# Patient Record
Sex: Male | Born: 1950 | Race: White | Hispanic: No | Marital: Married | Smoking: Never smoker
Health system: Western US, Academic
[De-identification: ages and names within clinical notes are randomized; demographics above are authoritative.]

## PROBLEM LIST (undated history)

## (undated) DIAGNOSIS — C61 Malignant neoplasm of prostate: Secondary | ICD-10-CM

## (undated) HISTORY — PX: NASAL SEPTOPLASTY: SHX2070B

## (undated) HISTORY — DX: Malignant neoplasm of prostate (CMS-HCC): C61

## (undated) HISTORY — PX: SUPRAPUBIC TUBE PLACEMENT: SHX2473

## (undated) HISTORY — PX: OTHER PROCEDURE: U1053

## (undated) SURGERY — CYSTOSCOPY

## (undated) MED ORDER — LIDOCAINE HCL 2% EX GEL (UROJET)
10.00 mL | Freq: Once | Status: AC
Start: 2017-09-24 — End: 2017-09-24

## (undated) MED ORDER — HYDROMORPHONE HCL 1 MG/ML IJ SOLN
.50 mg | INTRAMUSCULAR | Status: AC | PRN
Start: 2017-04-16 — End: 2017-04-17

## (undated) MED ORDER — NALOXONE HCL 0.4 MG/ML IJ SOLN
.10 mg | INTRAMUSCULAR | Status: AC | PRN
Start: 2017-04-16 — End: ?

## (undated) MED ORDER — LIDOCAINE HCL 2% EX GEL (UROJET)
10.00 mL | Freq: Once | Status: AC
Start: 2015-09-16 — End: 2015-09-16

## (undated) MED ORDER — LIDOCAINE, BUFFERED 1% IJ SOLN (COMPOUNDED)
1.00 mL | Freq: Once | INTRADERMAL | Status: AC
Start: 2015-09-16 — End: 2015-09-16

## (undated) MED ORDER — SODIUM CHLORIDE 0.9% TKO INFUSION
INTRAVENOUS | Status: AC | PRN
Start: 2017-04-16 — End: ?

## (undated) MED ORDER — SODIUM CHLORIDE 0.9 % IJ SOLN
3.00 mL | INTRAMUSCULAR | Status: AC | PRN
Start: 2017-04-16 — End: ?

## (undated) MED ORDER — HYDROCODONE-ACETAMINOPHEN 5-325 MG OR TABS
0.50 | ORAL_TABLET | Freq: Three times a day (TID) | ORAL | 0 refills | Status: AC | PRN
Start: 2017-04-12 — End: ?

## (undated) MED ORDER — LACTATED RINGERS IV SOLN
INTRAVENOUS | Status: AC
Start: 2016-06-29 — End: ?

## (undated) MED ORDER — LIDOCAINE HCL (PF) 1 % IJ SOLN
0.10 mL | INTRAMUSCULAR | Status: AC | PRN
Start: 2016-06-29 — End: ?

## (undated) MED ORDER — PANTOPRAZOLE SODIUM 40 MG IV SOLR
40.00 mg | Freq: Every day | INTRAVENOUS | Status: AC
Start: 2017-04-16 — End: ?

## (undated) MED ORDER — SODIUM CHLORIDE 0.9 % IJ SOLN
3.00 mL | Freq: Three times a day (TID) | INTRAMUSCULAR | Status: AC
Start: 2017-04-16 — End: ?

## (undated) MED ORDER — FINASTERIDE 5 MG OR TABS
5.00 mg | ORAL_TABLET | Freq: Every day | ORAL | 0 refills | Status: AC
Start: 2017-06-11 — End: ?

## (undated) MED ORDER — ONDANSETRON HCL 4 MG/2ML IV SOLN
4.00 mg | Freq: Four times a day (QID) | INTRAMUSCULAR | Status: AC | PRN
Start: 2017-04-16 — End: ?

## (undated) MED ORDER — SODIUM CHLORIDE 0.9 % IV SOLN
INTRAVENOUS | Status: AC
Start: 2017-04-16 — End: ?

## (undated) MED ORDER — HYDROMORPHONE HCL 1 MG/ML IJ SOLN
1.00 mg | INTRAMUSCULAR | Status: AC | PRN
Start: 2017-04-16 — End: 2017-04-17

---

## 2015-07-15 ENCOUNTER — Telehealth (HOSPITAL_BASED_OUTPATIENT_CLINIC_OR_DEPARTMENT_OTHER): Payer: Self-pay | Admitting: Urology

## 2015-07-15 NOTE — Telephone Encounter (Signed)
No records or referral/authorization has been received on behalf of patient yet. Will route to [place].

## 2015-07-15 NOTE — Telephone Encounter (Signed)
Pt calling states he spoke with Dr. Arvid Right assistant and had medical record sent to office. Pt is requesting a call back to schedule appt with Dr. Gwenlyn Saran. Please advice pt at [phone]

## 2015-07-18 ENCOUNTER — Telehealth (HOSPITAL_BASED_OUTPATIENT_CLINIC_OR_DEPARTMENT_OTHER): Payer: Self-pay | Admitting: Urology

## 2015-07-18 NOTE — Telephone Encounter (Signed)
Patient is calling to schedule an appointment with Dr.Kane. I confirmed that the medical records were received by [facility]. She informed me that the patient can be scheduled the first available with Dr.Kane (7/26) or can be seen with Dr.Kader (5/30) or with Dr.Parsonse (6/13). This information was provided to the patient. He will contact his referring MD to get advice. Patient has a HMO insurance. Unable to view records to confirm if an auth was attached.

## 2015-07-18 NOTE — Telephone Encounter (Addendum)
recs that were received have been scanned.  No auth received.

## 2015-07-18 NOTE — Telephone Encounter (Signed)
Pt scheduled with Dr. Nani Skillern, see other encounter.

## 2015-07-19 NOTE — Telephone Encounter (Signed)
Spoke with Sharyn Lull at [employer] (Referring Provider) and states insurance does not need an authorization. Patient has [place].  If any other questions or concerns, their contact number is [phone].

## 2015-07-22 NOTE — Patient Instructions (Addendum)
Please get your labs and schedule your MRI.    We will call you with results.      A Morrison Old, MD, phD, [facility]  Associate Professor of Surgery  Department of Urology    Golden Hurter, RN  [phone]    Please note my phone number is for for non-urgent   clinical questions that can be answered in the next 24 hours.  If you have an urgent need during business                                                hours please call the [place] [place] and have me paged  However, please use paging with discretion as this is for  urgent matters only.    If you have a life threatening emergency please report to your  nearest emergency department.     For Prescription refills please call Nelida Meuse at [phone].        To schedule a future appointment call [facility]   Monday-Friday 8:00- 5:00p.m. (Closed Holidays and Weekends)    AFTER HOURS EMERGENCY NUMBER   [phone]  Ask for the Urologist/Oncologist Physician On-Call.     [facility]: [phone]   Call to schedule, cancel, or reschedule clinic appointments    [place]: [place] [id number]   Call to schedule, cancel, or reschedule chemotherapy appointments.  Infusion Center hours are Monday - Friday 07:30 - 9:30 and   Holidays and Weekends are 8am-6pm    Radiation Oncology: [phone]   Call to schedule, cancel, or reschedule radiation appointments    To Schedule MRI  [phone] or [phone]       Please contact the [facility] Scheduling line at   [phone] to schedule your [place], CT Scans or studies.      Social Worker: Linward Headland, LCSW   Phone: [phone]

## 2015-07-22 NOTE — Progress Notes (Signed)
Reason for visit:   Chief Complaint   Patient presents with   . New Patient   . Prostate Cancer     Demographics:        Date: 07/26/2015        Patient Name: Pedro Shannon       Medical Record #: [id number]       DOB: 1951/02/16       Age: 65 year old       Sex: male    Dicks, [name], [address] [place], [place] [address]  Steward Drone    HPI:    Pedro Shannon is a 65 year old male with no family hx of prostate cancer presenting with elevated PSA, prostate nodule in the setting of a suspicious MRI.    Outside Records:    4K Score 02/26/15:  24% - high risk    Pathology 06/16/15:    A.  L Base  Atypical small acinar proliferation  B.  L Lateral base  Benign prostatic tissue  C.  L Mid  Atypical small acinar proliferation  D.  L Lateral Mid  Benign prostatic tissue  E.  L Apex  Benign prostatic tissue  F.  L Lateral Apex  Benign prostatic tissue  G.  R Base  Benign prostatic tissue  H.  R Lateral base  Benign prostatic tissue  I.  R Mid  Atypical small acinar proliferation  J.  R Lateral Mid  Benign prostatic tissue  K.  R Apex  Benign prostatic tissue  L.  R Lateral Apex  Benign prostatic tissue    PSA    07/15/15 - 2.4  01/06/15 - 4.3  07/01/14 - 3.0  11/19/13 - 3.7  (% free 9%)  12/19/11 - 3.4  08/03/11 - 3.3  12/18/10 - 3.1  06/17/10 - 3.0    MRI 05/30/15 - PIRADS 4 lesion involving left base with possible extracapsular extension     His current LUTS include frequency every 3 hours during day, nocturia of 0-4 times, rare urgency on Flomax, rare incontinence, no dysuria, no hematuria, a weak to adequate  urinary stream with occasional sensation of post void residual.     SS 16/3  SHIM 12    He is  sexually active on bi-mix with good results.    No new onset bone pain, night sweats, fevers, chills or unintended weight loss.    Review of Systems   Constitutional: Negative.    HENT: Negative.    Eyes: Negative.    Respiratory: Negative.    Cardiovascular: Negative.    Gastrointestinal: Negative.       Genitourinary: Negative.    Musculoskeletal: Negative.    Skin: Negative.    Neurological: Negative.    Hematological: Negative.        No past medical history on file.    No past surgical history on file.    Allergies not on file    No current outpatient prescriptions on file.     No current facility-administered medications for this visit.        Social History     Social History   . Marital status: Married     Spouse name: N/A   . Number of children: N/A   . Years of education: N/A     Occupational History   . Not on file.     Social History Main Topics   . Smoking status: Not  on file   . Smokeless tobacco: Not on file   . Alcohol use Not on file   . Drug use: Not on file   . Sexual activity: Not on file     Other Topics Concern   . Not on file     Social History Narrative       Physical Exam:   Vitals:    07/26/15 1416   BP: 144/90   Pulse: 74   Resp: 16   Temp: 98.9 F (37.2 C)   TempSrc: Oral   SpO2: 97%   Weight: 87 kg (191 lb 12.8 oz)   Height: 6' (1.829 m)       GENERAL: The patient is alert, cooperative and in no acute distress.   HEENT: normalcephalic/atraumatic  NECK: midline trachea. No jugular venous distention  PULM: no evidence of labored breathing  GI: soft, NT, ND, no massess.  No hepatosplenomegaly  BACK: No CVAT  EXTREMITES: Joints are normal without redness or swelling.   SKIN: There is no edema or cyanosis.   NEURO: no motor or sensory deficits.  Alert and Oriented x 3    Labs/Diagnostic X-rays:  No results found for: WBC, RBC, HGB, HCT, MCV, MCHC, RDW, PLT, MPV, SEGS, LYMPHS, MONOS, EOS, BASOS    No results found for: BUN, CREAT, CL, NA, K, [place], TBILI, ALB, TP, AST, ALK, BICARB, ALT, GLU    No results found for: PSA    No results found for: TESTOSTERONE    No results found for: COLORUA, APPEARUA, [place], [place], [place], [place], [place], [place], [place], UROBILUA, NITRITEUA, [address] [place] [address], [place], RBCUA, EPITHCELLSUA, HYALINEUA, CRYSTALSUA, COMMENTSUA      Assessment/Plan:     Pedro Shannon is a 65 year old male with no family hx of prostate cancer presenting with elevated PSA, prostate nodule in the setting of a suspicious MRI.    PVR today is 43cc    We will repeat his MRI and if this shows the same results we will proceed to MRI Guided prostate biopsy.      Thank you for allowing me to participate in this patient's care.   Sincerely,   Patty Sermons Marylee Belzer

## 2015-07-26 ENCOUNTER — Other Ambulatory Visit (HOSPITAL_BASED_OUTPATIENT_CLINIC_OR_DEPARTMENT_OTHER): Payer: HMO

## 2015-07-26 ENCOUNTER — Encounter (HOSPITAL_BASED_OUTPATIENT_CLINIC_OR_DEPARTMENT_OTHER): Payer: Self-pay | Admitting: Urology

## 2015-07-26 ENCOUNTER — Encounter: Payer: Self-pay | Admitting: Hospital

## 2015-07-26 ENCOUNTER — Ambulatory Visit: Payer: HMO | Attending: Urology | Admitting: Urology

## 2015-07-26 VITALS — BP 144/90 | HR 74 | Temp 98.9°F | Resp 16 | Ht 72.0 in | Wt 191.8 lb

## 2015-07-26 DIAGNOSIS — N402 Nodular prostate without lower urinary tract symptoms: Secondary | ICD-10-CM | POA: Insufficient documentation

## 2015-07-26 DIAGNOSIS — C61 Malignant neoplasm of prostate: Secondary | ICD-10-CM | POA: Insufficient documentation

## 2015-07-26 DIAGNOSIS — R972 Elevated prostate specific antigen [PSA]: Principal | ICD-10-CM | POA: Insufficient documentation

## 2015-07-26 LAB — CREATININE W/GFR: Creatinine: 0.89 mg/dL (ref 0.67–1.17)

## 2015-07-26 LAB — GFR: GFR: >60 mL/min

## 2015-07-26 LAB — PSA ULTRASENSITIVE: PSA: 2.92 ng/mL (ref 0.00–3.99)

## 2015-07-29 ENCOUNTER — Telehealth (HOSPITAL_BASED_OUTPATIENT_CLINIC_OR_DEPARTMENT_OTHER): Payer: Self-pay

## 2015-07-29 NOTE — Telephone Encounter (Signed)
Pt is requesting PSA results:    PSA 2.92     I left a message with MRI Prostate scheduling lien information:  [phone]

## 2015-08-01 ENCOUNTER — Telehealth (INDEPENDENT_AMBULATORY_CARE_PROVIDER_SITE_OTHER): Payer: Self-pay | Admitting: Urology

## 2015-08-01 NOTE — Telephone Encounter (Signed)
We contacted the patients HMO insurance and they stated that even if the pt has a referral to a specialist in [place], the referral would need to come from the patients PCP and they imaging would be approved for a contracted site.  REFERENCE # [id number].      I will notify the pt and let him know to contact his PCP.    Thanks,  ~ Maudie Mercury

## 2015-08-03 NOTE — Telephone Encounter (Signed)
I called [name] regarding his PSA and to see if he has scheduled his MRI.  He has not scheduled due to his HMO requiring a referral from his PCP.  The pt was instructed to reach out to his PCP for a referral and let me know if the PCP needs any documentation from our office.  He will call back when he has his MRI scheduled.    Results for Pedro, Shannon (MRN [id number]) as of 08/03/2015 10:08   Ref. Range 07/26/2015 15:04   PSA Latest Ref Range: 0.00 - 3.99 ng/mL 2.92

## 2015-08-04 NOTE — Telephone Encounter (Signed)
Spoke to patient's PCP office (Dr. Cleopatra Cedar).  Faxed over Dr. Candi Leash progress note and MRI order via EPIC fax

## 2015-08-04 NOTE — Telephone Encounter (Signed)
PCP info:    Dr. Lanier Clam.  [phone]  F.  [phone]

## 2015-08-09 NOTE — Telephone Encounter (Signed)
Dr. Doneen Poisson office called stating in order for the patient rto get approved for MRI through [facility] we have to fax them the MRI denial notice from the insurance company.  Dr. Doneen Poisson office will then notify the insurance company that the patient is getting treated by urologist and needs MRI done at [facility].    Please call [phone] if any questions  Fax [phone]

## 2015-08-10 NOTE — Telephone Encounter (Signed)
Recvd message from RN below. No denial has been recived by [facility]. Will send message to Kim       ===View-only below this line===    ----- Message -----     From: Channing Mutters, RN     Sent: 08/09/2015   6:10 PM       To: Dannial Monarch    Please see latest Epic message.  Did we get a denial for the MRI?  If so if needs to be forwarded to the PCP    Thanks,

## 2015-08-11 ENCOUNTER — Telehealth (HOSPITAL_BASED_OUTPATIENT_CLINIC_OR_DEPARTMENT_OTHER): Payer: Self-pay

## 2015-08-11 NOTE — Telephone Encounter (Signed)
Pt left message asking about the coordination of possible MRI.  He is urgently asking for a call back.[phone]

## 2015-08-12 NOTE — Telephone Encounter (Signed)
I spoke with the pt who is very frustrated with the situation regarding his MRI.  The auth is under pended review and I have reached out to the [facility] twice.  Will route to them again.

## 2015-08-15 NOTE — Telephone Encounter (Signed)
I have called patient on his preferred number listed [phone]. There was no answer, i left pt a message to return my call to see if i can assist.

## 2015-08-17 NOTE — Telephone Encounter (Signed)
I called Pedro Shannon to give him the quote for cash pay for his MRI which is 2,317.15 which is discounted 45 % for cash pay.  We continue to wait for approval or denial from his HMO.  We had a verbal denial which was not accepted by the PCP but they wanted a firm written denial so that was sent two days ago, pending review.  He will wait to see what the insurance says and then possibly we can do a peer to peer.

## 2015-08-18 ENCOUNTER — Telehealth (HOSPITAL_BASED_OUTPATIENT_CLINIC_OR_DEPARTMENT_OTHER): Payer: Self-pay

## 2015-08-18 NOTE — Telephone Encounter (Signed)
Pt lvm stating he got approval for the MRI at [facility].    Auth # [id number]    Pt wants to know what he should do next, please advise.    Please call patient back

## 2015-08-19 NOTE — Telephone Encounter (Signed)
LMOVM.  Pt has finally been approved for his MRI.  Left the number for radiology scheduling on his VM.  He will need f/u with Dr. Nani Skillern for POC depending on his results, possible bx.

## 2015-08-25 ENCOUNTER — Inpatient Hospital Stay (INDEPENDENT_AMBULATORY_CARE_PROVIDER_SITE_OTHER): Admit: 2015-08-25 | Discharge: 2015-08-25 | Disposition: A | Discharge status: Home / Self Care | Payer: HMO

## 2015-08-25 DIAGNOSIS — R972 Elevated prostate specific antigen [PSA]: Principal | ICD-10-CM

## 2015-09-07 ENCOUNTER — Telehealth (HOSPITAL_BASED_OUTPATIENT_CLINIC_OR_DEPARTMENT_OTHER): Payer: Self-pay

## 2015-09-07 NOTE — Telephone Encounter (Signed)
Patient left VMs requesting for clinician to discuss MRI results over the phone. Per pt, completed MRI report is being mailed to his home. He expressed some frustration with the lengthy process and would like to discuss follow-up appointments and option plans as soon as possible. CM unable to reach patient. Left message for patient.

## 2015-09-12 NOTE — Telephone Encounter (Signed)
I have called pt on his preferred number listed. There was no answer, i left pt message to return the call to confirm his new appt with Dr. Nani Skillern

## 2015-09-13 NOTE — Telephone Encounter (Signed)
Recvd call back from patient and confirmed appt with Dr Nani Skillern on 09/16/15 at 4:30pm. Pt was offered appt today but declined.

## 2015-09-15 NOTE — Progress Notes (Signed)
Reason for visit:   Chief Complaint   Patient presents with   . Elevated Psa     Demographics:        Date: 07/26/2015        Patient Name: Pedro Shannon       Medical Record #: [id number]       DOB: 11-Feb-1951       Age: 65 year old       Sex: male    Dicks, [name], [address] [place], [place] [address]  [place]one    HPI:    Pedro Shannon is a 65 year old male with no family hx of prostate cancer presenting with elevated PSA, prostate nodule in the setting of a suspicious MRI.    MRI PELVIS WO/W CONTRAST 08/25/15 reviewed personally:  1. Two PI-RADS 3 lesions, in the left prostatic transition zone mid-gland to   base, and in the right prostatic mid-gland, both possibly also involving the   peripheral zone.    2. No definite extra-prostatic extension.    3. Multiple incidental findings including bilateral renal hernias, bladder   diverticula, bladder wall thickening, and colonic diverticuli.    Outside Records:  4K Score 02/26/15:  24% - high risk    Pathology 06/16/15:  A.  L Base  Atypical small acinar proliferation  B.  L Lateral base  Benign prostatic tissue  C.  L Mid  Atypical small acinar proliferation  D.  L Lateral Mid  Benign prostatic tissue  E.  L Apex  Benign prostatic tissue  F.  L Lateral Apex  Benign prostatic tissue  G.  R Base  Benign prostatic tissue  H.  R Lateral base  Benign prostatic tissue  I.  R Mid  Atypical small acinar proliferation  J.  R Lateral Mid  Benign prostatic tissue  K.  R Apex  Benign prostatic tissue  L.  R Lateral Apex  Benign prostatic tissue    PSA    07/15/15 - 2.4  01/06/15 - 4.3  07/01/14 - 3.0  11/19/13 - 3.7  (% free 9%)  12/19/11 - 3.4  08/03/11 - 3.3  12/18/10 - 3.1  06/17/10 - 3.0    MRI 05/30/15 - PIRADS 4 lesion involving left base with possible extracapsular extension     His current LUTS include frequency every 3 hours during day, nocturia of 0-4 times, rare urgency on Flomax, rare incontinence, no dysuria, no hematuria, a weak to adequate  urinary stream with occasional sensation of post void residual.     SS 16/3  SHIM 12    He is  sexually active on bi-mix with good results.    No new onset bone pain, night sweats, fevers, chills or unintended weight loss.    Review of Systems   Constitutional: Negative.    HENT: Negative.    Eyes: Negative.    Respiratory: Negative.    Cardiovascular: Negative.    Gastrointestinal: Negative.    Genitourinary: Negative.    Musculoskeletal: Negative.    Skin: Negative.    Neurological: Negative.    Hematological: Negative.        Past Medical History:   Diagnosis Date   . Prostate cancer (CMS-HCC)        Past Surgical History:   Procedure Laterality Date   . NASAL SEPTOPLASTY     . R inguinal hernia         Allergies not  on file    No current outpatient prescriptions on file.     No current facility-administered medications for this visit.        Social History     Social History   . Marital status: Married     Spouse name: N/A   . Number of children: N/A   . Years of education: N/A     Occupational History   . Not on file.     Social History Main Topics   . Smoking status: Not on file   . Smokeless tobacco: Not on file   . Alcohol use Not on file   . Drug use: Not on file   . Sexual activity: Not on file     Other Topics Concern   . Not on file     Social History Narrative   . No narrative on file       Physical Exam:   Vitals:    09/16/15 1323   BP: 129/76   BP cuff site: Left;Upper;Arm   BP Patient Position: Sitting   BP cuff size: Large   Pulse: 62   Resp: 17   Temp: 97.7 F (36.5 C)   TempSrc: Oral   SpO2: 98%   Weight: 85.3 kg (188 lb 0.8 oz)   Height: 6' (1.829 m)       GENERAL: The patient is alert, cooperative and in no acute distress.   HEENT: normalcephalic/atraumatic  NECK: midline trachea. No jugular venous distention  PULM: no evidence of labored breathing  GI: soft, NT, ND, no massess.  No hepatosplenomegaly  BACK: No CVAT  EXTREMITES: Joints are normal without redness or swelling.   SKIN: There is no  edema or cyanosis.   NEURO: no motor or sensory deficits.  Alert and Oriented x 3    Labs/Diagnostic X-rays:  No results found for: WBC, RBC, HGB, HCT, MCV, MCHC, RDW, PLT, MPV, SEGS, LYMPHS, MONOS, EOS, BASOS    Lab Results   Component Value Date    CREAT 0.89 07/26/2015       Lab Results   Component Value Date    PSA 2.92 07/26/2015       No results found for: TESTOSTERONE    No results found for: COLORUA, APPEARUA, [place], [place], [place], [place], [place], [place], [place], UROBILUA, [place], [place], [place], RBCUA, EPITHCELLSUA, HYALINEUA, CRYSTALSUA, COMMENTSUA      Assessment/Plan:     Pedro Shannon is a 65 year old male with no family hx of prostate cancer presenting with elevated PSA, prostate nodule in the setting of a suspicious MRI.    PVR today is 43cc    We will repeat his MRI and if this shows the same results we will proceed to MRI Guided prostate biopsy.      Thank you for allowing me to participate in this patient's care.   Sincerely,   Patty Sermons Ardie Mclennan    0

## 2015-09-15 NOTE — Patient Instructions (Addendum)
Transrectal Needle Biopsy of the Prostate    Our office will be calling you regarding your upcoming biopsy.    Please note important instructions listed below:  This test must be performed with a clean bowel.  It is very important that the following be done prior to your appointment:     Discontinue taking aspirin/blood thinning medication and/or anti-inflammatory drugs at least 7 days before the procedure (see list below).  Tylenol is a suitable alternative for pain relief.  If you take aspirin or anti-inflammatory drugs on a regular basis, you may resume taking them 3 days after the biopsy.  For those who take blood thinning medication, please confirm with the nurse on when you can resume this medication.     On the day of your appointment, you must use a Fleet enema at least 1 hour before leaving your home.  You may purchase a Fleet's enema at your local pharmacy.     You may have a light breakfast that morning.       You may be ordered a prescription for Cipro.  You will take one pill twice a day for 3 days.  Start the day before, the day of and the day after your biopsy.  Not all doctors are ordering antibiotics for their patients so check with your nurse to see if you have an antibiotic to take.      DO NOT TAKE THE FOLLOWING MEDICATIONS 7 DAYS BEFORE YOUR PROCEDURE  The following medications contain ingredients that may interfere with your blood's ability to clot.  They either contain aspirin or are arthritis medications. Continue to take any other medications you may be taking under the direction of your physician.Actabs  Advil  Alka Seltzer  Aluprin  Anacin  Anaprox  A.P.C.  Arthralgen  Arthritis Pain Formula  Ascriptin  Aspergum  Aspirin  Bayer   Bayer Timed Release  Bufferin  Bufferin Arthritis  Codasa  Cogesic  Comeback  Congesprin  Cope  Coricidin  Cosprin  Darvon Compound  Dolor  Dristan  Duradyn  Duragesic  Duramed  Ectotrin  Empirim  Encaprin  Equagesic  Excedrin  Feldene  Fenoprofen  Fiorinal   Formagesic  4-Way Cold Tabs  Ibuprofen  Indocin  Indomethacin  Liquaprin  Measurin  Motrin  Nalfon  Naprosyn  Norgesic  Nuprin  Nyquil  Nytol  Oxycodone  Oxyphenbutazone  Percodan  Persistin  Phenaphen  Phenylbutazone Plavix  Premysyn  Propoxphene & Aspirin  Quiet World  Resolve  Robaxisal  Rufen  Sine-Aid  Sine-Off  Sodium Salicylate  Sominex  Stanback  Sure Sleep  Talwin Compound  Tandearil  Vanquish    *Persantine  *Coumadin  *Heparin   *Consult the prescribing physician before discontinuing.      After the Procedure   After the biopsy you should stay off your feet and rest for the remainder of the day.  We recommend that you drink 12-18 oz. of water every hour and try to urinate every 2 hours until bedtime.  You may resume normal activities the following day, provided you feel well and have clear, yellow urine.    Following the procedure, you may feel some urinary tract irritation (a feeling like you need to urinate) and your urine may be a slightly red.  These are normal and should pass within 8-12 hours.    Rectal bleeding may occur with bowel movements.  This is normal and should pass within 8-12 hours.      Your ejaculate may   contain blood, causing it to be red or brown for several weeks following the procedure.    The major concern after a needle biopsy of the prostate is bleeding from the biopsy site into the bladder.  Call your doctor if you experience any of the following:   bright red blood in your urine   you are not able to urinate    a temperature is over 100F   heavy rectal bleeding                A Karim Kader, MD, phD, FRCSC  Associate Professor of Surgery  Department of Urology    Connie McGuire, RN  [phone]    Please note my phone number is for for non-urgent   clinical questions that can be answered in the next 24 hours.  If you have an urgent need during business                                                hours please call the Cancer Center and have me paged  However, please use  paging with discretion as this is for  urgent matters only.    If you have a life threatening emergency please report to your  nearest emergency department.     For Prescription refills please call [facility] at [phone].        To schedule a future appointment call [phone]            [facility]   Monday-Friday 8:00- 5:00p.m. (Closed Holidays and Weekends)    AFTER HOURS EMERGENCY NUMBER   [phone]  Ask for the Urologist/Oncologist Physician On-Call.     [facility] Center Clinic: [phone]   Call to schedule, cancel, or reschedule clinic appointments    Infusion Center: [phone]   Call to schedule, cancel, or reschedule chemotherapy appointments.  Infusion Center hours are Monday - Friday 07:30 - 9:30 and   Holidays and Weekends are 8am-6pm    Radiation Oncology: [phone]   Call to schedule, cancel, or reschedule radiation appointments    To Schedule MRI  [phone] or [phone]       Please contact the [facility] Scheduling line at   [phone] to schedule your US, CT Scans or studies.      Social Worker: Yuko Abbott, LCSW   Phone: [phone]

## 2015-09-16 ENCOUNTER — Ambulatory Visit: Payer: Commercial Managed Care - HMO | Attending: Urology | Admitting: Urology

## 2015-09-16 VITALS — BP 129/76 | HR 62 | Temp 97.7°F | Resp 17 | Ht 72.0 in | Wt 188.1 lb

## 2015-09-16 DIAGNOSIS — C61 Malignant neoplasm of prostate: Principal | ICD-10-CM | POA: Insufficient documentation

## 2015-09-16 MED ORDER — CIPROFLOXACIN HCL 500 MG OR TABS
500.00 mg | ORAL_TABLET | Freq: Two times a day (BID) | ORAL | 0 refills | Status: AC
Start: 2015-09-16 — End: 2015-09-19

## 2015-09-27 ENCOUNTER — Telehealth (HOSPITAL_BASED_OUTPATIENT_CLINIC_OR_DEPARTMENT_OTHER): Payer: Self-pay

## 2015-09-27 NOTE — Telephone Encounter (Signed)
I have called pt on his preferred number listed [phone]. There was no answer, I left pt a message to return my call to coordinate his MRI Guided Biopsy. No date holding.

## 2015-10-05 ENCOUNTER — Telehealth (HOSPITAL_BASED_OUTPATIENT_CLINIC_OR_DEPARTMENT_OTHER): Payer: Self-pay | Admitting: Urology

## 2015-10-05 NOTE — Telephone Encounter (Signed)
I received a call from [name] because he has not been contacted to schedule his bx.  Janett Billow had left a VM requesting a call back but [place] and [place] not receive.  Will route to [facility] to follow up for scheduling.

## 2015-10-07 NOTE — Telephone Encounter (Signed)
Pt returning call and elected to sch procedure 9.1.17 check in at 930am for 10am at [facility] with Dr Nani Skillern.  Pt stated Marlowe Kays already gave him bowel prep and medication instructions.    Pt had no other questions.

## 2015-10-26 ENCOUNTER — Encounter (HOSPITAL_BASED_OUTPATIENT_CLINIC_OR_DEPARTMENT_OTHER): Payer: Self-pay

## 2015-10-28 ENCOUNTER — Ambulatory Visit
Admission: RE | Admit: 2015-10-28 | Discharge: 2015-10-28 | Disposition: A | Discharge status: Home / Self Care | Payer: Commercial Managed Care - HMO | Attending: Urology | Admitting: Urology

## 2015-10-28 ENCOUNTER — Ambulatory Visit (HOSPITAL_BASED_OUTPATIENT_CLINIC_OR_DEPARTMENT_OTHER): Payer: Commercial Managed Care - HMO | Admitting: Urology

## 2015-10-28 VITALS — BP 129/77 | HR 64 | Temp 97.7°F | Resp 14 | Ht 72.0 in | Wt 188.0 lb

## 2015-10-28 DIAGNOSIS — D291 Benign neoplasm of prostate: Secondary | ICD-10-CM

## 2015-10-28 DIAGNOSIS — C61 Malignant neoplasm of prostate: Principal | ICD-10-CM | POA: Insufficient documentation

## 2015-10-28 MED ORDER — CEFTRIAXONE 1 GM IN 1% LIDOCAINE IM INJ (COMPOUNDED)
1000.00 mg | Freq: Once | Status: AC
Start: 2015-10-28 — End: 2015-10-28
  Administered 2015-10-28: 1000 mg via INTRAMUSCULAR
  Filled 2015-10-28 (×2): qty 2.22

## 2015-10-28 MED ORDER — LIDOCAINE HCL 2% EX GEL (UROJET)
10.00 mL | Freq: Once | Status: AC
Start: 2015-10-28 — End: 2015-10-28
  Administered 2015-10-28: 10 mL via RECTAL
  Filled 2015-10-28 (×2): qty 10

## 2015-10-28 MED ORDER — LIDOCAINE, BUFFERED 1% IJ SOLN (COMPOUNDED)
1.00 mL | Freq: Once | INTRADERMAL | Status: AC
Start: 2015-10-28 — End: 2015-10-28
  Administered 2015-10-28: 10 mL via RECTAL
  Filled 2015-10-28 (×2): qty 20

## 2015-10-28 NOTE — Procedures (Signed)
Dictating Practitioner: Lucretia Roers , M.D., Ph.D.  Staff Physician: Lucretia Roers , M.D., Ph.D.    PREOPERATIVE DIAGNOSIS: Elevated PSA  POSTOPERATIVE DIAGNOSIS: Elevated PSA  PROCEDURE: MRI guided transrectal ultrasound-guided prostate biopsy.    INDICATION FOR PROCEDURE: Pedro Shannon is a 65 year old man. Informed consent was obtained and all questions were answered prior to proceeding with the biopsy.    Preop PSA:   Lab Results   Component Value Date    PSA 2.92 07/26/2015     TRUS Volume: 50cc   DRE: Firm left side    DESCRIPTION OF PROCEDURE: After confirming that he had received preoperative antibiotics, stopped anticoagulation and taken a fleet enema, a procedure time-out was performed. He was then placed in the lateral decubitus position and a rectal exam was performed. Trans rectal ultrasound was then performed using an 8 MHz ultrasound probe. There were no hypoechoic regions noted. Images of the patient's prostate were saved and stored. The prostate was anesthetized by injecting approximately 12 mL of 1% Xylocaine without epinephrine in the neurovascular space on either side. The MRI image was then fused to the [place] image and 2 biopsies were taken of each of the 2 targeted lesions. 12 random core needle biopsies were then obtained, one each at the apex, mid and base in the lateral and sextant position. He was given the usual precautions and will follow up in person in 1 week.    A modifier -22 is requested. This case was performed with the [place] equipment, which requires substantial additional work. This required an additional 30 minutes to complete the service.

## 2015-10-28 NOTE — Addendum Note (Signed)
Encounter addended by: Karen Chafe, RN on: 10/28/2015  1:06 PM<BR>     Actions taken: MAR administration accepted

## 2015-10-28 NOTE — Discharge Instructions (Signed)
Patient verbalized understanding of below instructions    Procedure Done Today: Prostate Biopsy     If your MD has ordered imaging studies, please call [phone]      Post Procedure Instructions  After the biopsy you should stay off your feet and rest for the remainder of the day.  We recommend that you drink 12-18oz of water every hour and try to urinate every 2 hours until bedtime. You may resume normal activities the following day provided you feel well and have clear, yellow urine.    Following the procedure, you may feel some urinary tract irritations (a feeling like you need to urinate) and your urine may be slightly red.  These are normal and should pass within 8-12 hours.     Rectal bleeding may occur with bowel movements. This is normal and should pass within 8-12  Hours.    Your ejaculate may contain blood, causing it to be red or brown for several weeks following the procedure.    The major concern after a needle biopsy of the prostate is bleeding from the biopsy site into the bladder. Call your doctor if you experience any of the following:  · Bright red blood in your urine  · You are not able to urinate  · A temperature is over 100F  · Heavy rectal bleeding     FOR DR. KADER  [facility] Oncology Nurse: Connie McGuire   Ph#: [phone]    If you have any questions and it is after business hours of 8am to 4:30pm, please  call the page operator at [phone] and ask to speak with the Urologist on-call.    Please call 5 days after your prostate biopsy to obtain the results.     Call 911 IN CASE OF EMERGENCY

## 2015-11-03 NOTE — Procedures (Signed)
FINAL PATHOLOGIC DIAGNOSIS:  A: Prostate, right lateral apex, biopsy  -Benign prostatic tissue.  -See comment.  B: Prostate, right medial apex, biopsy       -Benign prostatic tissue.  C: Prostate, right lateral mid, biopsy       -Benign fibromuscular tissue.  D: Prostate, right medial mid, biopsy       -Benign prostatic tissue.       -See comment.  E: Prostate, right lateral base, biopsy       -Benign prostatic tissue.  F: Prostate, right medial base, biopsy       -Benign prostatic tissue.  G: Prostate, left lateral apex, biopsy       -Benign prostatic tissue.  H: Prostate, left medial apex, biopsy       -Benign prostatic tissue with mild acute inflammation.  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  -See comment.  J: Prostate, left medial mid, biopsy       -Benign prostatic tissue.  K: Prostate, left lateral base, biopsy       -Benign prostatic tissue.  L: Prostate, left medial base, biopsy       -Benign prostatic tissue.       -Seminal vesicle/ejaculatory duct tissue identified.  M: Prostate, target #1, biopsy       -Benign prostatic tissue.  N: Prostate, target #2, biopsy  -Prostatic tissue with a small focus of atypical glands, cannot exclude  prostatic adenocarcinoma.       -See comment.    COMMENT:  A. Immunohistochemical stains for p63 and CK903 show patchy basal cells in  the glands. In addition, immunohistochemical stains for racemase are weakly  positive. These findings support the above diagnosis.  D. Immunohistochemical stains for p63 and CK903 show patchy basal cells in  the glands. In addition, immunohistochemical stains for racemase are weakly  positive. These findings support the above diagnosis.  I. Immunohistochemical stains for p63 and CK903 are negative in the  cancerous glands. In addition, immunohistochemical stains for racemase are  positive in the cancerous glands.  These findings support the above  diagnosis.  N. Immunohistochemical stains for p63 and CK903 are patchy and negative in  the atypical glands. In addition, immunohistochemical stains for racemase  are weakly positive in the atypical glands. These findings support the  above diagnosis.    SPECIMEN(S) SUBMITTED:  A: Right lateral apex  B: Right medial apex  C: Right lateral mid  D: Right medial mid  E: Right lateral base  F: Right medial base  G: Left lateral apex  H: Left medial apex  I: Left lateral mid  J: Left medial mid  K: Left lateral base  L: Left medial base  M: Target #1  N: Target #2    CLINICAL HISTORY:  Prostate cancer    GROSS DESCRIPTION:  A: The specimen (received in formalin, labeled with the patient's name,  medical record number and "R, lat apex") consists of a pale tan tissue core  measuring 1.9 cm in length and 0.1 cm in diameter. The specimen is stained  with eosin and entirely submitted in cassette A1.  B: The specimen (received in formalin, labeled with the patient's name,  medical record number and "R, med apex") consists of a pale tan tissue core  measuring 1.7 cm in length and 0.1 cm in diameter. The specimen is stained  with eosin and entirely submitted in cassette B1.  C: The specimen (received in formalin, labeled with the patient's name,  medical record number and "R, lat mid") consists of a pale tan tissue core  measuring 1.5 cm in length and 0.1 cm in diameter. The specimen is stained  with eosin and entirely submitted in cassette Cd1.  D: The specimen (received in formalin, labeled with the patient's name,  medical record number and "R, med mid") consists of a pale tan tissue core  measuring 1.5 cm in length and 0.1 cm in diameter. The specimen is stained  with eosin and entirely submitted in cassette D1.  E: The specimen (received in formalin, labeled with the patient's name,  medical record number and "R, lat base") consists of a pale tan tissue core  measuring 1.8 cm in length and  0.1 cm in diameter. The specimen is stained  with eosin and entirely submitted in cassette E1.  F: The specimen (received in formalin, labeled with the patient's name,  medical record number and "R, med base") consists of two pale tan tissue  cores measuring 1.4  and 0.8 cm in length and 0.1 cm in diameter. The  specimen is stained with eosin and entirely submitted in cassette F1.  G: The specimen (received in formalin, labeled with the patient's name,  medical record number and "L, lat apex") consists of a pale tan tissue core  measuring 1.8 cm in length and 0.1 cm in diameter. The specimen is stained  with eosin and entirely submitted in cassette G1.  H: The specimen (received in formalin, labeled with the patient's name,  medical record number and "L, med apex") consists of a pale tan tissue core  measuring 2.0 cm in length and 0.1 cm in diameter. The specimen is stained  with eosin and entirely submitted in cassette H1.  I: The specimen (received in formalin, labeled with the patient's name,  medical record number and "L, lat mid") consists of a pale tan tissue core  measuring 2.5 cm in length and 0.1 cm in diameter. The specimen is stained  with eosin and entirely submitted in cassette I1.  J: The specimen (received in formalin, labeled with the patient's name,  medical record number and "L, med mid") consists of two pale tan tissue  cores measuring 0.3 and 1.3 cm in length and 0.1 cm in diameter. The  specimen is stained with eosin and entirely submitted in cassette J1.  K: The specimen (received in formalin, labeled with the patient's name,  medical record number and "L, lat base") consists of a pale tan tissue core  measuring 1.5 cm in length and 0.1 cm in diameter. The specimen is stained  with eosin and entirely submitted in cassette K1.  L: The specimen (received in formalin, labeled with the patient's name,  medical record number and "L, med base") consists of two pale tan tissue  cores measuring 0.7 and  1.0 cm in length and 0.1 cm in diameter. The  specimen is stained with eosin and entirely submitted in cassette L1.  M: The specimen (received in formalin, labeled with the patient's name,  medical record number and "target #1") consists of four pale tan tissue  cores measuring 0.3 to 1.8 cm in length and 0.1 cm in diameter. The  specimen is stained with eosin and entirely submitted in cassette M1-M2.  N: The specimen (received in formalin, labeled with the patient's name,  medical record number and "target #2") consists of  two pale tan tissue  cores measuring 1.5 and 1.7 cm in length and 0.1 cm in diameter. The  specimen is stained with eosin and entirely submitted in cassette N1.  DW/ma  CONFIDENTIAL HEALTH INFORMATION: Health Care information is personal and  sensitive information. If it is being faxed to you it is done so under  appropriate authorization from the patient or under circumstances that do  not require patient authorization. You, the recipient, are obligated to  maintain it in a safe, secure and confidential manner. Re-disclosure  without additional patient consent or as permitted by law is prohibited.  Unauthorized re-disclosure or failure to maintain confidentiality could  subject you to penalties described in federal and state law.  If you have  received this report or facsimile in error, please notify the [facility] immediately and destroy the received document(s).  SPECIAL STAINS/PROCEDURES:  The following special stains and/or procedures  were used in the final interpretation.  The immunostain(s) reported above were developed and their performance  characteristics determined by the [facility] Department of  Pathology.  They have not been cleared or approved by the U.S. Food and  Drug Administration, although such approval is not required for  analyte-specific reagents of this type.  The FDA has determined that such  clearance is not necessary.  This laboratory is  regulated under the  Clinical Laboratory Improvement Amendments of 1988 (CLIA) as qualified to  perform high complexity clinical testing.  Pursuant to the requirements of  CLIA, this laboratory has established and verified the test's relevant  performance specifications.  Data collected for the verification process  are available upon request.    Material reviewed and Interpreted and  Report Electronically Signed by:  Riley Lam. Lenice Llamas M.D., Ph.D. [phone])  11/03/15 09:42  Electronic Signature derived from a single  controlled access password

## 2015-11-09 NOTE — Progress Notes (Signed)
Reason for visit:   Chief Complaint   Patient presents with   . Prostate Cancer     Demographics:        Date: 11/10/15        Patient Name: Pedro Shannon       Medical Record #: [id number]       DOB: 08-Sep-1950       Age: 65 year old       Sex: male    Dicks, [name] [place], [place] [address]  [address]    HPI:    Pedro Shannon is a 65 year old male with no family hx of prostate cancer presenting with elevated PSA, prostate nodule in the setting of a suspicious MRI, now SP MRI biopsy 10/28/15 for a small focus of Gleason 6 disease.    Lab Results   Component Value Date/Time    PSA 2.92 07/26/2015 03:04 PM        MRI PELVIS WO/W CONTRAST 08/25/15 reviewed personally:  1. Two PI-RADS 3 lesions, in the left prostatic transition zone mid-gland to   base, and in the right prostatic mid-gland, both possibly also involving the   peripheral zone.    2. No definite extra-prostatic extension.    3. Multiple incidental findings including bilateral renal hernias, bladder   diverticula, bladder wall thickening, and colonic diverticuli.    Outside Records:  PSA  07/15/15 - 2.4  01/06/15 - 4.3  07/01/14 - 3.0  11/19/13 - 3.7  (% free 9%)  12/19/11 - 3.4  08/03/11 - 3.3  12/18/10 - 3.1  06/17/10 - 3.0    4K Score 02/26/15:    24% - high risk    Pathology 06/16/15:  A.  L Base  Atypical small acinar proliferation  B.  L Lateral base  Benign prostatic tissue  C.  L Mid  Atypical small acinar proliferation  D.  L Lateral Mid  Benign prostatic tissue  E.  L Apex  Benign prostatic tissue  F.  L Lateral Apex  Benign prostatic tissue  G.  R Base  Benign prostatic tissue  H.  R Lateral base  Benign prostatic tissue  I.  R Mid  Atypical small acinar proliferation  J.  R Lateral Mid  Benign prostatic tissue  K.  R Apex  Benign prostatic tissue  L.  R Lateral Apex  Benign prostatic tissue    MRI 05/30/15:  PIRADS 4 lesion involving left base with possible extracapsular extension       His current LUTS include  frequency every 2-5 hours during day, nocturia of 1-4 times, rare urgency, rare incontinence, no dysuria, no hematuria, a weak to adequate urinary stream with occasional sensation of post void residual.     SS 16/3  SHIM 12    He is  sexually active on bi-mix with good results.    No new onset bone pain, night sweats, fevers, chills or unintended weight loss.    Review of Systems   Constitutional: Negative.    HENT: Negative.    Eyes: Negative.    Respiratory: Negative.    Cardiovascular: Negative.    Gastrointestinal: Negative.    Genitourinary: Negative.    Musculoskeletal: Negative.    Skin: Negative.    Neurological: Negative.    Hematological: Negative.      Wellbeing assessment:  Wellbeing screening reviewed.    Pt indicated no needs    Past Medical  History:   Diagnosis Date   . Prostate cancer (CMS-HCC)        Past Surgical History:   Procedure Laterality Date   . NASAL SEPTOPLASTY     . R inguinal hernia         No Known Allergies    No current outpatient prescriptions on file.     No current facility-administered medications for this visit.        Social History     Social History   . Marital status: Married     Spouse name: N/A   . Number of children: N/A   . Years of education: N/A     Occupational History   . Not on file.     Social History Main Topics   . Smoking status: Not on file   . Smokeless tobacco: Not on file   . Alcohol use Not on file   . Drug use: Not on file   . Sexual activity: Not on file     Other Topics Concern   . Not on file     Social History Narrative   . No narrative on file       Physical Exam:   Vitals:    11/10/15 1001   BP: 130/82   BP cuff site: Left;Upper;Arm   BP Patient Position: Sitting   BP cuff size: Regular   Pulse: 65   Resp: 16   Temp: 97.6 F (36.4 C)   TempSrc: Oral   SpO2: 97%   Weight: 87.4 kg (192 lb 10.9 oz)   Height: 6' (1.829 m)       GENERAL: The patient is alert, cooperative and in no acute distress.   HEENT: normalcephalic/atraumatic  NECK: midline trachea.  No jugular venous distention  PULM: no evidence of labored breathing  GI: soft, NT, ND, no massess.  No hepatosplenomegaly  BACK: No CVAT  EXTREMITES: Joints are normal without redness or swelling.   SKIN: There is no edema or cyanosis.   NEURO: no motor or sensory deficits.  Alert and Oriented x 3    Labs/Diagnostic X-rays:  No results found for: WBC, RBC, HGB, HCT, MCV, MCHC, RDW, PLT, MPV, SEGS, LYMPHS, MONOS, EOS, BASOS    Lab Results   Component Value Date    CREAT 0.89 07/26/2015       Lab Results   Component Value Date    PSA 2.92 07/26/2015       No results found for: TESTOSTERONE    No results found for: COLORUA, APPEARUA, [place], BILIUA, KETONEUA, [place], [place], [place], [place], UROBILUA, NITRITEUA, [place], [place], RBCUA, EPITHCELLSUA, HYALINEUA, CRYSTALSUA, COMMENTSUA      Assessment/Plan:     Pedro Shannon is a 65 year old male with no family hx of prostate cancer presenting with a new diagnosis of low risk prostate cancer.    The patient's treatment options were outlined to him including active surveillance, radiation therapy [including brachytherapy and external beam radiation therapy], and surgery. Mention was made of cryotherapy and high intensity focused ultrasound. The robotic-assisted laparoscopic prostatectomy was outlined in great detail to the patient.      The risks and benefits of the procedure were outlined to the patient. These included but were not limited to:  Impotence - he was quoted a 40-50% chance of impotence    Incontinence - he was given a 90% chance of needing 0-1 pads, a 5% chance of needing 1-2 pads, and a 5% chance of requiring surgical  intervention to manage his incontinence    Cancer control - he was informed about the potential need for adjuvant radiation therapy either due to extracapsular extension, a positive surgical margin, or due to involvement of the seminal vesicles.    Bleeding - with the possible need for blood transfusion    Infection - pneumonia,  urinary tract, wound    Damage to adjacent structures - bowel, rectum [with the potential need for colostomy], obturator nerve, ureters    Anesthetic complications - heart attack, stroke, paralysis and death    He has opted for active surveillance.    He is to f/u in 3 months with PSA.      Thank you for allowing me to participate in this patient's care.   Sincerely,   Patty Sermons Torunn Chancellor

## 2015-11-09 NOTE — Patient Instructions (Signed)
A Karim Kader, MD, phD, FRCSC  Associate Professor of Surgery  Department of Urology    Connie McGuire, RN  [phone]    Please note my phone number is for for non-urgent   clinical questions that can be answered in the next 24 hours.  If you have an urgent need during business                                                hours please call the Cancer Center and have me paged  However, please use paging with discretion as this is for  urgent matters only.    If you have a life threatening emergency please report to your  nearest emergency department.     For Prescription refills please call Wallie Griffith at [phone].        To schedule a future appointment call [phone]            [facility]   Monday-Friday 8:00- 5:00p.m. (Closed Holidays and Weekends)    AFTER HOURS EMERGENCY NUMBER   [phone]  Ask for the Urologist/Oncologist Physician On-Call.     Cancer Center Clinic: [phone]   Call to schedule, cancel, or reschedule clinic appointments    Infusion Center: [phone]   Call to schedule, cancel, or reschedule chemotherapy appointments.  Infusion Center hours are Monday - Friday 07:30 - 9:30 and   Holidays and Weekends are 8am-6pm    Radiation Oncology: [phone]   Call to schedule, cancel, or reschedule radiation appointments    To Schedule MRI  [phone] or [phone]       Please contact the [facility] Scheduling line at   [phone] to schedule your US, CT Scans or studies.      Social Worker: Yuko Abbott, LCSW   Phone: [phone]

## 2015-11-10 ENCOUNTER — Ambulatory Visit: Payer: Commercial Managed Care - HMO | Attending: Urology | Admitting: Urology

## 2015-11-10 VITALS — BP 130/82 | HR 65 | Temp 97.6°F | Resp 16 | Ht 72.0 in | Wt 192.7 lb

## 2015-11-10 DIAGNOSIS — C61 Malignant neoplasm of prostate: Principal | ICD-10-CM | POA: Insufficient documentation

## 2015-12-07 ENCOUNTER — Other Ambulatory Visit (HOSPITAL_BASED_OUTPATIENT_CLINIC_OR_DEPARTMENT_OTHER): Payer: Self-pay | Admitting: Urology

## 2015-12-07 DIAGNOSIS — R399 Unspecified symptoms and signs involving the genitourinary system: Principal | ICD-10-CM

## 2015-12-07 NOTE — Telephone Encounter (Signed)
Pt called and LMOVM asking for a refill on 04. Mg Flomax that was previously ordered by another MD. Pt stated that Dr. Nani Skillern had mentioned possibly having him take two tablets a day but pt stated that he did not think that was necessary. Pt also requested a  Refill for his Bimix be called in to [facility]. Flomax prescription sent to Dr. Nani Skillern to sign and prescription for Bimix called in to [facility].

## 2015-12-09 MED ORDER — TAMSULOSIN HCL 0.4 MG PO CAPS
0.4000 mg | ORAL_CAPSULE | Freq: Every day | ORAL | 3 refills | Status: DC
Start: 2015-12-09 — End: 2016-04-24

## 2016-02-16 ENCOUNTER — Ambulatory Visit (HOSPITAL_BASED_OUTPATIENT_CLINIC_OR_DEPARTMENT_OTHER): Payer: Commercial Managed Care - HMO | Admitting: Urology

## 2016-02-16 NOTE — Progress Notes (Unsigned)
Reason for visit:   Chief Complaint   Patient presents with   . Prostate Cancer   . Elevated Psa     Demographics:        Date: 02/16/16        Patient Name: Pedro Shannon       Medical Record #: [id number]       DOB: 1950/08/01       Age: 65 year old       Sex: male    Dicks, [name], [address] [place], [place] [address]  Ste[address] [employer]one    HPI:    Pedro Shannon is a 65 year old male with no family hx of prostate cancer presenting with elevated PSA, prostate nodule in the setting of a suspicious MRI, now SP MRI biopsy 10/28/15 for a small focus of Gleason 6 disease.    His current LUTS include frequency every 2-5 hours during day, nocturia of 1-4 times, rare urgency, rare incontinence, no dysuria, no hematuria, a weak to adequate urinary stream with occasional sensation of post void residual.     SS 16/3  SHIM 12    He is  sexually active on bi-mix with good results.    No new onset bone pain, night sweats, fevers, chills or unintended weight loss.    Lab Results   Component Value Date/Time    PSA 2.92 07/26/2015 03:04 PM      MRI PELVIS WO/W CONTRAST 08/25/15 reviewed personally:  1. Two PI-RADS 3 lesions, in the left prostatic transition zone mid-gland to   base, and in the right prostatic mid-gland, both possibly also involving the   peripheral zone.    2. No definite extra-prostatic extension.    3. Multiple incidental findings including bilateral renal hernias, bladder   diverticula, bladder wall thickening, and colonic diverticuli.    Outside Records:  PSA  07/15/15 - 2.4  01/06/15 - 4.3  07/01/14 - 3.0  11/19/13 - 3.7  (% free 9%)  12/19/11 - 3.4  08/03/11 - 3.3  12/18/10 - 3.1  06/17/10 - 3.0    4K Score 02/26/15:    24% - high risk    Pathology 06/16/15:  A.  L Base  Atypical small acinar proliferation  B.  L Lateral base  Benign prostatic tissue  C.  L Mid  Atypical small acinar proliferation  D.  L Lateral Mid  Benign prostatic tissue  E.  L Apex  Benign prostatic tissue  F.  L Lateral  Apex  Benign prostatic tissue  G.  R Base  Benign prostatic tissue  H.  R Lateral base  Benign prostatic tissue  I.  R Mid  Atypical small acinar proliferation  J.  R Lateral Mid  Benign prostatic tissue  K.  R Apex  Benign prostatic tissue  L.  R Lateral Apex  Benign prostatic tissue    MRI 05/30/15:  PIRADS 4 lesion involving left base with possible extracapsular extension     Review of Systems   Constitutional: Negative.    HENT: Negative.    Eyes: Negative.    Respiratory: Negative.    Cardiovascular: Negative.    Gastrointestinal: Negative.    Genitourinary: Negative.    Musculoskeletal: Negative.    Skin: Negative.    Neurological: Negative.    Hematological: Negative.      Wellbeing assessment:  Wellbeing screening reviewed.    Pt indicated ***  and requested/declined to speak  with someone/written material.   Pt declined to complete.  Wellbeing screening tool was not given to pt.   Interventions:  Wellbeing screening needs assessed, pt was offered ***/no interventions required.  It was recommended that ***.  Active listening, encouragement.    Past Medical History:   Diagnosis Date   . Prostate cancer (CMS-HCC)        Past Surgical History:   Procedure Laterality Date   . NASAL SEPTOPLASTY     . R inguinal hernia         No Known Allergies    Current Outpatient Prescriptions   Medication Sig Dispense Refill   . tamsulosin (FLOMAX) 0.4 MG capsule Take 1 capsule (0.4 mg) by mouth daily. 90 capsule 3     No current facility-administered medications for this visit.        Social History     Social History   . Marital status: Married     Spouse name: N/A   . Number of children: N/A   . Years of education: N/A     Occupational History   . Not on file.     Social History Main Topics   . Smoking status: Not on file   . Smokeless tobacco: Not on file   . Alcohol use Not on file   . Drug use: Not on file   . Sexual activity: Not on file     Other Topics Concern   . Not on file     Social History Narrative   . No  narrative on file       Physical Exam:   There were no vitals filed for this visit.    GENERAL: The patient is alert, cooperative and in no acute distress.   HEENT: normalcephalic/atraumatic  NECK: midline trachea. No jugular venous distention  PULM: no evidence of labored breathing  GI: soft, NT, ND, no massess.  No hepatosplenomegaly  BACK: No CVAT  EXTREMITES: Joints are normal without redness or swelling.   SKIN: There is no edema or cyanosis.   NEURO: no motor or sensory deficits.  Alert and Oriented x 3    Labs/Diagnostic X-rays:  No results found for: WBC, RBC, HGB, HCT, MCV, MCHC, RDW, PLT, MPV, SEGS, LYMPHS, MONOS, EOS, BASOS    Lab Results   Component Value Date    CREAT 0.89 07/26/2015       Lab Results   Component Value Date    PSA 2.92 07/26/2015       No results found for: TESTOSTERONE    No results found for: COLORUA, APPEARUA, [place], BILIUA, KETONEUA, [place], [place], [place], [place], UROBILUA, NITRITEUA, [place], [place], RBCUA, EPITHCELLSUA, HYALINEUA, CRYSTALSUA, COMMENTSUA      Assessment/Plan:     Pedro Shannon is a 65 year old male with no family hx of prostate cancer presenting with a new diagnosis of low risk prostate cancer.    The patient's treatment options were outlined to him including active surveillance, radiation therapy [including brachytherapy and external beam radiation therapy], and surgery. Mention was made of cryotherapy and high intensity focused ultrasound. The robotic-assisted laparoscopic prostatectomy was outlined in great detail to the patient.      The risks and benefits of the procedure were outlined to the patient. These included but were not limited to:  Impotence - he was quoted a 40-50% chance of impotence    Incontinence - he was given a 90% chance of needing 0-1 pads, a 5% chance of needing 1-2  pads, and a 5% chance of requiring surgical intervention to manage his incontinence    Cancer control - he was informed about the potential need for adjuvant  radiation therapy either due to extracapsular extension, a positive surgical margin, or due to involvement of the seminal vesicles.    Bleeding - with the possible need for blood transfusion    Infection - pneumonia, urinary tract, wound    Damage to adjacent structures - bowel, rectum [with the potential need for colostomy], obturator nerve, ureters    Anesthetic complications - heart attack, stroke, paralysis and death    He has opted for active surveillance.    He is to f/u in 3 months with PSA.      Thank you for allowing me to participate in this patient's care.   Sincerely,   Patty Sermons Kader

## 2016-02-16 NOTE — Patient Instructions (Signed)
A Karim Kader, MD, phD, FRCSC  Associate Professor of Surgery  Department of Urology    Connie McGuire, RN  [phone]    Please note my phone number is for for non-urgent   clinical questions that can be answered in the next 24 hours.  If you have an urgent need during business                                                hours please call the Cancer Center and have me paged  However, please use paging with discretion as this is for  urgent matters only.    If you have a life threatening emergency please report to your  nearest emergency department.     For Prescription refills please call Wallie Griffith at [phone].        To schedule a future appointment call [phone]            [facility]   Monday-Friday 8:00- 5:00p.m. (Closed Holidays and Weekends)    AFTER HOURS EMERGENCY NUMBER   [phone]  Ask for the Urologist/Oncologist Physician On-Call.     Cancer Center Clinic: [phone]   Call to schedule, cancel, or reschedule clinic appointments    Infusion Center: [phone]   Call to schedule, cancel, or reschedule chemotherapy appointments.  Infusion Center hours are Monday - Friday 07:30 - 9:30 and   Holidays and Weekends are 8am-6pm    Radiation Oncology: [phone]   Call to schedule, cancel, or reschedule radiation appointments    To Schedule MRI  [phone] or [phone]       Please contact the [facility] Scheduling line at   [phone] to schedule your US, CT Scans or studies.      Social Worker: Yuko Abbott, LCSW   Phone: [phone]

## 2016-02-22 ENCOUNTER — Telehealth (HOSPITAL_BASED_OUTPATIENT_CLINIC_OR_DEPARTMENT_OTHER): Payer: Self-pay | Admitting: Urology

## 2016-02-22 NOTE — Telephone Encounter (Signed)
In error

## 2016-02-29 NOTE — Progress Notes (Signed)
Reason for visit:   Chief Complaint   Patient presents with    Prostate Cancer     Demographics:        Date: 03/02/16       Patient Name: Pedro Shannon       Medical Record #: [id number]       DOB: 19-Dec-1950       Age: 66 year old       Sex: male    Pedro Shannon, Pedro Shannon, [address] [place], [place] [address]  [address]    HPI:    Pedro Shannon is a 66 year old male with no family hx of prostate cancer presenting with elevated PSA, prostate nodule in the setting of a suspicious MRI, now SP MRI biopsy 10/28/15 for a small focus of Gleason 6 disease.    His current LUTS include frequency every 1-2 hours during day, nocturia q 1.5h times, urgency, occasional incontinence, no dysuria, no hematuria, a weak to adequate urinary stream with occasional sensation of post void residual. On tamsulosin at 0.4 mg daily.    SS 16/5  SHIM 12    He is sexually active on bi-mix with good results. Having some am erections.    No new onset bone pain, night sweats, fevers, chills or unintended weight loss.    Lab Results   Component Value Date/Time    PSA 2.92 07/26/2015 03:04 PM        PSA 3.2 01/09/16.    FINAL PATHOLOGIC DIAGNOSIS 10/28/15 reviewed personally:  A: Prostate, right lateral apex, biopsy  -Benign prostatic tissue.  -See comment.  B: Prostate, right medial apex, biopsy       -Benign prostatic tissue.  C: Prostate, right lateral mid, biopsy       -Benign fibromuscular tissue.  D: Prostate, right medial mid, biopsy       -Benign prostatic tissue.       -See comment.  E: Prostate, right lateral base, biopsy       -Benign prostatic tissue.  F: Prostate, right medial base, biopsy       -Benign prostatic tissue.  G: Prostate, left lateral apex, biopsy       -Benign prostatic tissue.  H: Prostate, left medial apex, biopsy       -Benign prostatic tissue with mild acute inflammation.  I: Prostate, left lateral mid, biopsy  -Adenocarcinoma, Gleason score 3+3=6 ,involving <5% of the length of one  out of one core  (Grade group 1; <1 mm tumor length).  -See comment.  J: Prostate, left medial mid, biopsy       -Benign prostatic tissue.  K: Prostate, left lateral base, biopsy       -Benign prostatic tissue.  L: Prostate, left medial base, biopsy       -Benign prostatic tissue.       -Seminal vesicle/ejaculatory duct tissue identified.  M: Prostate, target #1, biopsy       -Benign prostatic tissue.  N: Prostate, target #2, biopsy  -Prostatic tissue with a small focus of atypical glands, cannot exclude  prostatic adenocarcinoma.       -See comment.    MRI PELVIS WO/W CONTRAST 08/25/15 reviewed personally:  1. Two PI-RADS 3 lesions, in the left prostatic transition zone mid-gland to   base, and in the right prostatic mid-gland, both possibly also involving the   peripheral zone.    2. No definite extra-prostatic extension.    3. Multiple  incidental findings including bilateral renal hernias, bladder   diverticula, bladder wall thickening, and colonic diverticuli.    Outside Records:  PSA  07/15/15 - 2.4  01/06/15 - 4.3  07/01/14 - 3.0  11/19/13 - 3.7  (% free 9%)  12/19/11 - 3.4  08/03/11 - 3.3  12/18/10 - 3.1  06/17/10 - 3.0    4K Score 02/26/15:    24% - high risk    Pathology 06/16/15:  A.  L Base  Atypical small acinar proliferation  B.  L Lateral base  Benign prostatic tissue  C.  L Mid  Atypical small acinar proliferation  D.  L Lateral Mid  Benign prostatic tissue  E.  L Apex  Benign prostatic tissue  F.  L Lateral Apex  Benign prostatic tissue  G.  R Base  Benign prostatic tissue  H.  R Lateral base  Benign prostatic tissue  I.  R Mid  Atypical small acinar proliferation  J.  R Lateral Mid  Benign prostatic tissue  K.  R Apex  Benign prostatic tissue  L.  R Lateral Apex  Benign prostatic tissue    MRI 05/30/15:  PIRADS 4 lesion involving left base with possible extracapsular extension     Review of Systems   Constitutional: Negative.    HENT: Negative.    Eyes: Negative.    Respiratory: Negative.    Cardiovascular: Negative.       Gastrointestinal: Negative.    Genitourinary: Negative.    Musculoskeletal: Negative.    Skin: Negative.    Neurological: Negative.    Hematological: Negative.      Wellbeing assessment:  Wellbeing screening reviewed.    Pt indicated no needs.    Past Medical History:   Diagnosis Date    Prostate cancer (CMS-HCC)        Past Surgical History:   Procedure Laterality Date    NASAL SEPTOPLASTY      R inguinal hernia         No Known Allergies    Current Outpatient Prescriptions   Medication Sig Dispense Refill    tamsulosin (FLOMAX) 0.4 MG capsule Take 1 capsule (0.4 mg) by mouth daily. 90 capsule 3     No current facility-administered medications for this visit.        Social History     Social History    Marital status: Married     Spouse name: N/A    Number of children: N/A    Years of education: N/A     Occupational History    Not on file.     Social History Main Topics    Smoking status: Not on file    Smokeless tobacco: Not on file    Alcohol use Not on file    Drug use: Not on file    Sexual activity: Not on file     Other Topics Concern    Not on file     Social History Narrative    No narrative on file       Physical Exam:   Vitals:    03/02/16 1156   BP: 157/88   BP Location: Left arm   BP Patient Position: Sitting   BP cuff size: Regular   Pulse: 83   Resp: 16   Temp: 98 F (36.7 C)   TempSrc: Oral   SpO2: 97%   Weight: 87.2 kg (192 lb 3.9 oz)   Height: 6' (1.829 m)  GENERAL: The patient is alert, cooperative and in no acute distress.   HEENT: normalcephalic/atraumatic  NECK: midline trachea. No jugular venous distention  PULM: no evidence of labored breathing  GI: soft, NT, ND, no massess.  No hepatosplenomegaly  BACK: No CVAT  EXTREMITES: Joints are normal without redness or swelling.   SKIN: There is no edema or cyanosis.   NEURO: no motor or sensory deficits.  Alert and Oriented x 3    Labs/Diagnostic X-rays:  No results found for: WBC, RBC, HGB, HCT, MCV, MCHC, RDW, PLT, MPV,  SEGS, LYMPHS, MONOS, EOS, BASOS    Lab Results   Component Value Date    CREAT 0.89 07/26/2015       Lab Results   Component Value Date    PSA 2.92 07/26/2015       No results found for: TESTOSTERONE    No results found for: COLORUA, APPEARUA, [place], BILIUA, KETONEUA, [place], [place], [place], [place], UROBILUA, NITRITEUA, [place], [place], RBCUA, EPITHCELLSUA, HYALINEUA, CRYSTALSUA, COMMENTSUA      Assessment/Plan:     Pedro Shannon is a 66 year old male with low risk prostate cancer, bladder diverticulum and 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    Prostate cancer - continue AS   - he is to f/u in 3 months with PSA.    LUTS - increase tamsulosin to 0.8 mg and add dutasteride   - risks and benefits discussed    Diverticulum - natural hx discussed   - urine culture.      Thank you for allowing me to participate in this patient's care.   Sincerely,   Patty Sermons Catalena Stan[place]

## 2016-02-29 NOTE — Patient Instructions (Addendum)
Return to clinic in 3 months with PSA prior.      A Morrison Old, MD, phD, [facility]  Associate Professor of Surgery  Department of Urology    Golden Hurter, RN  [phone]    Please note my phone number is for for non-urgent   clinical questions that can be answered in the next 24 hours.  If you have an urgent need during business                                                hours please call the [facility] and have me paged  However, please use paging with discretion as this is for  urgent matters only.    If you have a life threatening emergency please report to your  nearest emergency department.     For Prescription refills please call Nelida Meuse at [phone].        To schedule a future appointment call [facility]   Monday-Friday 8:00- 5:00p.m. (Closed Holidays and Weekends)    AFTER HOURS EMERGENCY NUMBER   [phone]  Ask for the Urologist/Oncologist Physician On-Call.     [facility]: [phone]   Call to schedule, cancel, or reschedule clinic appointments    [facility]: [place] [id number]   Call to schedule, cancel, or reschedule chemotherapy appointments.  Infusion Center hours are Monday - Friday 07:30 - 9:30 and   Holidays and Weekends are 8am-6pm    Radiation Oncology: [phone]   Call to schedule, cancel, or reschedule radiation appointments    To Schedule MRI  [phone] or [phone]       Please contact the [facility] Scheduling line at   [phone] to schedule your [place], CT Scans or studies.      Social Worker: Linward Headland, LCSW   Phone: [phone]

## 2016-03-02 ENCOUNTER — Ambulatory Visit: Payer: Commercial Managed Care - HMO | Attending: Urology | Admitting: Urology

## 2016-03-02 VITALS — BP 157/88 | HR 83 | Temp 98.0°F | Resp 16 | Ht 72.0 in | Wt 192.2 lb

## 2016-03-02 DIAGNOSIS — N400 Enlarged prostate without lower urinary tract symptoms: Principal | ICD-10-CM | POA: Insufficient documentation

## 2016-03-02 DIAGNOSIS — C61 Malignant neoplasm of prostate: Secondary | ICD-10-CM | POA: Insufficient documentation

## 2016-03-02 MED ORDER — TAMSULOSIN HCL 0.4 MG PO CAPS
0.8000 mg | ORAL_CAPSULE | Freq: Every day | ORAL | 11 refills | Status: DC
Start: 2016-03-02 — End: 2016-04-24

## 2016-03-02 MED ORDER — DUTASTERIDE 0.5 MG OR CAPS
0.5000 mg | ORAL_CAPSULE | Freq: Every day | ORAL | 11 refills | Status: DC
Start: 2016-03-02 — End: 2016-06-29

## 2016-03-04 LAB — URINE CULTURE: Urine Culture Result: NO GROWTH

## 2016-03-20 ENCOUNTER — Telehealth (HOSPITAL_BASED_OUTPATIENT_CLINIC_OR_DEPARTMENT_OTHER): Payer: Self-pay | Admitting: Urology

## 2016-03-20 DIAGNOSIS — C61 Malignant neoplasm of prostate: Principal | ICD-10-CM

## 2016-03-20 MED ORDER — TAMSULOSIN HCL 0.4 MG PO CAPS
0.4000 mg | ORAL_CAPSULE | Freq: Two times a day (BID) | ORAL | 11 refills | Status: DC
Start: 2016-03-20 — End: 2016-04-24

## 2016-03-20 MED ORDER — DUTASTERIDE 0.5 MG OR CAPS
0.5000 mg | ORAL_CAPSULE | Freq: Every day | ORAL | 11 refills | Status: DC
Start: 2016-03-20 — End: 2016-06-29

## 2016-03-20 NOTE — Telephone Encounter (Signed)
Refilled Tamsulosin (double) and [facility]ide [facility].

## 2016-04-20 ENCOUNTER — Telehealth (HOSPITAL_BASED_OUTPATIENT_CLINIC_OR_DEPARTMENT_OTHER): Payer: Self-pay | Admitting: Urology

## 2016-04-20 NOTE — Telephone Encounter (Signed)
Pt states that he has been noticing an itchy rash and sporadic hives since starting the dutasteride.   Advised pt to stop this medication to see if it resolves. He has had similar reactions to laundry detergent and confirmed he has not changed his laundry detergent or sheets.  Denies SOB, trouble swallowing, c/p, wheezing.     F/up appt currently scheduled for 06/07/16, requesting to be seen sooner    Added dutasteride to allergy list and msg forwarded to Dr. Nani Skillern for FYI    Pt is also interested in [facility] as he feels his BPH s/s are getting worse.

## 2016-04-24 ENCOUNTER — Other Ambulatory Visit (HOSPITAL_BASED_OUTPATIENT_CLINIC_OR_DEPARTMENT_OTHER): Payer: Self-pay | Admitting: Urology

## 2016-04-24 DIAGNOSIS — N400 Enlarged prostate without lower urinary tract symptoms: Principal | ICD-10-CM

## 2016-04-24 MED ORDER — TAMSULOSIN HCL 0.4 MG PO CAPS
0.8000 mg | ORAL_CAPSULE | Freq: Every day | ORAL | 3 refills | Status: DC
Start: 2016-04-24 — End: 2017-04-03

## 2016-04-24 NOTE — Telephone Encounter (Signed)
Received call from VM 2/26 at 1633    Pt stated that [facility] not receive updated rx.     Previous jan 2018 rx [facility]    Pharmacy updated per protocol.     Called pt and left msg  Per 03/02/16 note- pt is to increase tamsulosin to 0.8mg  and add dutasteride.

## 2016-04-27 NOTE — Telephone Encounter (Signed)
Per MD's request i have called pt on his preferred number listed. There was no answer, i left pt a message to return the call to confirm his appt with Dr Nani Skillern     Plan:     3/15 at 11:30am at [facility]

## 2016-05-08 ENCOUNTER — Telehealth (HOSPITAL_BASED_OUTPATIENT_CLINIC_OR_DEPARTMENT_OTHER): Payer: Self-pay | Admitting: Urology

## 2016-05-08 ENCOUNTER — Other Ambulatory Visit: Payer: Commercial Managed Care - HMO | Attending: Urology

## 2016-05-08 DIAGNOSIS — N400 Enlarged prostate without lower urinary tract symptoms: Principal | ICD-10-CM | POA: Insufficient documentation

## 2016-05-08 LAB — PSA ULTRASENSITIVE: PSA: 2.12 ng/mL (ref 0.00–3.99)

## 2016-05-08 NOTE — Progress Notes (Signed)
Reason for visit:   Chief Complaint   Patient presents with    Prostate Cancer     Demographics:        Date: 03/02/16       Patient Name: Pedro Shannon       Medical Record #: [id number]       DOB: 12-25-1950       Age: 66 year old       Sex: male    Dicks, [name], [address] [place], [place] [address]  [employer]    HPI:    Pedro Shannon is a 66 year old male with no family hx of prostate cancer presenting with elevated PSA, prostate nodule in the setting of a suspicious MRI, now SP MRI biopsy 10/28/15 for a small focus of Gleason 6 disease.    His current LUTS include frequency every 1 hours during day, nocturia q 2h times, urgency, occasional incontinence/dribbling before and after void, no dysuria, no hematuria, a weak to adequate urinary stream with occasional sensation of post void residual. On tamsulosin at 0.4 mg daily.    He has stopped Dutasteride as it caused hives.    SS 19/4  SHIM 12    He is sexually active on bi-mix with good results. Having some am erections.    No new onset bone pain, night sweats, fevers, chills or unintended weight loss.    Lab Results   Component Value Date/Time    PSA 2.12 05/08/2016 01:15 PM    PSA 2.92 07/26/2015 03:04 PM        PSA 3.2 01/09/16.    FINAL PATHOLOGIC DIAGNOSIS 10/28/15 reviewed personally:  A: Prostate, right lateral apex, biopsy  -Benign prostatic tissue.  -See comment.  B: Prostate, right medial apex, biopsy       -Benign prostatic tissue.  C: Prostate, right lateral mid, biopsy       -Benign fibromuscular tissue.  D: Prostate, right medial mid, biopsy       -Benign prostatic tissue.       -See comment.  E: Prostate, right lateral base, biopsy       -Benign prostatic tissue.  F: Prostate, right medial base, biopsy       -Benign prostatic tissue.  G: Prostate, left lateral apex, biopsy       -Benign prostatic tissue.  H: Prostate, left medial apex, biopsy       -Benign prostatic tissue with mild acute inflammation.  I: Prostate, left  lateral mid, biopsy  -Adenocarcinoma, Gleason score 3+3=6 ,involving <5% of the length of one  out of one core (Grade group 1; <1 mm tumor length).  -See comment.  J: Prostate, left medial mid, biopsy       -Benign prostatic tissue.  K: Prostate, left lateral base, biopsy       -Benign prostatic tissue.  L: Prostate, left medial base, biopsy       -Benign prostatic tissue.       -Seminal vesicle/ejaculatory duct tissue identified.  M: Prostate, target #1, biopsy       -Benign prostatic tissue.  N: Prostate, target #2, biopsy  -Prostatic tissue with a small focus of atypical glands, cannot exclude  prostatic adenocarcinoma.       -See comment.    MRI PELVIS WO/W CONTRAST 08/25/15 reviewed personally:  1. Two PI-RADS 3 lesions, in the left prostatic transition zone mid-gland to   base, and in the right prostatic  mid-gland, both possibly also involving the   peripheral zone.    2. No definite extra-prostatic extension.    3. Multiple incidental findings including bilateral renal hernias, bladder   diverticula, bladder wall thickening, and colonic diverticuli.    Outside Records:  PSA  07/15/15 - 2.4  01/06/15 - 4.3  07/01/14 - 3.0  11/19/13 - 3.7  (% free 9%)  12/19/11 - 3.4  08/03/11 - 3.3  12/18/10 - 3.1  06/17/10 - 3.0    4K Score 02/26/15:    24% - high risk    Pathology 06/16/15:  A.  L Base  Atypical small acinar proliferation  B.  L Lateral base  Benign prostatic tissue  C.  L Mid  Atypical small acinar proliferation  D.  L Lateral Mid  Benign prostatic tissue  E.  L Apex  Benign prostatic tissue  F.  L Lateral Apex  Benign prostatic tissue  G.  R Base  Benign prostatic tissue  H.  R Lateral base  Benign prostatic tissue  I.  R Mid  Atypical small acinar proliferation  J.  R Lateral Mid  Benign prostatic tissue  K.  R Apex  Benign prostatic tissue  L.  R Lateral Apex  Benign prostatic tissue    MRI 05/30/15:  PIRADS 4 lesion involving left base with possible extracapsular extension     Review of Systems   Constitutional:  Negative.    HENT: Negative.    Eyes: Negative.    Respiratory: Negative.    Cardiovascular: Negative.    Gastrointestinal: Negative.    Genitourinary: Positive for frequency and urgency.   Musculoskeletal: Negative.    Skin: Negative.    Allergic/Immunologic: Positive for environmental allergies.        Detergents   Neurological: Negative.    Hematological: Negative.    Psychiatric/Behavioral: Positive for sleep disturbance.     Wellbeing assessment:  Wellbeing screening reviewed.    Pt indicated no needs.    Past Medical History:   Diagnosis Date    Prostate cancer (CMS-HCC)        Past Surgical History:   Procedure Laterality Date    NASAL SEPTOPLASTY      R inguinal hernia         Allergies   Allergen Reactions    Dutasteride Hives       Current Outpatient Prescriptions   Medication Sig Dispense Refill    dutasteride (AVODART) 0.5 MG capsule Take 1 capsule (0.5 mg) by mouth daily. 90 capsule 11    dutasteride (AVODART) 0.5 MG capsule Take 1 capsule (0.5 mg) by mouth daily. 30 capsule 11    tamsulosin (FLOMAX) 0.4 MG capsule Take 2 capsules (0.8 mg) by mouth daily. 180 capsule 3     No current facility-administered medications for this visit.        Social History     Social History    Marital status: Married     Spouse name: N/A    Number of children: N/A    Years of education: N/A     Occupational History    Not on file.     Social History Main Topics    Smoking status: Not on file    Smokeless tobacco: Not on file    Alcohol use Not on file    Drug use: Not on file    Sexual activity: Not on file     Other Topics Concern    Not on file  Social History Narrative    No narrative on file       Physical Exam:   Vitals:    05/10/16 1128   BP: 124/79   BP Location: Left arm   BP Patient Position: Sitting   BP cuff size: Regular   Pulse: 78   Resp: 18   Temp: 96.1 F (35.6 C)   TempSrc: Oral   SpO2: 98%   Weight: 87.1 kg (192 lb)   Height: 6' (1.829 m)       GENERAL: The patient is alert,  cooperative and in no acute distress.   HEENT: normalcephalic/atraumatic  NECK: midline trachea. No jugular venous distention  PULM: no evidence of labored breathing  GI: soft, NT, ND, no massess.  No hepatosplenomegaly  BACK: No CVAT  EXTREMITES: Joints are normal without redness or swelling.   SKIN: There is no edema or cyanosis.   NEURO: no motor or sensory deficits.  Alert and Oriented x 3    Labs/Diagnostic X-rays:  No results found for: WBC, RBC, HGB, HCT, MCV, MCHC, RDW, PLT, MPV, SEGS, LYMPHS, MONOS, EOS, BASOS    Lab Results   Component Value Date    CREAT 0.89 07/26/2015       Lab Results   Component Value Date    PSA 2.12 05/08/2016       No results found for: TESTOSTERONE    No results found for: COLORUA, APPEARUA, [place], BILIUA, KETONEUA, [place], [place], [place], [place], UROBILUA, NITRITEUA, LEUKESTUA, [place], RBCUA, EPITHCELLSUA, HYALINEUA, CRYSTALSUA, COMMENTSUA      Assessment/Plan:     Cristofher Livecchi is a 66 year old male with low risk prostate cancer, bladder diverticulum and 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    Prostate cancer - continue AS   - he has dropping PSAs    LUTS - worsening  - frustrated, would like to pursue rezum  - urine culture    Outlined options including observation/conservative measures, surgery (TURP/TUNA).    He has opted for TUNA.    We will get labs and plan for cystoscopy.    The risks and benefits of the procedure were outlined to the patient. These included but were not limited to:    Symptom control - he was informed about the potential for symptom recurrence    Bladder perforation - with the potential need for laparotomy    Bleeding - with the possible need for blood transfusion    Infection - pneumonia, urinary tract    Damage to adjacent structures - bowel, ureteral orifices (with possible need for a ureteral stent), urethral sphincter    Anesthetic complications - heart attack, stroke, paralysis and death;      Diverticulum - natural hx discussed   -  cystoscopy to assess at the time of rezum      Thank you for allowing me to participate in this patient's care.   Sincerely,   Patty Sermons Decklyn Hyder

## 2016-05-08 NOTE — Patient Instructions (Signed)
A Karim Kader, MD, phD, FRCSC  Professor of Surgery  Department of Urology    Vella Colquitt, RN  Phone:  [phone]  Fax:  [phone]    Please note my phone number is for for non-urgent   clinical questions that can be answered in the next 24 hours.  If you have an urgent need during business                                                hours please call the Cancer Center and have me paged  However, please use paging with discretion as this is for  urgent matters only.  If you have a life threatening emergency please report to your  nearest emergency department.     For Prescription refills please call Rachelyn, LVN at [phone].        Administrative Assistant  Sheree Lalla [phone]  Fax [phone]    To schedule a future appointment call [phone]            [facility]   Monday-Friday 8:00- 5:00p.m. (Closed Holidays and Weekends)    AFTER HOURS EMERGENCY NUMBER                  [phone]  Ask for the Urologist/Oncologist Physician On-Call.      Urology Call Center [phone]    Call to schedule, cancel, or reschedule clinic appointments    Infusion Center [phone]    Call to schedule, cancel, or reschedule chemotherapy appointments.  Infusion Center hours are Monday - Friday 07:30 - 9:30 and   Holidays and Weekends are 8am-6pm    Radiation Oncology [phone]    Call to schedule, cancel, or reschedule radiation appointments    To Schedule MRI  [phone] or [phone]     Please contact the [facility] Scheduling line at   [phone] to schedule your US, CT Scans or studies.      Social Worker: Carolina Ramirez, LCSW   [phone]

## 2016-05-08 NOTE — Telephone Encounter (Signed)
Called pt. Left msg to remind him to have PSA blood work done prior to appt on thurs

## 2016-05-09 NOTE — Telephone Encounter (Signed)
Pt confirmed he had PSA lab done 3/13 results in epic.

## 2016-05-10 ENCOUNTER — Ambulatory Visit: Payer: Commercial Managed Care - HMO | Attending: Urology | Admitting: Urology

## 2016-05-10 VITALS — BP 124/79 | HR 78 | Temp 96.1°F | Resp 18 | Ht 72.0 in | Wt 192.0 lb

## 2016-05-10 DIAGNOSIS — N400 Enlarged prostate without lower urinary tract symptoms: Principal | ICD-10-CM | POA: Insufficient documentation

## 2016-05-10 DIAGNOSIS — N323 Diverticulum of bladder: Secondary | ICD-10-CM | POA: Insufficient documentation

## 2016-05-10 DIAGNOSIS — C61 Malignant neoplasm of prostate: Secondary | ICD-10-CM | POA: Insufficient documentation

## 2016-05-12 LAB — URINE CULTURE: Urine Culture Result: NO GROWTH

## 2016-05-16 ENCOUNTER — Encounter (HOSPITAL_BASED_OUTPATIENT_CLINIC_OR_DEPARTMENT_OTHER): Payer: Self-pay | Admitting: Urology

## 2016-05-30 DIAGNOSIS — N400 Enlarged prostate without lower urinary tract symptoms: Secondary | ICD-10-CM

## 2016-06-01 ENCOUNTER — Telehealth (HOSPITAL_BASED_OUTPATIENT_CLINIC_OR_DEPARTMENT_OTHER): Payer: Self-pay

## 2016-06-01 NOTE — Telephone Encounter (Signed)
I have called pt on his preferred number listed [phone]. There was no answer, i left pt a message to return the call to confirm his surgery with Dr Nani Skillern.     Plan:  [facility]: 4.17.18 at 3pm via phone  DOS: 4.25.18 at 11:15am check in at 9:15 at [facility]

## 2016-06-04 NOTE — Telephone Encounter (Signed)
Pt is calling back state she received a missed call from [place] in regards to message below and is returning her call back. Please review and return call back to [phone] to further assist.

## 2016-06-05 NOTE — Telephone Encounter (Signed)
I have called pt on his preferred number listed [phone]. There was no answer, i left pt a message to return the call to confirm his surgery information on prior message from 4.6.18

## 2016-06-07 ENCOUNTER — Ambulatory Visit: Payer: Commercial Managed Care - HMO | Attending: Urology | Admitting: Urology

## 2016-06-07 VITALS — BP 121/78 | HR 70 | Temp 98.6°F | Resp 16 | Ht 72.0 in | Wt 194.7 lb

## 2016-06-07 DIAGNOSIS — C61 Malignant neoplasm of prostate: Secondary | ICD-10-CM | POA: Insufficient documentation

## 2016-06-07 DIAGNOSIS — N323 Diverticulum of bladder: Secondary | ICD-10-CM | POA: Insufficient documentation

## 2016-06-07 DIAGNOSIS — R399 Unspecified symptoms and signs involving the genitourinary system: Principal | ICD-10-CM | POA: Insufficient documentation

## 2016-06-07 NOTE — Progress Notes (Signed)
Reason for visit:   No chief complaint on file.    Demographics:        Date: 03/02/16       Patient Name: Pedro Shannon       Medical Record #: [id number]       DOB: August 26, 1950       Age: 66 year old       Sex: male    Dicks, [name], [address] [place], [place] [address]  [employer]    HPI:    Pedro Shannon is a 66 year old male with no family hx of prostate cancer presenting with elevated PSA, prostate nodule in the setting of a suspicious MRI, now SP MRI biopsy 10/28/15 for a small focus of Gleason 6 disease.    His current LUTS include frequency every 1 hours during day, nocturia q 2h times, occasional urgency, occasional incontinence/dribbling before and after void, no dysuria, no hematuria, a weak to adequate urinary stream with occasional sensation of post void residual. On tamsulosin at 0.4 mg daily.    He has stopped Dutasteride as it caused hives.    SS 19/4  SHIM 12    He is sexually active on bi-mix with good results. Having some am erections.    No new onset bone pain, night sweats, fevers, chills or unintended weight loss.    Lab Results   Component Value Date/Time    PSA 2.12 05/08/2016 01:15 PM    PSA 2.92 07/26/2015 03:04 PM        PSA 3.2 01/09/16.    FINAL PATHOLOGIC DIAGNOSIS 10/28/15 reviewed personally:  A: Prostate, right lateral apex, biopsy  -Benign prostatic tissue.  -See comment.  B: Prostate, right medial apex, biopsy       -Benign prostatic tissue.  C: Prostate, right lateral mid, biopsy       -Benign fibromuscular tissue.  D: Prostate, right medial mid, biopsy       -Benign prostatic tissue.       -See comment.  E: Prostate, right lateral base, biopsy       -Benign prostatic tissue.  F: Prostate, right medial base, biopsy       -Benign prostatic tissue.  G: Prostate, left lateral apex, biopsy       -Benign prostatic tissue.  H: Prostate, left medial apex, biopsy       -Benign prostatic tissue with mild acute inflammation.  I: Prostate, left lateral mid,  biopsy  -Adenocarcinoma, Gleason score 3+3=6 ,involving <5% of the length of one  out of one core (Grade group 1; <1 mm tumor length).  -See comment.  J: Prostate, left medial mid, biopsy       -Benign prostatic tissue.  K: Prostate, left lateral base, biopsy       -Benign prostatic tissue.  L: Prostate, left medial base, biopsy       -Benign prostatic tissue.       -Seminal vesicle/ejaculatory duct tissue identified.  M: Prostate, target #1, biopsy       -Benign prostatic tissue.  N: Prostate, target #2, biopsy  -Prostatic tissue with a small focus of atypical glands, cannot exclude  prostatic adenocarcinoma.       -See comment.    MRI PELVIS WO/W CONTRAST 08/25/15 reviewed personally:  1. Two PI-RADS 3 lesions, in the left prostatic transition zone mid-gland to   base, and in the right prostatic mid-gland, both possibly also involving the  peripheral zone.    2. No definite extra-prostatic extension.    3. Multiple incidental findings including bilateral renal hernias, bladder   diverticula, bladder wall thickening, and colonic diverticuli.    Outside Records:  PSA  07/15/15 - 2.4  01/06/15 - 4.3  07/01/14 - 3.0  11/19/13 - 3.7  (% free 9%)  12/19/11 - 3.4  08/03/11 - 3.3  12/18/10 - 3.1  06/17/10 - 3.0    4K Score 02/26/15:    24% - high risk    Pathology 06/16/15:  A.  L Base  Atypical small acinar proliferation  B.  L Lateral base  Benign prostatic tissue  C.  L Mid  Atypical small acinar proliferation  D.  L Lateral Mid  Benign prostatic tissue  E.  L Apex  Benign prostatic tissue  F.  L Lateral Apex  Benign prostatic tissue  G.  R Base  Benign prostatic tissue  H.  R Lateral base  Benign prostatic tissue  I.  R Mid  Atypical small acinar proliferation  J.  R Lateral Mid  Benign prostatic tissue  K.  R Apex  Benign prostatic tissue  L.  R Lateral Apex  Benign prostatic tissue    MRI 05/30/15:  PIRADS 4 lesion involving left base with possible extracapsular extension     Review of Systems   Constitutional: Negative.     HENT: Negative.    Eyes: Negative.    Respiratory: Negative.    Cardiovascular: Negative.    Gastrointestinal: Negative.    Genitourinary: Positive for frequency and urgency.   Musculoskeletal: Negative.    Skin: Negative.    Allergic/Immunologic: Positive for environmental allergies.        Detergents   Neurological: Negative.    Hematological: Negative.    Psychiatric/Behavioral: Positive for sleep disturbance.     Wellbeing assessment:  Wellbeing screening reviewed.    Pt indicated no needs.    Past Medical History:   Diagnosis Date    Prostate cancer (CMS-HCC)        Past Surgical History:   Procedure Laterality Date    NASAL SEPTOPLASTY      R inguinal hernia         Allergies   Allergen Reactions    Dutasteride Hives       Current Outpatient Prescriptions   Medication Sig Dispense Refill    dutasteride (AVODART) 0.5 MG capsule Take 1 capsule (0.5 mg) by mouth daily. 90 capsule 11    dutasteride (AVODART) 0.5 MG capsule Take 1 capsule (0.5 mg) by mouth daily. 30 capsule 11    tamsulosin (FLOMAX) 0.4 MG capsule Take 2 capsules (0.8 mg) by mouth daily. 180 capsule 3     No current facility-administered medications for this visit.        Social History     Social History    Marital status: Married     Spouse name: N/A    Number of children: N/A    Years of education: N/A     Occupational History    Not on file.     Social History Main Topics    Smoking status: Not on file    Smokeless tobacco: Not on file    Alcohol use Not on file    Drug use: Not on file    Sexual activity: Not on file     Other Topics Concern    Not on file     Social History Narrative  No narrative on file       Physical Exam:   There were no vitals filed for this visit.    GENERAL: The patient is alert, cooperative and in no acute distress.   HEENT: normalcephalic/atraumatic  NECK: midline trachea. No jugular venous distention  PULM: no evidence of labored breathing  GI: soft, NT, ND, no massess.  No  hepatosplenomegaly  BACK: No CVAT  EXTREMITES: Joints are normal without redness or swelling.   SKIN: There is no edema or cyanosis.   NEURO: no motor or sensory deficits.  Alert and Oriented x 3    Labs/Diagnostic X-rays:  No results found for: WBC, RBC, HGB, HCT, MCV, MCHC, RDW, PLT, MPV, SEGS, LYMPHS, MONOS, EOS, BASOS    Lab Results   Component Value Date    CREAT 0.89 07/26/2015       Lab Results   Component Value Date    PSA 2.12 05/08/2016       No results found for: TESTOSTERONE    No results found for: COLORUA, APPEARUA, [place], BILIUA, KETONEUA, [place], [place], [place], [place], UROBILUA, NITRITEUA, LEUKESTUA, [place], RBCUA, EPITHCELLSUA, HYALINEUA, CRYSTALSUA, COMMENTSUA      Assessment/Plan:     Pedro Shannon is a 66 year old male with low risk prostate cancer, bladder diverticulum and 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    Prostate cancer - continue AS   - he has dropping PSAs    LUTS - worsening  - frustrated, would like to pursue rezum  - urine culture    Outlined options including observation/conservative measures, surgery (TURP/TUNA).    He has opted for TUNA.    We will get labs and plan for cystoscopy.    The risks and benefits of the procedure were outlined to the patient. These included but were not limited to:    Symptom control - he was informed about the potential for symptom recurrence    Bladder perforation - with the potential need for laparotomy    Bleeding - with the possible need for blood transfusion    Infection - pneumonia, urinary tract    Damage to adjacent structures - bowel, ureteral orifices (with possible need for a ureteral stent), urethral sphincter    Anesthetic complications - heart attack, stroke, paralysis and death;      Diverticulum - natural hx discussed   - cystoscopy to assess at the time of rezum      Thank you for allowing me to participate in this patient's care.   Sincerely,   Patty Sermons [place]

## 2016-06-12 ENCOUNTER — Ambulatory Visit (HOSPITAL_BASED_OUTPATIENT_CLINIC_OR_DEPARTMENT_OTHER): Payer: Commercial Managed Care - HMO

## 2016-06-21 NOTE — Telephone Encounter (Signed)
Surgery name:      Will send message to MD and RN to review orders (labs, imaging and cath removal orders) in EPIC to make sure appropriate orders are entered prior to pt's surgery. RN to also confirm with pt that they are aware of request.      Patient has been contacted at Ph. # [phone]. Spoke to patient and confirmed all surgery dates below. Surgical letter, bowel prep, and ASA forms have been emailed to [email or web address] per patients request.     Surgery Date: 5.23.18 Check in time: 9am  [facility]: 5.4.18     Is Medical Clearance needed? no     Cath Removal Order placed: no order in epic     Ostomy RN Referral placed: not needed     Sleep Apnea:  no     Surgical Consent: has been signed and scanned into media      Auth unit will request as time gets closer per auth teams request.      I have informed the patient to please discontinue taking any aspirin/blood thinning medication and/or anti-inflammatory drugs at least 7 days before their scheduled procedure.      Is the patient taking any aspirin/blood thinning medication that is prescribed under a physician's instructions? no     ASPIRIN AND IBUPROFEN CAUTION SHEET  FOR SURGERY PATIENTS     For 7 days prior to surgery, do not take any medication containing aspirin or ibuprofen.  Please refer to the list below for some of the medications that contain aspirin and/or ibuprofen.  If it is necessary for you to take any medication, please use Tylenol or acetaminophen substitutes.     Advil                                                                                Gingkoba  Alka-Seltzer                                                                    Liquiprin  Alleve                                                                              Measurin  Anacin                                                                             Midol  APC  Motrin  ASA compound                                                                 Norgesic  Ascriptin                                                                           Novahistine with APC  Aspergum                                                                        Nuprin  Bufferin                                                                            PAC  CAMA                                                                              Percodan  Capron capsules                                                             Phenaphen  Contact                                                                           Phensol  Cope  Plavix  Coricidin                                                                         Robaxisal  Counterpain                                                                    Sal-Fayne  Daprisal                                                                          Stanback  Darvon compound                                                         Super Anahist  Dolene compound                                                         Synalogos  Dristan                                                                  Talwin compound  Ecotrin                                                                  Trigesic  Edrisal                                                                   Triphen  [employer]  Trilisate  Excedrin                                                                Triaminic  Femcaps                                                                Vanquish  Fiorinal                                                                   Vitamin E (or any other               multi-vitamin)  Gingkoba                                                                Zactirin     This list does not include every medication that contains aspirin or ibuprofen.  Before  taking any medication prior to or after surgery, please read the label carefully for the active ingredients aspirin, salicylates, and/or ibuprofen.  If the medication contains these ingredients do not use.  Please inform your physician of all medications you are taking, including non-prescription medications.       PRE-OPERATIVE NPO BOWEL PREPARATION     After 12:00 pm (noon) on the day before surgery, please drink only CLEAR LIQUIDS (NO dairy products) such as broth, plain jello, cranberry juice, apple juice, tea or water.     As soon as you wake up on the [place], please have a bowel movement if possible before you leave for the hospital.      Take your regular medications on the [place] (except Aspirin, Coumadin or Plavix).  If you regularly take any medication in the morning, especially insulin or other oral medication for diabetes you must discuss this with your doctor.  Please make sure that your doctor approves all the medications that you are taking.     DO NOT eat or drink anything after midnight on the night before your surgery.     REMEMBER: You cannot take aspirin or other similar medications (Motrin, Ibuprofen, Naprosen) or blood thinners (Coumadin, Plavix) for 1 week prior to this procedure.        If you have any questions about this Bowel Preparation, please call the [facility] Oncology Nurse Case Manager at # [phone]        PRE-OPERATIVE INSTRUCTIONS      If there is any change in your surgery time, you will be contacted by the operating room scheduling team after 5:00 p.m. the day before your surgery   1. For  7 days prior to surgery, please do not take any medications that contain aspirin, ibuprofen, or any non-steroidal anti-inflammatory medications.  If you must take pain medication, please take Tylenol or an acetaminophen substitute.  2. If you take prescription medications, check with the Anesthesiologist or the nurse in the Evaluation Center about whether you should take  your medications on the day of surgery, especially blood pressure and heart medications.    3. If you smoke, do not smoke for at least 24 hours prior to surgery.  Please be aware that [facility] is a non-smoking facility.  4. Wear comfortable, loose clothing to the hospital.  5. Leave all valuables at home.  This includes jewelry, credit cards, money (except for copayment for discharge medications).  6. ALL jewelry must be removed, including rings, earrings, necklaces, navel rings, etc.  7. If you wear contact lenses or glasses, bring a case for them.  Contact lenses must be removed prior to surgery.  8. Bring your insurance carrier cards and picture I.D. with you.  If your insurance plan is accepted at our [facility] and you would like to have your discharge prescriptions filled there, you will need to bring money for your co-payment.  9. Please arrange for transportation home after your surgery and hospital stay. A taxi or shuttle bus is not acceptable! You will need to be accompanied home by a responsible adult.  You may want to have a pillow and/or blanket in the car for the ride home.  10. If you will be staying in the hospital, bring a robe, slippers and any items for grooming that you may need during your stay.   11. Please call your doctor if you have any questions regarding your surgery or if you develop any signs or symptoms of illness (fever, runny nose, cough, sore throat).     NOTE:   Your surgery may have to be cancelled if you:    1) You do not follow the fasting/bowel prep guidelines    2) You arrive late for check in at the hospital  3) You have not arranged for a responsible adult to accompany you home       Pedro Shannon  [address] [place], [place] [address]    Pedro Shannon,          This is a letter to confirm your appointments for surgery.  If you have any questions regarding these appointments please feel free to contact me at # [phone].           Confirmation on your appointments for surgery:    Hospital Pre-Evaluation Appointment:   Friday- Jun 29, 2016 at 2:30 pm         Check in at the Information Desk, located on the first floor of [facility]    Address of [facility]:   [address]           [place], [place]    Surgery Check-In:      Wednesday- Jul 18, 2016 at 11:00 am    Check in at the Information Desk, located on the first floor of [facility]    Address of [facility]:    [place], [place] [address]    MD Appointment:       Thursday- August 30, 2016  at 10:45 am       Please check into the [facility]:     Address of [facility]:  [address], 1st floor [place], [place] [address]    If you have any questions regarding the surgery, please call Dr. Unknown Foley nurse case manager via phone # [phone]      Thank you again for allowing [facility] to participate in your care.    Sincerely,    Hart Carwin to Dr. Nani Skillern

## 2016-06-29 ENCOUNTER — Ambulatory Visit: Payer: Commercial Managed Care - HMO | Attending: General Surgery | Admitting: General Surgery

## 2016-06-29 ENCOUNTER — Encounter (HOSPITAL_BASED_OUTPATIENT_CLINIC_OR_DEPARTMENT_OTHER): Payer: Self-pay | Admitting: General Surgery

## 2016-06-29 DIAGNOSIS — Z01818 Encounter for other preprocedural examination: Principal | ICD-10-CM

## 2016-06-29 NOTE — Progress Notes (Signed)
Please come by [place] any day after you return from vacation for a Urine culture ordered by Dr Nani Skillern please. Thankyou,

## 2016-06-29 NOTE — Anesthesia Preprocedure Evaluation (Addendum)
ANESTHESIA PRE-OPERATIVE EVALUATION    Patient Information    Name: Pedro Shannon    MRN: [id number]    DOB: [birth year]    Age: 66 year old    Sex: male  Procedure(s):  CYSTOSCOPY with REZUM      Pre-op Vitals:   There were no vitals taken for this visit.        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 no       Pre Anesthesia Testing (PCC/CPC) notes/comments:    [facility] Test & records reviewed by [facility] provider    Phone anes eval: Pre op instructions reviewed and emailed  :  :                       Last  OSA (STOP BANG) Score:  No Data Recorded    Last OSA  (STOP) Score for   Has a physician diagnosed you with sleep apnea?: No  Do you use a CPAP at home?: No  Do you snore loudly (loud enough to be heard through a closed door)?: 0  Do you often feel tired, fatigued or sleepy during the day?: 0  Has anyone observed that you stop breathing while you are sleeping?: 0  Have you ever been treated for high blood pressure?: 0  OSA total score (A score of 2 or more is high risk. Offer patient sleep study.): 0      Has a physician diagnosed you with sleep apnea?: No  Do you use a CPAP at home?: No  OSA total score (A score of 2 or more is high risk. Offer patient sleep  study.): 0    Past Medical History:   Diagnosis Date    Prostate cancer (CMS-HCC)      Past Surgical History:   Procedure Laterality Date    NASAL SEPTOPLASTY      R inguinal hernia       Social History   Substance Use Topics    Smoking status: Never Smoker    Smokeless tobacco: Never Used    Alcohol use No       Current Outpatient Prescriptions   Medication Sig Dispense Refill    dutasteride (AVODART) 0.5 MG capsule Take 1 capsule (0.5 mg) by mouth daily. 90 capsule 11    tamsulosin (FLOMAX) 0.4 MG capsule Take 2 capsules (0.8 mg) by mouth daily.  180 capsule 3     No current facility-administered medications for this visit.      Allergies   Allergen Reactions    Dutasteride Hives    Rogaine [Minoxidil] Hives       Labs and Other Data  Lab Results   Component Value Date    CREAT 0.89 07/26/2015     No results found for: AST, ALT, GGT, LDH, ALK, TP, ALB, TBILI, DBILI  No results found for: WBC, RBC, HGB, HCT, MCV, MCHC, RDW, PLT, MPV, MPVH, SEG, LYMPHS, MONOS, EOS, BASOS  No results found for: INR, PTT  No results found for: ARTPH, ARTPO2, ARTPCO2    Anesthesia Plan:  Risks and Benefits of Anesthesia      I have prescribed the anesthetic plan:         Planned anesthesia method: General         ASA 2 (Mild systemic disease)       Planned monitoring method: Routine monitoring

## 2016-06-29 NOTE — Patient Instructions (Signed)
PREOPERATIVE SURGICAL INFORMATION    Your surgery is currently scheduled at [facility]/facility/department on 07/18/16  With a planned report time of 1030am      ?  [facility], [address], [place], [place] [address]   The main entrance to [facility] [place] centre is through the current main entrance at [place]     Please check in at Main Admissions on the 1st floor         QUESTIONS  If you have any questions between now and the day of your surgery, please do not hesitate to call:     [facility] Preoperative [place]: [place] ARRIVAL TIME:  On the day of your Surgery/Procedure, please arrive at the time provided by the surgery/preop team. If you have any questions regarding your arrival time, please call:     Preoperative Surgical Admissions at [facility]: [place] 7 DAYS BEFORE SURGERY/PROCEDURE:     PLEASE HOLD ASPIRIN AND ALL NSAIDS (non-steroidal anti-inflammatory drugs) SUCH AS advil, aleve, motrin, ibuprofen, relafen, lodine, feldene, Diclofenac, voltaren, indomethacin, naproxen, celebrex, Mobic.        Please hold vitamins, supplements, herbs & fish oil.     REGULAR PRESCRIPTION MEDICATIONS:     Regular prescription medications should be taken the day of surgery with sips of water      AFTER YOUR VISIT WITH [place], IF YOU START TAKING A NEW MEDICATION BEFORE SURGERY, PLEASE CALL us TO MAKE SURE IT IS SAFE TO TAKE & WON'T EFFECT YOUR SURGERY.       ?  EATING/DRINKING     PLEASE DO NOT EAT OR DRINK ANYTHING AFTER MIDNIGHT THE NIGHT BEFORE SURGERY.   ?  Preparing for your Surgery:     Please wear clean loose-fitting clothes and leave valuables at home        Bring a picture ID and your insurance card, and be prepared to pay your deductible or co-insurance by cash, check, or credit card when you arrive.        If you  are going home after your surgery, please make sure to arrange for an adult to drive you home. If you do not do so, your surgery may be cancelled.      On The Day of Your Surgery:      Check in at the location mentioned above        You will meet your anesthesia and surgery teams before surgery.        Once surgery is over, you will wake up in the recovery room where you will be able to see your friends/family.        Once your time in the recovery room is complete, you will either go home or be admitted as planned.        If you go home, someone will need to stay with you for the first 24 hours after surgery.       Additional information about what to expect before & after surgery is available online at:  [email or web address]    Or by searching You-tube for [place] before surgery and [place] after surgery    You medical records are available to you at [email or web address]  Select create account.

## 2016-07-12 ENCOUNTER — Other Ambulatory Visit: Payer: Commercial Managed Care - HMO | Attending: Urology

## 2016-07-12 ENCOUNTER — Telehealth (HOSPITAL_BASED_OUTPATIENT_CLINIC_OR_DEPARTMENT_OTHER): Payer: Self-pay

## 2016-07-12 DIAGNOSIS — R399 Unspecified symptoms and signs involving the genitourinary system: Principal | ICD-10-CM | POA: Insufficient documentation

## 2016-07-12 NOTE — Telephone Encounter (Signed)
Talked to the patient about him going  to the lab for urine culture that was ordered by his surgeon.  He said that he will go today or tomorrow.Elmyra Ricks, RN.

## 2016-07-14 LAB — URINE CULTURE: Urine Culture Result: NO GROWTH

## 2016-07-16 ENCOUNTER — Telehealth (HOSPITAL_BASED_OUTPATIENT_CLINIC_OR_DEPARTMENT_OTHER): Payer: Self-pay | Admitting: Urology

## 2016-07-16 NOTE — Telephone Encounter (Signed)
Spoke with pt today, pt with questions regarding cysto with rezum.  Pt attempting to confirm check in/appt time.  Per epic appt at 9:40.  Pt states paperwork he received states check in at 65.  Attempted to call Janett Billow AA to confirm.  Routing to have her call pt to confirm details

## 2016-07-16 NOTE — Interdisciplinary (Signed)
Labs viewed.

## 2016-07-17 NOTE — Telephone Encounter (Signed)
I have called pt on his preferred number listed. There was no answer, i left pt a message informing him his check in time for surgery is at 7:35am for surgery to begin at 9:35am.

## 2016-07-17 NOTE — Telephone Encounter (Signed)
Pt called and confirmed arrival time.

## 2016-07-18 ENCOUNTER — Ambulatory Visit
Admission: RE | Admit: 2016-07-18 | Discharge: 2016-07-18 | Disposition: A | Discharge status: Home / Self Care | Payer: Commercial Managed Care - HMO | Attending: Urology | Admitting: Urology

## 2016-07-18 ENCOUNTER — Encounter (HOSPITAL_COMMUNITY): Admission: RE | Disposition: A | Discharge status: Home Health | Payer: Self-pay | Attending: Urology

## 2016-07-18 ENCOUNTER — Ambulatory Visit (HOSPITAL_COMMUNITY): Payer: Commercial Managed Care - HMO | Admitting: General Surgery

## 2016-07-18 ENCOUNTER — Other Ambulatory Visit: Payer: Self-pay | Admitting: Urology

## 2016-07-18 ENCOUNTER — Ambulatory Visit (HOSPITAL_BASED_OUTPATIENT_CLINIC_OR_DEPARTMENT_OTHER): Payer: Commercial Managed Care - HMO | Admitting: Certified Registered"

## 2016-07-18 DIAGNOSIS — Z8546 Personal history of malignant neoplasm of prostate: Secondary | ICD-10-CM | POA: Insufficient documentation

## 2016-07-18 DIAGNOSIS — R32 Unspecified urinary incontinence: Secondary | ICD-10-CM | POA: Insufficient documentation

## 2016-07-18 DIAGNOSIS — N401 Enlarged prostate with lower urinary tract symptoms: Principal | ICD-10-CM | POA: Insufficient documentation

## 2016-07-18 DIAGNOSIS — Z888 Allergy status to other drugs, medicaments and biological substances status: Secondary | ICD-10-CM | POA: Insufficient documentation

## 2016-07-18 DIAGNOSIS — N3289 Other specified disorders of bladder: Secondary | ICD-10-CM | POA: Insufficient documentation

## 2016-07-18 DIAGNOSIS — N400 Enlarged prostate without lower urinary tract symptoms: Secondary | ICD-10-CM

## 2016-07-18 DIAGNOSIS — R351 Nocturia: Secondary | ICD-10-CM | POA: Insufficient documentation

## 2016-07-18 DIAGNOSIS — N323 Diverticulum of bladder: Secondary | ICD-10-CM | POA: Insufficient documentation

## 2016-07-18 SURGERY — CYSTOSCOPY
Anesthesia: General | Site: Penis | Wound class: Class II (Clean Contaminated)

## 2016-07-18 MED ORDER — LACTATED RINGERS IV SOLN
INTRAVENOUS | Status: DC
Start: 2016-07-18 — End: 2016-07-18

## 2016-07-18 MED ORDER — CIPROFLOXACIN IN D5W 400 MG/200ML IV SOLN
INTRAVENOUS | Status: DC | PRN
Start: 2016-07-18 — End: 2016-07-18
  Administered 2016-07-18: 400 mg via INTRAVENOUS

## 2016-07-18 MED ORDER — PROPOFOL 200 MG/20ML IV EMUL
INTRAVENOUS | Status: DC | PRN
Start: 2016-07-18 — End: 2016-07-18
  Administered 2016-07-18: 150 mg via INTRAVENOUS
  Administered 2016-07-18: 50 mg via INTRAVENOUS

## 2016-07-18 MED ORDER — LACTATED RINGERS IV SOLN
INTRAVENOUS | Status: DC | PRN
Start: 2016-07-18 — End: 2016-07-18
  Administered 2016-07-18: 10:00:00 via INTRAVENOUS

## 2016-07-18 MED ORDER — MEPERIDINE HCL 25 MG/ML IJ SOLN
12.5000 mg | INTRAMUSCULAR | Status: DC | PRN
Start: 2016-07-18 — End: 2016-07-18

## 2016-07-18 MED ORDER — MIDAZOLAM HCL 2 MG/2ML IJ SOLN
INTRAMUSCULAR | Status: DC | PRN
Start: 2016-07-18 — End: 2016-07-18
  Administered 2016-07-18: 2 mg via INTRAVENOUS

## 2016-07-18 MED ORDER — FENTANYL CITRATE (PF) 250 MCG/5ML IJ SOLN
INTRAMUSCULAR | Status: DC | PRN
Start: 2016-07-18 — End: 2016-07-18
  Administered 2016-07-18 (×2): 50 ug via INTRAVENOUS

## 2016-07-18 MED ORDER — PHENAZOPYRIDINE HCL 200 MG OR TABS
200.00 mg | ORAL_TABLET | ORAL | 0 refills | Status: AC
Start: 2016-07-18 — End: 2017-07-18

## 2016-07-18 MED ORDER — NALOXONE HCL 0.4 MG/ML IJ SOLN
0.1000 mg | INTRAMUSCULAR | Status: DC | PRN
Start: 2016-07-18 — End: 2016-07-18

## 2016-07-18 MED ORDER — PHENYLEPHRINE DILUTION 100 MCG/ML IJ SOLN
INTRAVENOUS | Status: DC | PRN
Start: 2016-07-18 — End: 2016-07-18
  Administered 2016-07-18: 200 ug via INTRAVENOUS

## 2016-07-18 MED ORDER — FENTANYL CITRATE (PF) 100 MCG/2ML IJ SOLN
25.0000 ug | INTRAMUSCULAR | Status: DC | PRN
Start: 2016-07-18 — End: 2016-07-18

## 2016-07-18 MED ORDER — ONDANSETRON HCL 4 MG/2ML IV SOLN
INTRAMUSCULAR | Status: DC | PRN
Start: 2016-07-18 — End: 2016-07-18
  Administered 2016-07-18: 4 mg via INTRAVENOUS

## 2016-07-18 MED ORDER — PHENAZOPYRIDINE HCL 200 MG OR TABS
200.0000 mg | ORAL_TABLET | Freq: Three times a day (TID) | ORAL | 0 refills | Status: DC | PRN
Start: 2016-07-18 — End: 2017-04-03

## 2016-07-18 MED ORDER — LIDOCAINE HCL (PF) 1 % IJ SOLN
0.1000 mL | INTRAMUSCULAR | Status: DC | PRN
Start: 2016-07-18 — End: 2016-07-18

## 2016-07-18 MED ORDER — ONDANSETRON HCL 4 MG/2ML IV SOLN
4.0000 mg | Freq: Once | INTRAMUSCULAR | Status: DC | PRN
Start: 2016-07-18 — End: 2016-07-18

## 2016-07-18 MED ORDER — LIDOCAINE HCL (CARDIAC) 20 MG/ML IV SOLN
INTRAVENOUS | Status: DC | PRN
Start: 2016-07-18 — End: 2016-07-18
  Administered 2016-07-18: 100 mg via INTRAVENOUS

## 2016-07-18 SURGICAL SUPPLY — 19 items
BAG CYSTO DRAIN SKYTRON TABLE (Misc Surgical Supply) ×2 IMPLANT
BAG URINE DRAIN 200Ml (Drains/Catheter/Tubes/Reservoir) ×1
CATHETER FOLEY BARDEX I.C. 16FR 5CC, 2-WAY (Lines/Drains) ×2 IMPLANT
CONTAINER PRECISION SPECIMEN, 4OZ- STERILE (Misc Medical Supply)
DRAIN BAG URINE 2000 ML (Drains/Catheter/Tubes/Reservoir) ×1 IMPLANT
GLOVE BIOGEL PI ULTRATOUCH SIZE 6.5 (Gloves/gowns) ×2
GLOVE SURGEON BIOGEL SIZE 7 (Gloves/gowns) ×4
GLOVE SURGEON BIOGEL SIZE 7.5 (Gloves/gowns) IMPLANT
GOWN SIRUS XLG BLUE, AAMI LVL 4 (Gloves/gowns) ×2
GOWN SURGICAL ULTRA LG BLUE, AAMI LVL 3 (Gloves/gowns) ×2 IMPLANT
KIT REZUM DELIVERY DEVICE (Kits/Sets/Trays) ×2 IMPLANT
PAD GROUND VALLEYLAB REM ADULT E7507 (Misc Surgical Supply)
SLEEVE SCD KNEE MEDIUM (Misc Medical Supply) ×2 IMPLANT
SOLUTION IRR BAG H20 3000ML (Non-Pharmacy Meds/Solutions) IMPLANT
SOLUTION IRR POUR BTL 0.9% NS 1000ML (Non-Pharmacy Meds/Solutions) ×2
SOLUTION IRR POUR BTL H20 1000ML (Non-Pharmacy Meds/Solutions) IMPLANT
SOLUTION IV 0.9% NS 1000ML (Non-Pharmacy Meds/Solutions) ×2
SURGICAL PACK CYSTO - SAME DAY (Procedure Packs/kits) ×2 IMPLANT
TUBING SUCTION MEDI-VAC 9/32" X 20' (Tubing/suction) ×2

## 2016-07-18 NOTE — Op Note (Signed)
UROLOGY OPERATIVE NOTE    CASE ID: [id number]    DATE: 07/18/2016  TIME: 10:31 AM    PREOPERATIVE DIAGNOSIS: BPH/LUTS    POSTOPERATIVE DIAGNOSIS: Same    PROCEDURE:  Procedure(s):  CYSTOSCOPY with REZUM    Primary: Pedro Shannon  Resident - Assisting: Janace Hoard, MD    ANESTHESIA: GEN-LMA    Anesthesiologist: Maureen Chatters, MD  CRNA: Audry Pili., CRNA     OR Staff:  Circulator: Gaetano Hawthorne, RN; Blanchie Serve., RN  Scrub: Woodward Ku    FINDINGS: Normal urethra. Trabeculated bladder. Two posterior diverticuli. Bilobar hyperplasia, approximately 4 cm prostatic urethral length. Four Rezum treatments delivered.    INDICATIONS FOR PROCEDURE:  [name] is a 66 year old male with a history of long-standing enlarged prostate and lower urinary tract symptoms refractory to alpha blocker and intolerant of 5-alpha reductase inhibitor. He also has a history of low risk prostate cancer with stable low PSA on active surveillance. He presents today for Rezum transurethral needle ablation to improve his urinary symptoms.    PROCEDURE IN DETAIL:  The patient was met and examined in the preoperative area. The indications, risks, benefits, and alternatives of the procedure were explained to the patient.  The discussion was conducted in terms that the patient could understand. The patient expressed understanding, all questions were answered, and informed consent was obtained. The patient was then brought to the operating room and positioned supine on the surgical bed. Sequential compression devices were applied to bilateral lower extremities. Prior to induction of anesthesia, a briefing was performed confirming the correct patient, patient information, procedure and equipment availability. Once anesthesia was induced, the patient was placed in a dorsal lithotomy position, prepped and draped in the usual sterile fashion, and antibiotics consisting of ciprofloxacin 400 mg IV were  administered.    After a final timeout was performed, we began by performing a thorough cystoscopy with the rigid cystoscopy and 17.5 Fr sheath. The urethra was normal. Prostatic segment approximately 4 cm with predominantly bilobar hyperplasia and a slight high-riding bladder neck. The bladder was trabeculated with two large posterior diverticuli. There were no urothelial lesions. The ureteral orifices were orthotopic and normal in appearance.    We exchanged the cystoscope for the Rezum device which was used to deliver two water vapor treatments to each lateral lobe, taking care to remain 1 cm distal to the bladder neck and proximal to the external sphincter. There was minimal bleeding.    A 16 Fr catheter was placed draining clear urine at the conclusion of the procedure, which the patient tolerated well.    The attending surgeon, Dr. Nani Skillern, was present and actively involved for the entirety of the case.    WOUND CLASSIFICATION:  Procedure(s):  CYSTOSCOPY with REZUM    WOUND CLOSURE STATUS:  Procedure(s):  CYSTOSCOPY with REZUM    SPECIMENS:  * No specimens in log *    Fluids/Blood Products:      IV Fluids: 750 ml crystalloid    Blood Products: none    EBL: 0 ml    Urine Output: unknown    COMPLICATIONS: None    DISPOSITION:  Extubated in the OR, brought to PACU in stable condition  Discharge to home with catheter in place  Void trial at home in two days  Return to clinic in 4-6 weeks for symptom review.

## 2016-07-18 NOTE — H&P (Signed)
UROLOGY HISTORY AND PHYSICAL    CC:  LUTS    History of Present Illness:     Pedro Shannon is a 66 year old male with no family hx of prostate cancer presenting with elevated PSA, prostate nodule in the setting of a suspicious MRI, now SP MRI biopsy 10/28/15 for a small focus of Gleason 6 disease.    His current LUTS include frequency every 1 hours during day, nocturia q 2h times, occasional urgency, occasional incontinence/dribbling before and after void, no dysuria, no hematuria, a weak to adequate urinary stream with occasional sensation of post void residual. On tamsulosin at 0.4 mg daily.    He has stopped Dutasteride as it caused hives.    SS 19/4  SHIM 12    He is sexually active on bi-mix with good results. Having some am erections.    No new onset bone pain, night sweats, fevers, chills or unintended weight loss.          Lab Results   Component Value Date/Time    PSA 2.12 05/08/2016 01:15 PM    PSA 2.92 07/26/2015 03:04 PM        PSA 3.2 01/09/16.    FINAL PATHOLOGIC DIAGNOSIS 10/28/15 reviewed personally:  A: Prostate, right lateral apex, biopsy  -Benign prostatic tissue.  -See comment.  B: Prostate, right medial apex, biopsy  -Benign prostatic tissue.  C: Prostate, right lateral mid, biopsy  -Benign fibromuscular tissue.  D: Prostate, right medial mid, biopsy  -Benign prostatic tissue.  -See comment.  E: Prostate, right lateral base, biopsy  -Benign prostatic tissue.  F: Prostate, right medial base, biopsy  -Benign prostatic tissue.  G: Prostate, left lateral apex, biopsy  -Benign prostatic tissue.  H: Prostate, left medial apex, biopsy  -Benign prostatic tissue with mild acute inflammation.  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  -See comment.  J: Prostate, left medial mid, biopsy  -Benign prostatic tissue.  K:  Prostate, left lateral base, biopsy  -Benign prostatic tissue.  L: Prostate, left medial base, biopsy  -Benign prostatic tissue.  -Seminal vesicle/ejaculatory duct tissue identified.  M: Prostate, target #1, biopsy  -Benign prostatic tissue.  N: Prostate, target #2, biopsy  -Prostatic tissue with a small focus of atypical glands, cannot exclude  prostatic adenocarcinoma.  -See comment.    MRI PELVIS WO/W CONTRAST 08/25/15 reviewed personally:  1. Two PI-RADS 3 lesions, in the left prostatic transition zone mid-gland to   base, and in the right prostatic mid-gland, both possibly also involving the   peripheral zone.    2. No definite extra-prostatic extension.    3. Multiple incidental findings including bilateral renal hernias, bladder   diverticula, bladder wall thickening, and colonic diverticuli.        ROS:  Per HPI    Past Medical and Surgical History:  Past Medical History:   Diagnosis Date    Prostate cancer (CMS-HCC)      Past Surgical History:   Procedure Laterality Date    NASAL SEPTOPLASTY      R inguinal hernia         Allergies:  Allergies   Allergen Reactions    Dutasteride Hives    Rogaine [Minoxidil] Hives       Medications:  No current facility-administered medications on file prior to encounter.      Current Outpatient Prescriptions  on File Prior to Encounter   Medication Sig Dispense Refill    tamsulosin (FLOMAX) 0.4 MG capsule Take 2 capsules (0.8 mg) by mouth daily. 180 capsule 3       Social History:  Social History     Social History    Marital status: Married     Spouse name: N/A    Number of children: N/A    Years of education: N/A     Social History Main Topics    Smoking status: Never Smoker    Smokeless tobacco: Never Used    Alcohol use No    Drug use: No    Sexual activity: Not on file     Social Activities of Daily Living Present    Not on file     Social History Narrative       Family History:  Family History   Problem Relation Age of Onset     Lung Cancer Mother     Melanoma Cancer Mother     Other Father      esophageal cancer       ----------------------------------------------------------------------------------------------    Physical Exam:  BP  Min: 127/73  Max: 127/73  Temp  Min: 97.2 F (36.2 C)  Max: 97.2 F (36.2 C)  Pulse  Min: 68  Max: 68  Resp  Min: 18  Max: 18  SpO2  Min: 94 %  Max: 94 %  Height  Min: 6' (182.9 cm)  Max: 6' (182.9 cm)  Weight  Min: 88.8 kg (195 lb 11.2 oz)  Max: 88.8 kg (195 lb 11.2 oz)       GENERAL: Pleasant, cooperative and in no acute distress.   NEURO: Alert and Oriented x 3  HEENT: normalcephalic/atraumatic  NECK: midline trachea  CV: distal pulses intact  PULM: non-labored breathing  GI: soft, NT, ND  BACK: No CVAT  SKIN: no obvious rashes or lesions  EXT: warm and well perfused.   PSYCH: mood appropriate          Labs:   No results found for: WBC, RBC, HGB, HCT, MCV, MCHC, RDW, PLT, MPV    Lab Results   Component Value Date    CREAT 0.89 07/26/2015       No results found for: COLORUA, APPEARUA, GLUCOSEUA, BILIUA, KETONEUA, SGUA, BLOODUA, PHUA, PROTEINUA, UROBILUA, NITRITEUA, LEUKESTUA, WBCUA, RBCUA, EPITHCELLSUA, HYALINEUA, CRYSTALSUA, COMMENTSUA    No results found for: INR, PTT       Microbiology:  Negative pre op urine culture      ______________________________________________________________________  Assessment and Plan:  71 M history of low risk prostate cancer on active surveillance with LUTS refractory to medication who presents for Rezum procedure.    All questions answered. Proceed.    Discussed with Dr. Nani Skillern, attending of record.    ---  Abelina Bachelor. Jacqlyn Larsen, [facility], PGY2    Urology pagers  Please do not hesitate to contact the appropriate service with any questions or concerns.  Prudence Davidson ([facility]): [phone]  [facility]: [phone]

## 2016-07-18 NOTE — Discharge Instructions (Signed)
Urology Discharge Instructions    You may experience any of the following after an operation:   Soreness from urethra.   A sore throat if you were put to sleep and a breathing tube was placed in your throat.   Sleepiness from anesthesia or other drugs given to you prior to or during your surgery.   Fatigue just from having surgery.   Nausea occurs sometimes, but depends largely on your reaction to surgery and drugs.  These discomforts should improve rapidly and are usually gone the day following surgery.    You may speed your own recovery by the following precautions and self care:    1. Dressing and/or incision care:  n/a    2. Diet:   Progress to your normal diet as tolerated.     3. Activity:  Avoid driving or operating machinery while you require pain medication    4. Bathing:  You may take a shower today.    5. Medications:  Your physician will advise you on what medications to take for discomfort, and provide prescriptions as needed., Take only the medications your physician has prescribed. and Avoid aspirin for pain.    6. Return Appointment:  You will have a follow up appointment with Dr. Nani Skillern in 4 weeks. Please see the contact information below to confirm scheduling of this appointment.    7. Other Instructions:    You should remove your catheter on Friday morning as instructed by the nurses. Cut the balloon port with a pair of scissors and allow the fluid to drain out. While in the shower, gently remove the catheter. Call our clinic if you have not voided within 6 hrs of catheter removal.    Call your doctor or come to the Emergency Department at [facility] if you have:   severe chills or fever   excessive bleeding from the site of your surgery, or if your dressing becomes soaked with blood   excessive pain, swelling, or odor at the site of your surgery   fainting, trouble breathing, or persistent dizziness   unable to urinate      SPECIFIC INSTRUCTIONS RELATED TO YOUR PROCEDURE  If you  have a foley catheter, your foley catheter will stay in place until your follow-up appointment with Urology for removal.  You may resume your normal activity and your normal diet as tolerated.   Your nurse will teach you how to take care of the catheter before you go home.    OK to restart all home medications except aspirin/aleve/ibuprofen, plavix, coumadin, lovenox until notified by your physician at clinic visit or until your urine color remains clear for 2 days  You may be provided with pyridium for bladder discomfort or bladder spasms. Take this medication as needed. This medication turns your urine [place] which is expected.      You may see blood in your urine which is normal.This will clear up over time. Drinking lots of water helps clear up urine faster. Seek immediate attention if abdominal pain, passing large clots, urine turns ketch-up color or stops flowing and you cannot empty your bladder accompanied by abdominal pain, fevers >101.4, pain not controlled by pain medication and intractable nausea and vomiting, chest pain or shortness of breath, inability to urinate.    [facility] Contact Information:     If you have any questions about your hospital care, your medications, or if you have new or concerning symptoms soon after going home, and you need  to contact your hospital physician, your hospital physician can be contacted in the following manner:      Clinic appointments:  [facility] Scheduling: [phone]    Business Hours (Monday-Friday; 08:00AM - 4:30PM; Non-Urgent):  Urology Office phone at [phone] or [phone]    After-Hours, Weekends/Holidays, and Urgent:  FOR EMERGENCY ISSUES ONLY, call [phone] and ask them to page the on-call urologist. If it is not an emergency, then wait for business hours and call the number above    Contact information for specific clinics are listed below:     - MD [place] nurse is RN Bari Mantis.  RN [place]'s office # is [phone].    If your clinic appointment has not been scheduled before you leave the hospital and you do not receive any call within 2 business days, call the clinic scheduler for your urologist's clinic to schedule the appointment

## 2016-07-18 NOTE — Brief Op Note (Signed)
UROLOGY BRIEF OPERATIVE NOTE    CASE ID: [id number]    DATE: 07/18/2016  TIME: 10:31 AM    PREOPERATIVE DIAGNOSIS: BPH/LUTS    POSTOPERATIVE DIAGNOSIS: Same    PROCEDURE:  Procedure(s):  CYSTOSCOPY with REZUM    Primary: Pedro Shannon  Resident - Assisting: Janace Hoard, MD    ANESTHESIA: GEN-LMA    Anesthesiologist: Maureen Chatters, MD  CRNA: Audry Pili., CRNA     OR Staff:  Circulator: Gaetano Hawthorne, RN; Blanchie Serve., RN  Scrub: Woodward Ku    FINDINGS: Normal urethra. Trabeculated bladder. Two posterior diverticuli. Bilobar hyperplasia, approximately 4 cm prostatic urethral length. Four Rezum treatments delivered.    WOUND CLASSIFICATION:  Procedure(s):  CYSTOSCOPY with REZUM    WOUND CLOSURE STATUS:  Procedure(s):  CYSTOSCOPY with REZUM    SPECIMENS:  * No specimens in log *    Fluids/Blood Products:      IV Fluids: 750 ml crystalloid    Blood Products: none    EBL: 0 ml    Urine Output: unknown    COMPLICATIONS: None    DISPOSITION:  Extubated in the OR, brought to PACU in stable condition  Discharge to home with catheter in place  Void trial at home in two days  Return to clinic in 4-6 weeks for symptom review.    Event Time In   Pre Procedure Start [phone]   Pre Procedure Criteria Complete 0951   In Room 0958   Incision 1021   Close 1026   Out of Room    In PACU    PACU Criteria Complete    In Recovery    Recovery Criteria Complete    Anesthesia Start 0959   Anesthesia Ready 1015   Anesthesia Stop    Epidural to C-Section    Regional Anesthesia Start    Regional Anesthesia Stop    Regional Block Administered           ---  Abelina Bachelor. Jacqlyn Larsen, [facility]

## 2016-07-18 NOTE — Anesthesia Postprocedure Evaluation (Signed)
Anesthesia Transfer of Care Note    Patient: Pedro Shannon    Procedures performed: Procedure(s) with comments:  REZUM transurethral needle ablation of prostate - Procedure performed transurethrally on prostate, not o penis    Vital signs: stable           Anesthesia Post Note    Patient: Pedro Shannon    Procedure(s) Performed: Procedure(s) with comments:  REZUM transurethral needle ablation of prostate - Procedure performed transurethrally on prostate, not o penis      Final anesthesia type: General    Patient location: PACU    Post anesthesia pain: adequate analgesia    Mental status: awake, alert  and oriented    Airway Patent: Yes    Last Vitals:   Vitals:    07/18/16 1130   BP: 137/90   Pulse: 55   Resp: 13   Temp:    SpO2: 97%       Post vital signs: stable    Hydration: adequate    N/V:no    Anesthetic complications: no    Plan of care per primary team.

## 2016-07-18 NOTE — Addendum Note (Signed)
Addendum  created 07/18/16 1215 by Maureen Chatters, MD    Anesthesia Review and Sign - Ready for Procedure

## 2016-08-08 ENCOUNTER — Encounter (HOSPITAL_BASED_OUTPATIENT_CLINIC_OR_DEPARTMENT_OTHER): Payer: Self-pay | Admitting: Urology

## 2016-08-28 NOTE — Progress Notes (Signed)
Reason for visit:   No chief complaint on file.    Demographics:        Date: 03/02/16       Patient Name: Pedro Shannon       Medical Record #: [id number]       DOB: 13-Nov-1950       Age: 66 year old       Sex: male    [address], [name] [place], [place] [address]  [address]    HPI:    Pedro Shannon is a 66 year old male with no family hx of prostate cancer presenting with elevated PSA, prostate nodule in the setting of a suspicious MRI, now SP MRI biopsy 10/28/15 for a small focus of Gleason 6 disease.    His current LUTS include frequency every 1 hours during day, nocturia q 2h times, occasional urgency, occasional incontinence/dribbling before and after void, no dysuria, no hematuria, a weak to adequate urinary stream with occasional sensation of post void residual. On tamsulosin at 0.4 mg daily. SS:    [facility] IPSS REVIEW FS 08/30/2016 06/07/2016 11/10/2015   Incomplete Emptying 3 4 3    Frequency 2 4 3    Intermittencey 2 4 0   Urgency 3 4 2    Weak Stream 1 4 1    Straining 1 3 0   Nocturia 3 3 3    IPSS Total Score 15 26 12    Quality of Life 4 5 5          He has stopped Dutasteride as it caused hives.    SS 19/4  SHIM 12    He is sexually active on bi-mix with good results. Having some am erections.    No new onset bone pain, night sweats, fevers, chills or unintended weight loss.    Lab Results   Component Value Date/Time    PSA 2.12 05/08/2016 01:15 PM    PSA 2.92 07/26/2015 03:04 PM        PSA 3.2 01/09/16.    FINAL PATHOLOGIC DIAGNOSIS 10/28/15 reviewed personally:  A: Prostate, right lateral apex, biopsy  -Benign prostatic tissue.  -See comment.  B: Prostate, right medial apex, biopsy       -Benign prostatic tissue.  C: Prostate, right lateral mid, biopsy       -Benign fibromuscular tissue.  D: Prostate, right medial mid, biopsy       -Benign prostatic tissue.       -See comment.  E: Prostate, right lateral base, biopsy       -Benign prostatic tissue.  F: Prostate, right medial  base, biopsy       -Benign prostatic tissue.  G: Prostate, left lateral apex, biopsy       -Benign prostatic tissue.  H: Prostate, left medial apex, biopsy       -Benign prostatic tissue with mild acute inflammation.  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  -See comment.  J: Prostate, left medial mid, biopsy       -Benign prostatic tissue.  K: Prostate, left lateral base, biopsy       -Benign prostatic tissue.  L: Prostate, left medial base, biopsy       -Benign prostatic tissue.       -Seminal vesicle/ejaculatory duct tissue identified.  M: Prostate, target #1, biopsy       -  Benign prostatic tissue.  N: Prostate, target #2, biopsy  -Prostatic tissue with a small focus of atypical glands, cannot exclude  prostatic adenocarcinoma.       -See comment.    MRI PELVIS WO/W CONTRAST 08/25/15 reviewed personally:  1. Two PI-RADS 3 lesions, in the left prostatic transition zone mid-gland to   base, and in the right prostatic mid-gland, both possibly also involving the   peripheral zone.    2. No definite extra-prostatic extension.    3. Multiple incidental findings including bilateral renal hernias, bladder   diverticula, bladder wall thickening, and colonic diverticuli.    Outside Records:  PSA  07/15/15 - 2.4  01/06/15 - 4.3  07/01/14 - 3.0  11/19/13 - 3.7  (% free 9%)  12/19/11 - 3.4  08/03/11 - 3.3  12/18/10 - 3.1  06/17/10 - 3.0    4K Score 02/26/15:    24% - high risk    Pathology 06/16/15:  A.  L Base  Atypical small acinar proliferation  B.  L Lateral base  Benign prostatic tissue  C.  L Mid  Atypical small acinar proliferation  D.  L Lateral Mid  Benign prostatic tissue  E.  L Apex  Benign prostatic tissue  F.  L Lateral Apex  Benign prostatic tissue  G.  R Base  Benign prostatic tissue  H.  R Lateral base  Benign prostatic tissue  I.  R Mid  Atypical small acinar proliferation  J.  R Lateral  Mid  Benign prostatic tissue  K.  R Apex  Benign prostatic tissue  L.  R Lateral Apex  Benign prostatic tissue    MRI 05/30/15:  PIRADS 4 lesion involving left base with possible extracapsular extension     Review of Systems   Constitutional: Negative.    HENT: Negative.    Eyes: Negative.    Respiratory: Negative.    Cardiovascular: Negative.    Gastrointestinal: Negative.    Genitourinary: Positive for frequency and urgency.   Musculoskeletal: Negative.    Skin: Negative.    Allergic/Immunologic: Positive for environmental allergies.        Detergents   Neurological: Negative.    Hematological: Negative.    Psychiatric/Behavioral: Positive for sleep disturbance.     Wellbeing assessment:  Wellbeing screening reviewed.    Pt indicated no needs.    Past Medical History:   Diagnosis Date    Prostate cancer (CMS-HCC)        Past Surgical History:   Procedure Laterality Date    NASAL SEPTOPLASTY      R inguinal hernia         Allergies   Allergen Reactions    Dutasteride Hives    Rogaine [Minoxidil] Hives       Current Outpatient Prescriptions   Medication Sig Dispense Refill    phenazopyridine (PYRIDIUM) 200 MG tablet Take 1 tablet (200 mg) by mouth 3 times daily as needed (dysuria). 21 tablet 0    tamsulosin (FLOMAX) 0.4 MG capsule Take 2 capsules (0.8 mg) by mouth daily. 180 capsule 3     No current facility-administered medications for this visit.        Social History     Social History    Marital status: Married     Spouse name: N/A    Number of children: N/A    Years of education: N/A     Occupational History    Not on file.     Social History Main  Topics    Smoking status: Never Smoker    Smokeless tobacco: Never Used    Alcohol use No    Drug use: No    Sexual activity: Not on file     Other Topics Concern    Not on file     Social History Narrative       Physical Exam:   There were no vitals filed for this visit.    GENERAL: The patient is alert, cooperative and in no acute distress.   HEENT:  normalcephalic/atraumatic  NECK: midline trachea. No jugular venous distention  PULM: no evidence of labored breathing  GI: soft, NT, ND, no massess.  No hepatosplenomegaly  BACK: No CVAT  EXTREMITES: Joints are normal without redness or swelling.   SKIN: There is no edema or cyanosis.   NEURO: no motor or sensory deficits.  Alert and Oriented x 3    Labs/Diagnostic X-rays:  No results found for: WBC, RBC, HGB, HCT, MCV, MCHC, RDW, PLT, MPV, SEGS, LYMPHS, MONOS, EOS, BASOS    Lab Results   Component Value Date    CREAT 0.89 07/26/2015       Lab Results   Component Value Date    PSA 2.12 05/08/2016       No results found for: TESTOSTERONE    No results found for: COLORUA, APPEARUA, [place], BILIUA, KETONEUA, [place], [place], [place], [place], UROBILUA, NITRITEUA, LEUKESTUA, [place], RBCUA, EPITHCELLSUA, HYALINEUA, CRYSTALSUA, COMMENTSUA      Assessment/Plan:     Kayvan Hoefling is a 66 year old male with low risk prostate cancer, bladder diverticulum and 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    Prostate cancer - continue AS   - he has dropping PSAs    LUTS - worsening  - frustrated, would like to pursue rezum  - urine culture    Outlined options including observation/conservative measures, surgery (TURP/TUNA).    He has opted for TUNA.    We will get labs and plan for cystoscopy.    The risks and benefits of the procedure were outlined to the patient. These included but were not limited to:    Symptom control - he was informed about the potential for symptom recurrence    Bladder perforation - with the potential need for laparotomy    Bleeding - with the possible need for blood transfusion    Infection - pneumonia, urinary tract    Damage to adjacent structures - bowel, ureteral orifices (with possible need for a ureteral stent), urethral sphincter    Anesthetic complications - heart attack, stroke, paralysis and death;      Diverticulum - natural hx discussed   - cystoscopy to assess at the time of  rezum      Thank you for allowing me to participate in this patient's care.   Sincerely,   Patty Sermons Kader

## 2016-08-30 ENCOUNTER — Ambulatory Visit: Payer: Commercial Managed Care - HMO | Attending: Urology | Admitting: Urology

## 2016-08-30 VITALS — BP 124/85 | HR 66 | Temp 98.2°F | Resp 18 | Ht 74.0 in | Wt 196.8 lb

## 2016-08-30 DIAGNOSIS — C61 Malignant neoplasm of prostate: Principal | ICD-10-CM | POA: Insufficient documentation

## 2016-08-30 DIAGNOSIS — N323 Diverticulum of bladder: Secondary | ICD-10-CM | POA: Insufficient documentation

## 2016-08-30 DIAGNOSIS — R399 Unspecified symptoms and signs involving the genitourinary system: Secondary | ICD-10-CM | POA: Insufficient documentation

## 2016-08-30 NOTE — Progress Notes (Signed)
Reason for visit:   Chief Complaint   Patient presents with    Recheck     Demographics:        Date: 03/02/16       Patient Name: Pedro Shannon       Medical Record #: [id number]       DOB: 1950/06/21       Age: 66 year old       Sex: male    Dicks, [name], [place], [place] [address]  Steward Drone    HPI:    Pedro Shannon is a 66 year old male with a h/o BPH w/LUTS and no family hx of prostate cancer presenting for post-op s/p Rezum 07/18/16.    The reports his symptoms are improved after Rezum. He reports significantly improved frequency, reduced to 2-3x day, and reduced nocturia to 3x a night from 5x nightly pre-operatively. He does report persistent urgency with some occasional drips of incontinence associated with the urgency. However, he thinks his urgency has continued to improve post-operatively. He reports a good stream, and no straining or hesitancy. He denies hematuria or dysuria. He stopped his Flomax 0.4 mg 1 week ago.    He also has 2 posterior bladder diverticula visualized on cysto during Rezum that the pt is interested in treating eventually.    He also has a h/o elevated PSA, prostate nodule in the setting of a suspicious MRI, now SP MRI biopsy 10/28/15 for a small focus of Gleason 6 disease. Most recent PSA 05/08/16 was 2.12.      [place] URO IPSS REVIEW FS 08/30/2016 06/07/2016 11/10/2015   Incomplete Emptying 3 4 3    Frequency 2 4 3    Intermittencey 2 4 0   Urgency 3 4 2    Weak Stream 1 4 1    Straining 1 3 0   Nocturia 3 3 3    IPSS Total Score 15 26 12    Quality of Life 4 5 5        He is sexually active on bi-mix with good results. Having some am erections.    No new onset bone pain, night sweats, fevers, chills or unintended weight loss.    Lab Results   Component Value Date/Time    PSA 2.12 05/08/2016 01:15 PM    PSA 2.92 07/26/2015 03:04 PM        PSA 3.2 01/09/16.    FINAL PATHOLOGIC DIAGNOSIS 10/28/15 reviewed personally:  A: Prostate, right lateral apex,  biopsy  -Benign prostatic tissue.  -See comment.  B: Prostate, right medial apex, biopsy       -Benign prostatic tissue.  C: Prostate, right lateral mid, biopsy       -Benign fibromuscular tissue.  D: Prostate, right medial mid, biopsy       -Benign prostatic tissue.       -See comment.  E: Prostate, right lateral base, biopsy       -Benign prostatic tissue.  F: Prostate, right medial base, biopsy       -Benign prostatic tissue.  G: Prostate, left lateral apex, biopsy       -Benign prostatic tissue.  H: Prostate, left medial apex, biopsy       -Benign prostatic tissue with mild acute inflammation.  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  -See comment.  J: Prostate, left medial mid, biopsy       -Benign prostatic tissue.  K: Prostate, left lateral base, biopsy       -Benign prostatic tissue.  L: Prostate, left medial base, biopsy       -Benign prostatic tissue.       -Seminal vesicle/ejaculatory duct tissue identified.  M: Prostate, target #1, biopsy       -Benign prostatic tissue.  N: Prostate, target #2, biopsy  -Prostatic tissue with a small focus of atypical glands, cannot exclude  prostatic adenocarcinoma.       -See comment.    MRI PELVIS WO/W CONTRAST 08/25/15 reviewed personally:  1. Two PI-RADS 3 lesions, in the left prostatic transition zone mid-gland to   base, and in the right prostatic mid-gland, both possibly also involving the   peripheral zone.    2. No definite extra-prostatic extension.    3. Multiple incidental findings including bilateral renal hernias, bladder   diverticula, bladder wall thickening, and colonic diverticuli.    Outside Records:  PSA  07/15/15 - 2.4  01/06/15 - 4.3  07/01/14 - 3.0  11/19/13 - 3.7  (% free 9%)  12/19/11 - 3.4  08/03/11 - 3.3  12/18/10 - 3.1  06/17/10 - 3.0    4K Score 02/26/15:    24% - high risk    Pathology 06/16/15:  A.  L Base  Atypical small  acinar proliferation  B.  L Lateral base  Benign prostatic tissue  C.  L Mid  Atypical small acinar proliferation  D.  L Lateral Mid  Benign prostatic tissue  E.  L Apex  Benign prostatic tissue  F.  L Lateral Apex  Benign prostatic tissue  G.  R Base  Benign prostatic tissue  H.  R Lateral base  Benign prostatic tissue  I.  R Mid  Atypical small acinar proliferation  J.  R Lateral Mid  Benign prostatic tissue  K.  R Apex  Benign prostatic tissue  L.  R Lateral Apex  Benign prostatic tissue    MRI 05/30/15:  PIRADS 4 lesion involving left base with possible extracapsular extension     Review of Systems   Constitutional: Negative.    HENT: Negative.    Eyes: Negative.    Respiratory: Negative.    Cardiovascular: Negative.    Gastrointestinal: Negative.    Genitourinary: Positive for frequency and urgency.   Musculoskeletal: Negative.    Skin: Negative.    Allergic/Immunologic: Positive for environmental allergies.        Detergents   Neurological: Negative.    Hematological: Negative.    Psychiatric/Behavioral: Positive for sleep disturbance.     Wellbeing assessment:  Wellbeing screening reviewed.    Pt indicated no needs.    Past Medical History:   Diagnosis Date    Prostate cancer (CMS-HCC)        Past Surgical History:   Procedure Laterality Date    NASAL SEPTOPLASTY      R inguinal hernia         Allergies   Allergen Reactions    Dutasteride Hives    Rogaine [Minoxidil] Hives       Current Outpatient Prescriptions   Medication Sig Dispense Refill    phenazopyridine (PYRIDIUM) 200 MG tablet Take 1 tablet (200 mg) by mouth 3 times daily as needed (dysuria). 21 tablet 0    tamsulosin (FLOMAX) 0.4 MG capsule Take 2 capsules (0.8 mg) by mouth daily. 180 capsule 3  No current facility-administered medications for this visit.        Social History     Social History    Marital status: Married     Spouse name: N/A    Number of children: N/A    Years of education: N/A     Occupational History    Not on file.      Social History Main Topics    Smoking status: Never Smoker    Smokeless tobacco: Never Used    Alcohol use No    Drug use: No    Sexual activity: Not on file     Other Topics Concern    Not on file     Social History Narrative       Physical Exam:   Vitals:    08/30/16 1100   BP: 124/85   BP Location: Right arm   BP Patient Position: Sitting   BP cuff size: Regular   Pulse: 66   Resp: 18   Temp: 98.2 F (36.8 C)   TempSrc: Oral   SpO2: 97%   Weight: 89.3 kg (196 lb 12.8 oz)   Height: 6\' 2"  (1.88 m)       GENERAL: The patient is alert, cooperative and in no acute distress.   HEENT: normalcephalic/atraumatic  NECK: midline trachea. No jugular venous distention  PULM: no evidence of labored breathing  GI: soft, NT, ND, no massess.  No hepatosplenomegaly  BACK: No CVAT  EXTREMITES: Joints are normal without redness or swelling.   SKIN: There is no edema or cyanosis.   NEURO: no motor or sensory deficits.  Alert and Oriented x 3    Labs/Diagnostic X-rays:  No results found for: WBC, RBC, HGB, HCT, MCV, MCHC, RDW, PLT, MPV, SEGS, LYMPHS, MONOS, EOS, BASOS    Lab Results   Component Value Date    CREAT 0.89 07/26/2015       Lab Results   Component Value Date    PSA 2.12 05/08/2016       No results found for: TESTOSTERONE    No results found for: COLORUA, APPEARUA, [place], [place], [place], [place], [place], [place], [place], UROBILUA, [place], [place], [place], [place], EPITHCELLSUA, HYALINEUA, CRYSTALSUA, COMMENTSUA      Assessment/Plan:     Pedro Shannon is a 66 year old male with low risk prostate cancer, bladder diverticulum and irritative LUTS refractory to Tamsulosin, no s/p Rezum with improving LUTS.    Prostate cancer - continue AS   - he has dropping PSAs    LUTS - improving s/p Rezum.    Bladder diverticulum-plan for f/u cystoscopy     He is to follow up in 3 months.    The student's history was reviewed, patient interviewed and examined. I agree with the assessment and plan documented by the  student.    Lucretia Roers MD, PhD, [facility]          Thank you for allowing me to participate in this patient's care.   Sincerely,   Patty Sermons Prerana Strayer

## 2016-08-30 NOTE — Patient Instructions (Signed)
A Karim Kader, MD, phD, FRCSC  Professor of Surgery  Department of Urology    To schedule a future appointment call [phone]  For Prescription refills please call [phone].            [facility] - Urology  Monday-Friday 8:00- 5:00p.m. (Closed Holidays and Weekends)    AFTER HOURS EMERGENCY NUMBER                  [phone]  Ask for the Urologist/Oncologist Physician On-Call.      Kayleeann Huxford, RN  Phone:  [phone]  Please note my phone number is for non-urgent   clinical questions that can be answered in the next 24 hours.        Administrative Assistant  Jessica [phone]  Fax [phone]    Infusion Center [phone]    Call to schedule, cancel, or reschedule chemotherapy appointments.  Infusion Center hours are Monday - Friday 07:30 - 9:30 and   Holidays and Weekends are 8am-6pm    Radiation Oncology [phone]    Call to schedule, cancel, or reschedule radiation appointments    Interventional Radiology Scheduling  [phone]    To Schedule MRI  [phone] or [phone]     Please contact the [place] [facility] Scheduling line at   [phone] to schedule your US, CT Scans or studies.      Social Worker: Carolina Herrera, LCSW  [phone]    Financial Counselor: Marisela Villanueva  [phone]    [facility]  [phone]

## 2016-10-10 NOTE — Progress Notes (Signed)
Reason for visit:   Chief Complaint   Patient presents with    Bladder Cancer    BPH     Demographics:        Date: 03/02/16       Patient Name: Pedro Shannon       Medical Record #: [id number]       DOB: Jul 08, 1950       Age: 66 year old       Sex: male    Dicks, [name], [place] [address]  Steward Drone    HPI:    Pedro Shannon is a 66 year old male with a h/o BPH w/LUTS and no family hx of prostate cancer presenting for post-op s/p Rezum 07/18/16.    The reports his symptoms are improved after Rezum. He reports significantly improved frequency, reduced to 2-3x day, and reduced nocturia to 3x a night from 5x nightly pre-operatively. He does report persistent urgency with some occasional drips of incontinence associated with the urgency. However, he thinks his urgency has continued to improve post-operatively. He reports a good stream, and no straining or hesitancy. He denies hematuria or dysuria. He stopped his Flomax 0.4 mg 1 week ago.    He also has 2 posterior bladder diverticula visualized on cysto during Rezum that the pt is interested in treating eventually.    He also has a h/o elevated PSA, prostate nodule in the setting of a suspicious MRI, now SP MRI biopsy 10/28/15 for a small focus of Gleason 6 disease. Most recent PSA 05/08/16 was 2.12.      [facility] URO IPSS REVIEW FS 10/12/2016 08/30/2016 06/07/2016 11/10/2015   Incomplete Emptying 4 3 4 3    Frequency 5 2 4 3    Intermittencey 2 2 4  0   Urgency 4 3 4 2    Weak Stream 4 1 4 1    Straining 2 1 3  0   Nocturia 4 3 3 3    IPSS Total Score 25 15 26 12    Quality of Life 6 4 5 5        He is sexually active on bi-mix with good results. Having some am erections.    No new onset bone pain, night sweats, fevers, chills or unintended weight loss.    Lab Results   Component Value Date/Time    PSA 2.12 05/08/2016 01:15 PM    PSA 2.92 07/26/2015 03:04 PM        PSA 3.2 01/09/16.    FINAL PATHOLOGIC DIAGNOSIS 10/28/15 reviewed  personally:  A: Prostate, right lateral apex, biopsy  -Benign prostatic tissue.  -See comment.  B: Prostate, right medial apex, biopsy       -Benign prostatic tissue.  C: Prostate, right lateral mid, biopsy       -Benign fibromuscular tissue.  D: Prostate, right medial mid, biopsy       -Benign prostatic tissue.       -See comment.  E: Prostate, right lateral base, biopsy       -Benign prostatic tissue.  F: Prostate, right medial base, biopsy       -Benign prostatic tissue.  G: Prostate, left lateral apex, biopsy       -Benign prostatic tissue.  H: Prostate, left medial apex, biopsy       -Benign prostatic tissue with mild acute inflammation.  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  -See comment.  J: Prostate, left medial mid, biopsy       -Benign prostatic tissue.  K: Prostate, left lateral base, biopsy       -Benign prostatic tissue.  L: Prostate, left medial base, biopsy       -Benign prostatic tissue.       -Seminal vesicle/ejaculatory duct tissue identified.  M: Prostate, target #1, biopsy       -Benign prostatic tissue.  N: Prostate, target #2, biopsy  -Prostatic tissue with a small focus of atypical glands, cannot exclude  prostatic adenocarcinoma.       -See comment.    MRI PELVIS WO/W CONTRAST 08/25/15 reviewed personally:  1. Two PI-RADS 3 lesions, in the left prostatic transition zone mid-gland to   base, and in the right prostatic mid-gland, both possibly also involving the   peripheral zone.    2. No definite extra-prostatic extension.    3. Multiple incidental findings including bilateral renal hernias, bladder   diverticula, bladder wall thickening, and colonic diverticuli.    Outside Records:  PSA  07/15/15 - 2.4  01/06/15 - 4.3  07/01/14 - 3.0  11/19/13 - 3.7  (% free 9%)  12/19/11 - 3.4  08/03/11 - 3.3  12/18/10 - 3.1  06/17/10 - 3.0    4K Score 02/26/15:    24% - high  risk    Pathology 06/16/15:  A.  L Base  Atypical small acinar proliferation  B.  L Lateral base  Benign prostatic tissue  C.  L Mid  Atypical small acinar proliferation  D.  L Lateral Mid  Benign prostatic tissue  E.  L Apex  Benign prostatic tissue  F.  L Lateral Apex  Benign prostatic tissue  G.  R Base  Benign prostatic tissue  H.  R Lateral base  Benign prostatic tissue  I.  R Mid  Atypical small acinar proliferation  J.  R Lateral Mid  Benign prostatic tissue  K.  R Apex  Benign prostatic tissue  L.  R Lateral Apex  Benign prostatic tissue    MRI 05/30/15:  PIRADS 4 lesion involving left base with possible extracapsular extension     Review of Systems   Constitutional: Negative.    HENT: Negative.    Eyes: Negative.    Respiratory: Negative.    Cardiovascular: Negative.    Gastrointestinal: Negative.    Genitourinary: Positive for frequency and urgency.   Musculoskeletal: Negative.    Skin: Negative.    Allergic/Immunologic: Positive for environmental allergies.        Detergents   Neurological: Negative.    Hematological: Negative.    Psychiatric/Behavioral: Positive for sleep disturbance.     Wellbeing assessment:  Wellbeing screening reviewed.    Pt indicated no needs.    Past Medical History:   Diagnosis Date    Prostate cancer (CMS-HCC)        Past Surgical History:   Procedure Laterality Date    NASAL SEPTOPLASTY      R inguinal hernia         Allergies   Allergen Reactions    Dutasteride Hives    Rogaine [Minoxidil] Hives       Current Outpatient Prescriptions   Medication Sig Dispense Refill    phenazopyridine (PYRIDIUM) 200 MG tablet Take 1 tablet (200 mg) by mouth 3 times daily as needed (dysuria). 21 tablet 0    tamsulosin (FLOMAX) 0.4  MG capsule Take 2 capsules (0.8 mg) by mouth daily. 180 capsule 3     No current facility-administered medications for this visit.        Social History     Social History    Marital status: Married     Spouse name: N/A    Number of children: N/A    Years of  education: N/A     Occupational History    Not on file.     Social History Main Topics    Smoking status: Never Smoker    Smokeless tobacco: Never Used    Alcohol use No    Drug use: No    Sexual activity: Not on file     Other Topics Concern    Not on file     Social History Narrative       Physical Exam:   Vitals:    10/12/16 1041   BP: 138/67   BP Location: Left arm   BP Patient Position: Sitting   BP cuff size: Large   Pulse: 67   Resp: 17   Temp: 98.1 F (36.7 C)   TempSrc: Oral   SpO2: 97%   Height: 6\' 2"  (1.88 m)       GENERAL: The patient is alert, cooperative and in no acute distress.   HEENT: normalcephalic/atraumatic  NECK: midline trachea. No jugular venous distention  PULM: no evidence of labored breathing  GI: soft, NT, ND, no massess.  No hepatosplenomegaly  BACK: No CVAT  EXTREMITES: Joints are normal without redness or swelling.   SKIN: There is no edema or cyanosis.   NEURO: no motor or sensory deficits.  Alert and Oriented x 3    Labs/Diagnostic X-rays:  No results found for: WBC, RBC, HGB, HCT, MCV, MCHC, RDW, PLT, MPV, SEGS, LYMPHS, MONOS, EOS, BASOS    Lab Results   Component Value Date    CREAT 0.89 07/26/2015       Lab Results   Component Value Date    PSA 2.12 05/08/2016       No results found for: TESTOSTERONE    No results found for: COLORUA, APPEARUA, GLUCOSEUA, BILIUA, KETONEUA, SGUA, BLOODUA, PHUA, PROTEINUA, UROBILUA, NITRITEUA, LEUKESTUA, WBCUA, RBCUA, EPITHCELLSUA, HYALINEUA, CRYSTALSUA, COMMENTSUA      Assessment/Plan:   66 year old gentleman doing very well status post transurethral needle ablation of the prostate using steam.    He has ongoing urinary tract symptoms in keeping with his bladder diverticula.  He is requesting diverticulectomy.    He has 2 large diverticulae.    He was consented for diverticulectomy with possible ureteral reimplantation.  This is to be done in a robotic assisted laparoscopic, possible open fashion.    Risks outlined to the patient included  bleeding, infection, damage to adjacent structures, urine leak, the potential need for ureteral reimplantation, heart attack stroke and death.    Lucretia Roers MD, PhD, [facility]          Thank you for allowing me to participate in this patient's care.   Sincerely,   Patty Sermons Lucianna Ostlund

## 2016-10-12 ENCOUNTER — Encounter (HOSPITAL_BASED_OUTPATIENT_CLINIC_OR_DEPARTMENT_OTHER): Payer: Self-pay | Admitting: Urology

## 2016-10-12 ENCOUNTER — Ambulatory Visit: Payer: Commercial Managed Care - HMO | Attending: Urology | Admitting: Urology

## 2016-10-12 VITALS — BP 138/67 | HR 67 | Temp 98.1°F | Resp 17 | Ht 74.0 in

## 2016-10-12 DIAGNOSIS — R350 Frequency of micturition: Secondary | ICD-10-CM | POA: Insufficient documentation

## 2016-10-12 DIAGNOSIS — N400 Enlarged prostate without lower urinary tract symptoms: Principal | ICD-10-CM | POA: Insufficient documentation

## 2016-10-12 DIAGNOSIS — N323 Diverticulum of bladder: Secondary | ICD-10-CM | POA: Insufficient documentation

## 2016-10-12 NOTE — Patient Instructions (Addendum)
Jessica will call you to schedule surgery.      A Karim Kader, MD, phD, FRCSC  Professor of Surgery  Department of Urology    To schedule a future appointment call [phone]  For Prescription refills please call [phone].            [facility] - Urology  Monday-Friday 8:00- 5:00p.m. (Closed Holidays and Weekends)    AFTER HOURS EMERGENCY NUMBER                  [phone]  Ask for the Urologist/Oncologist Physician On-Call.      Tashyra Adduci, RN  Phone:  [phone]  Please note my phone number is for non-urgent   clinical questions that can be answered in the next 24 hours.        Administrative Assistant  Jessica [phone]  Fax [phone]    Infusion Center [phone]  option 2  Call to schedule, cancel, or reschedule chemotherapy appointments.  Infusion Center hours are Monday - Friday 07:30 - 9:30 and   Holidays and Weekends are 8am-6pm    Radiation Oncology [phone]    Call to schedule, cancel, or reschedule radiation appointments    Interventional Radiology Scheduling  [phone]    To Schedule MRI  [phone] or [phone]     Please contact the [place] Radiology Scheduling line at   [phone] to schedule your US, CT Scans or studies.      Social Worker: Carolina Herrera, LCSW  [phone]    Financial Counselor: Marisela Villanueva  [phone]    [facility]  [phone]

## 2016-10-14 LAB — URINE CULTURE: Urine Culture Result: NO GROWTH

## 2016-10-18 DIAGNOSIS — N323 Diverticulum of bladder: Secondary | ICD-10-CM

## 2016-10-25 ENCOUNTER — Telehealth (HOSPITAL_BASED_OUTPATIENT_CLINIC_OR_DEPARTMENT_OTHER): Payer: Self-pay

## 2016-10-25 NOTE — Telephone Encounter (Signed)
Called pt on his preferred number listed. There was no answer, I left pt a message to return the call to confirm his surgery information.     Plan:  [facility]: 11/26/16 at 1:30pm  DOS: 12/07/16 at 7:20am check in at 5:20am

## 2016-11-23 ENCOUNTER — Other Ambulatory Visit (HOSPITAL_BASED_OUTPATIENT_CLINIC_OR_DEPARTMENT_OTHER): Payer: Self-pay | Admitting: Urology

## 2016-11-23 LAB — EMMI , PATIENT SATISFACTION: HOSPITAL DISCHARGE EXPECTATIONS: EMMI Video Order Number: 13895069675

## 2016-11-23 NOTE — Telephone Encounter (Signed)
Per patient he does not want to coordinate surgery on 12/07/16 as his new insurance is coming in November. Will cxl surgery and all [facility] appt's. Pt to call back when he has insurance information taken care of.

## 2016-11-26 ENCOUNTER — Ambulatory Visit (HOSPITAL_BASED_OUTPATIENT_CLINIC_OR_DEPARTMENT_OTHER): Payer: Commercial Managed Care - HMO

## 2016-12-14 ENCOUNTER — Encounter (HOSPITAL_BASED_OUTPATIENT_CLINIC_OR_DEPARTMENT_OTHER): Payer: Commercial Managed Care - HMO | Admitting: Urology

## 2017-01-23 ENCOUNTER — Telehealth (HOSPITAL_BASED_OUTPATIENT_CLINIC_OR_DEPARTMENT_OTHER): Payer: Self-pay | Admitting: Urology

## 2017-01-23 NOTE — Telephone Encounter (Signed)
Pt requesting to reschedule diverticulectomy as previously consented on 8/17.    Routed to [employer], AA to assist with scheduling.

## 2017-01-23 NOTE — Telephone Encounter (Addendum)
Pt left vm requesting Rx renewal for Bi-mix be sent to [facility] on [address].    No Rx listed in pt's med tab.    Telephone call to pt; states Rx was last renewed in Oct. 2017 by Dr. Nani Skillern.    Per chart, last Bi-mix renewal called in on 12/07/15 by Corene Cornea, RN; no further details mentioned regarding dosage, route, frequency.    Pt states he injects 13 units prn.    Telephone call to [facility]; spoke to pharmacist Jenny Reichmann who confirms Bi-mix renewal from Dr. Nani Skillern was for 0.5-30mg /mL; quantity 13mL. Inject 0.78mL into the side as directed. Rx [id number].    Unable to pend; routed to Dr. Nani Skillern for further instruction.    Routed to [place], Therapist, sports as Juluis Rainier

## 2017-01-24 NOTE — Telephone Encounter (Signed)
Pt was scheduled to come in to be seen with Dr Nani Skillern on 1.3.19 to talk about his surgery with Dr Nani Skillern. Will send message to RN to advise if pt could be seen sooner or if appt date is okay. Pt did not want to hold surgery date before as he wanted to hold off and wait for his insurance cards to come in. Please advise.

## 2017-01-24 NOTE — Telephone Encounter (Signed)
Spoke with patient, informed him Dr. Nani Skillern does not fill Bi-MIx, he must see [facility].  Patient states his old urologist used to fill it for him.  Informed him to either go back to other urologist for ED needs or Dr. Nani Skillern can refer him to [facility].  Patient states he will call his older urologist since he has already established with him.  Lillette Boxer, RN

## 2017-01-25 NOTE — Telephone Encounter (Signed)
L/m on vm for pt to call back.  Caiya Bettes, RN

## 2017-01-31 ENCOUNTER — Telehealth (HOSPITAL_BASED_OUTPATIENT_CLINIC_OR_DEPARTMENT_OTHER): Payer: Self-pay | Admitting: Urology

## 2017-01-31 NOTE — Telephone Encounter (Signed)
Pt is calling in regards to scheduling surgery. Please advise.     [phone]

## 2017-02-05 NOTE — Telephone Encounter (Signed)
I have called pt on his number given. Pt would like to hold a date for surgery with Dr Nani Skillern. Pt is to see MD in early January for surgery talk and signing of consent (update).

## 2017-02-06 NOTE — Telephone Encounter (Signed)
Pt surgery scheduled

## 2017-02-22 ENCOUNTER — Telehealth (HOSPITAL_BASED_OUTPATIENT_CLINIC_OR_DEPARTMENT_OTHER): Payer: Self-pay

## 2017-02-22 NOTE — Telephone Encounter (Signed)
Surgery name: DA VINCI XI ROBOTIC ASSISTED Diverticulectomy      Will send message to MD and RN to review orders (labs, imaging and cath removal orders) in EPIC to make sure appropriate orders are entered prior to pt's surgery. RN to also confirm with pt that they are aware of request.      Patient has been contacted at Ph. # [phone]. Spoke to patient and confirmed all surgery dates below. Surgical letter, bowel prep, and ASA forms have been emailed to [email or web address] per patients request.     Surgery Date: 03/20/17 Check in time: 1:30pm [facility]: 03/13/17 at 9:30am     Is Medical Clearance needed? Not indicated to AA     Cath Removal Order placed: not in epic     Ostomy RN Referral placed: not needed     Sleep Apnea: no     Surgical Consent: has been signed and scanned into media      Auth unit will request as time gets closer per auth teams request.       I have informed the patient to please discontinue taking any aspirin/blood thinning medication and/or anti-inflammatory drugs at least 7 days before their scheduled procedure.      Is the patient taking any aspirin/blood thinning medication that is prescribed under a physician's instructions? no       PRE-OPERATIVE NPO BOWEL PREPARATION     As soon as you wake up on the [place], please have a bowel movement if possible before you leave for the hospital.      Take your regular medications on the [place] (except Aspirin, Coumadin or Plavix).  If you regularly take any medication in the morning, especially insulin or other oral medication for diabetes you must discuss this with your doctor.  Please make sure that your doctor approves all the medications that you are taking.     DO NOT eat or drink anything after midnight on the night before your surgery.     REMEMBER: You cannot take aspirin or other similar medications (Motrin, Ibuprofen, Naprosen) or blood thinners (Coumadin, Plavix) for 1 week prior to this procedure.        If you have  any questions about this Bowel Preparation, please call the GU Oncology Nurse Case Manager          ASPIRIN AND IBUPROFEN CAUTION SHEET  FOR SURGERY PATIENTS     For 7 days prior to surgery, do not take any medication containing aspirin or ibuprofen.  Please refer to the list below for some of the medications that contain aspirin and/or ibuprofen.  If it is necessary for you to take any medication, please use Tylenol or acetaminophen substitutes.     Advil                                                                                Gingkoba  Alka-Seltzer  Liquiprin  Alleve                                                                              Measurin  Anacin                                                                             Midol  APC                                                                                 Motrin  ASA compound                                                                Norgesic  Ascriptin                                                                           Novahistine with APC  Aspergum                                                                        Nuprin  Bufferin                                                                            PAC  CAMA  Percodan  Capron capsules                                                             Phenaphen  Contact                                                                           Phensol  Cope                                                                              Plavix  Coricidin                                                                         Robaxisal  Counterpain                                                                    Sal-Fayne  Daprisal                                                                          Stanback  Darvon compound                                                          Super Anahist  Dolene compound                                                         Government social research officer  Talwin compound  Ecotrin                                                                  Trigesic  Edrisal                                                                   Triphen  Equagesic                                                             Trilisate  Excedrin                                                                Triaminic  Femcaps                                                                Vanquish  Fiorinal                                                                   Vitamin E (or any other               multi-vitamin)  Gingkoba                                                                Zactirin     This list does not include every medication that contains aspirin or ibuprofen.  Before taking any medication prior to or after surgery, please read the label carefully for the active ingredients aspirin, salicylates, and/or ibuprofen.  If the medication contains these ingredients do not use.  Please inform your physician of all medications you are taking, including non-prescription medications.     PRE-OPERATIVE INSTRUCTIONS      If there is any change in your surgery time, you will be contacted by the operating room scheduling team after 5:00 p.m. the day before your surgery   1. For 7 days prior to surgery, please do  not take any medications that contain aspirin, ibuprofen, or any non-steroidal anti-inflammatory medications.  If you must take pain medication, please take Tylenol or an acetaminophen substitute.  2. If you take prescription medications, check with the Anesthesiologist or the nurse in the Evaluation Center about whether you should take your medications on the day of surgery, especially blood pressure and heart medications.    3. If you smoke, do not smoke for  at least 24 hours prior to surgery.  Please be aware that [facility] is a non-smoking facility.  4. Wear comfortable, loose clothing to the hospital.  5. Leave all valuables at home.  This includes jewelry, credit cards, money (except for copayment for discharge medications).  6. ALL jewelry must be removed, including rings, earrings, necklaces, navel rings, etc.  7. If you wear contact lenses or glasses, bring a case for them.  Contact lenses must be removed prior to surgery.  8. Bring your insurance carrier cards and picture I.D. with you.  If your insurance plan is accepted at our [place] and you would like to have your discharge prescriptions filled there, you will need to bring money for your co-payment.  9. Please arrange for transportation home after your surgery and hospital stay. A taxi or shuttle bus is not acceptable! You will need to be accompanied home by a responsible adult.  You may want to have a pillow and/or blanket in the car for the ride home.  10. If you will be staying in the hospital, bring a robe, slippers and any items for grooming that you may need during your stay.   11. Please call your doctor if you have any questions regarding your surgery or if you develop any signs or symptoms of illness (fever, runny nose, cough, sore throat).     NOTE:   Your surgery may have to be cancelled if you:    1) You do not follow the fasting/bowel prep guidelines    2) You arrive late for check in at the hospital  3) You have not arranged for a responsible adult to accompany you home     [name]  [address] [place], [place] [address]    Dear Mr. Bodmer,              This is a letter to confirm your appointments for surgery.  If you have any questions regarding these appointments please feel free to contact me at # [phone].      Confirmation on your appointments for surgery:    Hospital Pre-Evaluation Appointment:   Wednesday- March 13, 2017 at 9:30 am      Please check into the [place]:     Address of [place]:     [address]  [place], [place] [address]    Surgery Check-In:      Wednesday- March 20, 2017 at 1:30 pm   Check in at the Information Desk, located on the first floor of [place]    Address of [facility]:     [place], [place] [address]    MD Appointment:       Friday- March 29, 2017 at 3:#0 pm     Please check into the [place]:     Address of [place]:   [address], [address] [place], [place] [address]    If you have any questions regarding the surgery, please call Dr. Unknown Foley nurse case manager via phone # [phone]      Thank you again for allowing [place] to participate in your care.    Sincerely,    Hart Carwin to Dr. Nani Skillern

## 2017-02-28 ENCOUNTER — Other Ambulatory Visit: Payer: Self-pay

## 2017-02-28 ENCOUNTER — Encounter (HOSPITAL_BASED_OUTPATIENT_CLINIC_OR_DEPARTMENT_OTHER): Payer: Self-pay | Admitting: Urology

## 2017-02-28 ENCOUNTER — Ambulatory Visit: Payer: Medicare Other | Attending: Urology | Admitting: Urology

## 2017-02-28 VITALS — BP 120/77 | HR 97 | Temp 98.1°F | Resp 16 | Ht 74.0 in | Wt 199.0 lb

## 2017-02-28 DIAGNOSIS — N400 Enlarged prostate without lower urinary tract symptoms: Secondary | ICD-10-CM | POA: Insufficient documentation

## 2017-02-28 DIAGNOSIS — C61 Malignant neoplasm of prostate: Principal | ICD-10-CM | POA: Insufficient documentation

## 2017-02-28 DIAGNOSIS — N323 Diverticulum of bladder: Secondary | ICD-10-CM | POA: Insufficient documentation

## 2017-02-28 NOTE — Telephone Encounter (Signed)
Pedro Shannon, I spoke with patient, states he was not aware of appt on 1/16 with pre-op for anesthesia.  Patient will be out of town. Patient would like you to call him to reschedule. thanks

## 2017-02-28 NOTE — Progress Notes (Signed)
Reason for visit:   No chief complaint on file.    Demographics:        Date: 03/02/16       Patient Name: Pedro Shannon       Medical Record #: [id number]       DOB: 11-14-1950       Age: 67 year old       Sex: male    [address], [name] [place], [place] [address]  Ste[place]    HPI:    Pedro Shannon is a 67 year old male with a h/o BPH w/LUTS and no family hx of prostate cancer presenting for reassessment of lower urinary tract symptoms status post REZUM 07/18/2016, incontinence, bladder diverticula, prostate cancer on active surveillance    The reports his symptoms are improved after Rezum. He reports significantly improved frequency every 1-2 hours in the day, and reduced nocturia to 0-4 a night fr. He does report persistent urgency with some occasional drips of incontinence associated with the urgency  reports a medium to good stream, and no straining or hesitancy. He denies hematuria or dysuria. On Flomax 0.8 mg daily SS: 23/5    He also has 2 posterior bladder diverticula visualized on cysto during Rezum that the pt is interested in treating eventually.    He also has a h/o elevated PSA, prostate nodule in the setting of a suspicious MRI, now SP MRI biopsy 10/28/15 for a small focus of Gleason 6 disease. Most recent PSA 05/08/16 was 2.12.      [place] URO IPSS REVIEW FS 10/12/2016 08/30/2016 06/07/2016 11/10/2015   Incomplete Emptying 4 3 4 3    Frequency 5 2 4 3    Intermittencey 2 2 4  0   Urgency 4 3 4 2    Weak Stream 4 1 4 1    Straining 2 1 3  0   Nocturia 4 3 3 3    IPSS Total Score 25 15 26 12    Quality of Life 6 4 5 5        He is sexually active on bi-mix with good results. Having some am erections.    No new onset bone pain, night sweats, fevers, chills or unintended weight loss.    Lab Results   Component Value Date/Time    PSA 2.12 05/08/2016 01:15 PM    PSA 2.92 07/26/2015 03:04 PM        PSA 3.2 01/09/16.    FINAL PATHOLOGIC DIAGNOSIS 10/28/15 reviewed personally:  A: Prostate,  right lateral apex, biopsy  -Benign prostatic tissue.  -See comment.  B: Prostate, right medial apex, biopsy       -Benign prostatic tissue.  C: Prostate, right lateral mid, biopsy       -Benign fibromuscular tissue.  D: Prostate, right medial mid, biopsy       -Benign prostatic tissue.       -See comment.  E: Prostate, right lateral base, biopsy       -Benign prostatic tissue.  F: Prostate, right medial base, biopsy       -Benign prostatic tissue.  G: Prostate, left lateral apex, biopsy       -Benign prostatic tissue.  H: Prostate, left medial apex, biopsy       -Benign prostatic tissue with mild acute inflammation.  I: Prostate, left lateral mid, biopsy  -Adenocarcinoma, Gleason score 3+3=6 ,involving <5% of the length of one  out of one core (Grade group 1; <1  mm tumor length).  -See comment.  J: Prostate, left medial mid, biopsy       -Benign prostatic tissue.  K: Prostate, left lateral base, biopsy       -Benign prostatic tissue.  L: Prostate, left medial base, biopsy       -Benign prostatic tissue.       -Seminal vesicle/ejaculatory duct tissue identified.  M: Prostate, target #1, biopsy       -Benign prostatic tissue.  N: Prostate, target #2, biopsy  -Prostatic tissue with a small focus of atypical glands, cannot exclude  prostatic adenocarcinoma.       -See comment.    MRI PELVIS WO/W CONTRAST 08/25/15 reviewed personally:  1. Two PI-RADS 3 lesions, in the left prostatic transition zone mid-gland to   base, and in the right prostatic mid-gland, both possibly also involving the   peripheral zone.    2. No definite extra-prostatic extension.    3. Multiple incidental findings including bilateral renal hernias, bladder   diverticula, bladder wall thickening, and colonic diverticuli.    Outside Records:  PSA  07/15/15 - 2.4  01/06/15 - 4.3  07/01/14 - 3.0  11/19/13 - 3.7  (% free 9%)  12/19/11 - 3.4  08/03/11 - 3.3  12/18/10 - 3.1  06/17/10 - 3.0    4K Score 02/26/15:    24% - high risk    Pathology 06/16/15:  A.  L  Base  Atypical small acinar proliferation  B.  L Lateral base  Benign prostatic tissue  C.  L Mid  Atypical small acinar proliferation  D.  L Lateral Mid  Benign prostatic tissue  E.  L Apex  Benign prostatic tissue  F.  L Lateral Apex  Benign prostatic tissue  G.  R Base  Benign prostatic tissue  H.  R Lateral base  Benign prostatic tissue  I.  R Mid  Atypical small acinar proliferation  J.  R Lateral Mid  Benign prostatic tissue  K.  R Apex  Benign prostatic tissue  L.  R Lateral Apex  Benign prostatic tissue    MRI 05/30/15:  PIRADS 4 lesion involving left base with possible extracapsular extension     Review of Systems   Review of Systems  Constitutional: Negative  Eyes: Negative  ENT: Negative  Cardiac: Negative  Pulmonary: Negative  Gastrointestional: Negative  Musculoskeletal: Negative  Skin: Hair loss  Neurologic: Negative  Psychiatric: Difficulty Sleeping  Endocrine: Negative  Blood Disease: Negative  Allergy: Negative\    Wellbeing assessment:  Wellbeing screening reviewed.    Pt indicated no needs.    Past Medical History:   Diagnosis Date    Prostate cancer (CMS-HCC)        Past Surgical History:   Procedure Laterality Date    NASAL SEPTOPLASTY      R inguinal hernia         Allergies   Allergen Reactions    Dutasteride Hives    Rogaine [Minoxidil] Hives       Current Outpatient Medications   Medication Sig Dispense Refill    phenazopyridine (PYRIDIUM) 200 MG tablet Take 1 tablet (200 mg) by mouth 3 times daily as needed (dysuria). 21 tablet 0    tamsulosin (FLOMAX) 0.4 MG capsule Take 2 capsules (0.8 mg) by mouth daily. 180 capsule 3     No current facility-administered medications for this visit.        Social History     Socioeconomic History  Marital status: Married     Spouse name: Not on file    Number of children: Not on file    Years of education: Not on file    Highest education level: Not on file   Social Needs    Financial resource strain: Not on file    Food insecurity - worry:  Not on file    Food insecurity - inability: Not on file    Transportation needs - medical: Not on file    Transportation needs - non-medical: Not on file   Occupational History    Not on file   Tobacco Use    Smoking status: Never Smoker    Smokeless tobacco: Never Used   Substance and Sexual Activity    Alcohol use: No    Drug use: No    Sexual activity: Not on file   Other Topics Concern    Not on file   Social History Narrative    Not on file       Physical Exam:   Vitals:    02/28/17 1545   BP: 120/77   BP Location: Left arm   BP Patient Position: Sitting   BP cuff size: Large   Pulse: 97   Resp: 16   Temp: 98.1 F (36.7 C)   TempSrc: Temporal   SpO2: 97%   Weight: 90.3 kg (199 lb)   Height: 6\' 2"  (1.88 m)       GENERAL: The patient is alert, cooperative and in no acute distress.   HEENT: normalcephalic/atraumatic  NECK: midline trachea. No jugular venous distention  PULM: no evidence of labored breathing  GI: soft, NT, ND, no massess.  No hepatosplenomegaly  BACK: No CVAT  EXTREMITES: Joints are normal without redness or swelling.   SKIN: There is no edema or cyanosis.   NEURO: no motor or sensory deficits.  Alert and Oriented x 3    Labs/Diagnostic X-rays:  No results found for: WBC, RBC, HGB, HCT, MCV, MCHC, RDW, PLT, MPV, SEGS, LYMPHS, MONOS, EOS, BASOS    Lab Results   Component Value Date    CREAT 0.89 07/26/2015       Lab Results   Component Value Date    PSA 2.12 05/08/2016       No results found for: TESTOSTERONE    No results found for: COLORUA, APPEARUA, GLUCOSEUA, BILIUA, KETONEUA, SGUA, BLOODUA, PHUA, PROTEINUA, UROBILUA, NITRITEUA, LEUKESTUA, WBCUA, RBCUA, EPITHCELLSUA, HYALINEUA, CRYSTALSUA, COMMENTSUA      Assessment/Plan:   67 year old gentleman status post transurethral needle ablation of the prostate using steam.    He has ongoing urinary tract symptoms in keeping with his bladder diverticula.  He is requesting diverticulectomy.    He has 2 large diverticulae.    He was consented  for diverticulectomy with possible ureteral reimplantation.  This is to be done in a robotic assisted laparoscopic, possible open fashion.    Risks outlined to the patient included bleeding, infection, damage to adjacent structures, urine leak, the potential need for ureteral reimplantation, heart attack stroke and death.      Thank you for allowing me to participate in this patient's care.   Sincerely,   Patty Sermons Terry Abila

## 2017-03-01 NOTE — Telephone Encounter (Signed)
Left msg for pt to call Pre op to reschedule at [phone].

## 2017-03-02 LAB — URINE CULTURE: Urine Culture Result: <10000

## 2017-03-12 NOTE — Telephone Encounter (Signed)
Spoke w/ pt - states he is in [place] and will call to reschedule when he reutrns tomorrow 01/16.    Left msg for pre op at [phone] to cancel appt

## 2017-03-13 ENCOUNTER — Encounter (INDEPENDENT_AMBULATORY_CARE_PROVIDER_SITE_OTHER): Payer: Medicare Other

## 2017-03-29 ENCOUNTER — Ambulatory Visit: Payer: Medicare Other | Attending: Urology | Admitting: Urology

## 2017-03-29 ENCOUNTER — Telehealth (HOSPITAL_BASED_OUTPATIENT_CLINIC_OR_DEPARTMENT_OTHER): Payer: Self-pay | Admitting: Urology

## 2017-03-29 ENCOUNTER — Encounter (HOSPITAL_BASED_OUTPATIENT_CLINIC_OR_DEPARTMENT_OTHER): Payer: Commercial Managed Care - HMO | Admitting: Urology

## 2017-03-29 VITALS — BP 130/92 | HR 79 | Temp 97.9°F | Resp 17 | Ht 74.0 in | Wt 198.5 lb

## 2017-03-29 DIAGNOSIS — N323 Diverticulum of bladder: Principal | ICD-10-CM

## 2017-03-29 NOTE — Telephone Encounter (Signed)
Was informed by MA that patient verbalized frustration with scheduling.  Patient stated, he was not informed his appointment was cancelled, then was told he had a pre-op appointment on 04/03/17, which he would not be able to make.  Patient voiced his frustration with the lack of communication, and would have liked to discuss appointment dates.  Apologized and reassured patient.  Gave patient a print out of his February scheduled appointments.  Discussed dates with patient.  Patient stated he will keep the pre-op appointment on 04/03/17, because he did not want his surgery re-scheduled; however, patient requested clarification on the stoma appointment.  Again, reassured patient, and clarified stoma appointment with Dr. Nani Skillern.  Per Dr. Nani Skillern, patient does not need a stoma appointment.  Informed patient of the change.  Reviewed scheduled appointments once more: pre op on 04/03/17, surgery on /13/19, and post op appointment on 04/23/17.  Patient verbalized understanding. No further questions or concerns.    Informed AA Morene Antu, patient will not need an appointment with stoma nurse, per Dr. Nani Skillern.

## 2017-03-29 NOTE — Telephone Encounter (Signed)
Pt came in for appt today 02/01 and was not aware appt was cancelled as it was a post op for previously cancelled surgery. He received an automated call remind him of appt.    Pt was added onto schedule and saw Lana. Per Elane Fritz pt was informed of appts. States stoma appt not needed per Dr. Nani Skillern, appt cancelled. Elane Fritz states pt will try to make 02/06 [facility] appt work.    Emailed

## 2017-03-29 NOTE — Telephone Encounter (Signed)
SURGERY: DA VINCI XI ROBOTIC ASSISTED Diverticulectomy and cystoscopy with possible ureterectomy.    Spoke to patient and scheduled surgery per Dr. Candi Leash Epic order.    Surgery letter, ASA precaution list and pre-operative instructions have been emailed:[email or web address] per pt patient request.    Patient also aware NPO after midnight prior to surgery.    SURGERY PLAN:  Surgery Date & Location: 02/13 [place]  Check in time:  11 AM  Authorization:  Approved  [facility]: 02/06 H7P  Planned Post-op: 02/26 MD    Is Medical Clearance needed? No    Sleep Apnea: No    Labs: RN to advise pt    Surgical Consent : has been signed and scanned into media    Auth:  Will request as time gets closer per auth teams request.     I have informed the patient to please discontinue taking any aspirin/blood thinning medication and/or anti-inflammatory drugs at lease 7 days before their scheduled procedure.     ASPIRIN AND IBUPROFEN CAUTION SHEET  FOR SURGERY PATIENTS    For 7 days prior to surgery, do not take any medication containing aspirin or ibuprofen.  Please refer to the list below for some of the medications that contain aspirin and/or ibuprofen.  If it is necessary for you to take any medication, please use Tylenol or acetaminophen substitutes.    Advil    Gingkoba  Alka-Seltzer   Liquiprin  Alleve    Measurin  Anacin    Midol  APC     Motrin  ASA compound   Norgesic  Ascriptin    Novahistine with APC  Aspergum    Nuprin  Bufferin    PAC  CAMA   Percodan  Capron capsules   Phenaphen  Contact    Phensol  Cope     Plavix  Coricidin   Robaxisal  Counterpain    Sal-Fayne  Daprisal    Stanback  Darvon compound   Super Anahist  Dolene compound   Synalogos  Dristan    Talwin compound  Ecotrin    Trigesic  Edrisal    Triphen  Equagesic    Trilisate  Excedrin    Triaminic  Femcaps    Vanquish  Fiorinal    Vitamin E (or any other multi-vitamin)  Gingkoba    Zactirin    This list does not include every medication that contains aspirin or  ibuprofen.  Before taking any medication prior to or after surgery, please read the label carefully for the active ingredients aspirin, salicylates, and/or ibuprofen.  If the medication contains these ingredients do not use.  Please inform your physician of all medications you are taking, including non-prescription medications.    PRE-OPERATIVE NPO BOWEL PREPARATION    As soon as you wake up on the [place], please have a bowel movement if possible before you leave for the hospital.     Take your regular medications on the [place] (except Aspirin, Coumadin or Plavix).  If you regularly take any medication in the morning, especially insulin or other oral medication for diabetes you must discuss this with your doctor.  Please make sure that your doctor approves all the medications that you are taking.    DO NOT eat or drink anything after midnight on the night before your surgery.    REMEMBER: You cannot take aspirin or other similar medications (Motrin, Ibuprofen, Naprosen) or blood thinners (coumadin, plavix) for 1 week prior to this procedure.  If you have any questions about this Bowel Preparation, please call the      Urology nurse line at # [phone].      PRE-OPERATIVE INSTRUCTIONS     If there is any change in your surgery time, you will be contacted by the operating room scheduling team after 5:00 p.m. the day before your surgery     1. For 7 days prior to surgery, please do not take any medications that contain aspirin, ibuprofen, or any non-steroidal anti-inflammatory medications.  If you must take pain medication, please take Tylenol or an acetaminophen substitute.  2. If you take prescription medications, check with the Anesthesiologist or your Physician/Cardiologist about whether you should take your medications on the day of surgery, especially blood pressure and heart medications.    3. If you smoke, do not smoke for at least 24 hours prior to surgery.  Please be aware that  [facility] is a non-smoking facility.  4. Wear comfortable, loose clothing to the hospital.  5. Leave all valuables at home.  This includes jewelry, credit cards, money (except for copayment for discharge medications).  6. ALL jewelry must be removed, including rings, earrings, necklaces, navel rings, etc.  7. If you wear contact lenses or glasses, bring a case for them.  Contact lenses must be removed prior to surgery.  8. Bring your insurance carrier cards and picture I.D. with you.  If your insurance plan is accepted at our [place] and you would like to have your discharge prescriptions filled there, you will need to bring money for your co-payment.  9. Please arrange for transportation home after your surgery and hospital stay. A taxi or shuttle bus is not acceptable! You will need to be accompanied home by a responsible adult.  You may want to have a pillow and/or blanket in the car for the ride home.  10. If you will be staying in the hospital, bring a robe, slippers and any items for grooming that you may need during your stay.   11. Please call your doctor if you have any questions regarding your surgery or if you develop any signs or symptoms of illness (fever, runny nose, cough, sore throat).    NOTE:   Your surgery may have to be cancelled if you:    1) You do not follow the fasting/bowel prep guidelines    2) You arrive late for check in at the hospital                  3) You have not arranged for a responsible adult to accompany you home      Hello Pedro Shannon,    First of all, I would like to apologize for the miscommunication in regards to your appointment today, March 29, 2017. I hope you are able to accept my deepest apologies for this inconvenience.    I have attached information regarding your upcoming surgery with Pedro Shannon which include your surgical letter, bowel prep instructions and aspirin/blood thinner restrictions. Please review and contact me with questions  via Tel: [phone]. If you have any clinical questions, please feel free to contact our triage nurse at [phone] or [phone] .    Please kindly reply to this email to acknowledge that you have received your surgery letter, bowel prep, pre-op instructions and aspirin/blood thinner restrictions.      Thank you,  [place] Coordinator  Department of Urology  [facility]  [id number]   [place], [place] [address]  T [phone] F [phone]   [email or web address]

## 2017-03-31 NOTE — Progress Notes (Signed)
Patient left without being seen.

## 2017-04-01 ENCOUNTER — Encounter: Payer: Self-pay | Admitting: Hospital

## 2017-04-03 ENCOUNTER — Encounter (INDEPENDENT_AMBULATORY_CARE_PROVIDER_SITE_OTHER): Payer: Self-pay | Admitting: Nurse Practitioner

## 2017-04-03 ENCOUNTER — Ambulatory Visit (INDEPENDENT_AMBULATORY_CARE_PROVIDER_SITE_OTHER): Payer: Medicare Other | Admitting: Nurse Practitioner

## 2017-04-03 VITALS — BP 134/96 | HR 74 | Temp 97.9°F | Resp 16 | Ht 72.0 in | Wt 198.4 lb

## 2017-04-03 DIAGNOSIS — Z01818 Encounter for other preprocedural examination: Secondary | ICD-10-CM

## 2017-04-03 NOTE — H&P (Cosign Needed)
Anesthesia Consult/Preoperative History & Physical Exam    C/C:  Preoperative evaluation for upcoming surgery/procedure under anesthesia.     Referring Physician: Milon Dikes*  Primary Care Physician: Cleopatra Cedar     HPI:  Pedro Shannon is a 67 year old male who was seen by preoperative clinic for evaluation prior to [place] and cystoscopy with possible ureterectomy.  Patient is scheduled on DOS:  2/13  at [facility] . Current symptoms include  Bladder diverticulum .        There were no vitals taken for this visit.         Primary language spoken:  English    ROS/Medical History:       History of Present Illness: 66 yo male with bladder diverticulum scheduled for robotic diverticulectomy and cystoscopy possible ureterectomy 2/13     General:  negative for General ROS   Cardiovascular:  negative cardio ROS     Anesthesia History:  negative anesthesia history ROS  06/2016  LMA used  Pulmonary:   no asthma,  no COPD,  no home oxygen use,  no sleep apnea,  recent URI/pneumonia (6 weks ago resolved ),     Neuro/Psych:   negative neuro/psych ROS   Hematology/Oncology:   history of cancer,  no chemotherapy,  no radiation treatment,      GI/Hepatic:  negative GI/hepatic ROS Infectious Disease:  negative for infectious disease     Renal:  negative renal ROS  Lower urinary tract symptoms status post REZUM 07/18/2016, incontinence, bladder diverticula, prostate cancer on active surveillance Endocrine/Other:  back pain,     Pregnancy History:   Pediatrics:         Pre Anesthesia Testing (PCC/CPC) notes/comments:    [facility] Test & records reviewed by [facility] Provider.         Has labs from 01/2017 in media         Physical Exam    Airway:  Inter-inciser distance 3-4 cm  Prognanth Able    Neck ROM: full  TM distance: 5-6 cm  Short thick neck: No        Cardiovascular:  - cardiovascular exam normal   Rhythm: regular   Rate: normal         Pulmonary:  - pulmonary exam normal         breath sounds clear to auscultation        Neuro/Neck/Skeletal/Skin:  - [place] ANE PHYS EXAM NEGATIVE ROS SKIN SKELETAL NEURO NECK          Dental:  - normal exam    Abdominal:   - normal exam     General: normal weight     Additional Clinical Notes:               Last  OSA (STOP BANG) Score:  No Data Recorded    Last OSA  (STOP) Score for   No Data Recorded                 Past Medical History:   Diagnosis Date    Prostate cancer (CMS-HCC)      Past Surgical History:   Procedure Laterality Date    NASAL SEPTOPLASTY      R inguinal hernia       Social History     Tobacco Use    Smoking status: Never Smoker    Smokeless tobacco: Never Used   Substance Use Topics    Alcohol use:  No    Drug use: No       No current outpatient medications on file.     No current facility-administered medications for this visit.      Allergies   Allergen Reactions    Dutasteride Hives    Rogaine [Minoxidil] Hives       Labs and Other Data  Lab Results   Component Value Date    CREAT 0.89 07/26/2015     In media   Anesthesia Plan:  Risks and Benefits of Anesthesia      I have prescribed the anesthetic plan:                  ASA 3 (Severe systemic disease)                Diagnosis: preop         Anesthesia Assessment:  Pedro Shannon is a 67 year old male with history as stated above presenting to preop clinic for evaluation prior to elective surgery.    -Preoperative history and physical completed today.     Discussed with pt what to expect, risks/benefits  Exact anesthesia plan to be discussed day of surgery  Plan/Recommendations:  -Pedro Shannon is  lowrisk for a  Intermediate   risk for elective  surgery  And can proceed with surgery as planned. This patient is cleared to proceed, subject to surgical location change.     -Patient provided Written Pre Op Instructions (See EPIC Notes/Trans=Pt Instructions)  -Risks/Benefits of Anesthesia discussed, Dental risks explained           Watt Climes ,Nurse Practitioner  3 , MSN, FNP-C  Preoperative Care Clinic  Tel: [phone]

## 2017-04-03 NOTE — Patient Instructions (Signed)
PREOPERATIVE SURGICAL INFORMATION    Your surgery is currently scheduled at [facility] hospital/facility/department on  2/13  With a planned report time of  11 am          FACILITY ADDRESS   [facility], [address] [place], [place] [address]   Please check in at Main Admissions on the 1st floor         QUESTIONS  If you have any questions between now and the day of surgery, please do not hesitate to call:   [place]: [place] ARRIVAL TIME:  On the day of your Surgery/Procedure, please arrive at the time and location provided by your Surgeon/PreOp Team.  If you have any questions regarding your arrival time, please call:   Preoperative Surgical Admissions at [facility]: [phone]        MEDICATION INSTRUCTIONS:     MEDICATIONS TO STOP 7 DAYS BEFORE SURGERY/PROCEDURE:   PLEASE HOLD ASPIRIN AND ALL NSAIDS (non-steroidal anti-inflammatory drugs) or similar drugs SUCH AS advil, aleve, motrin, ibuprofen, relafen, lodine, feldene, Diclofenac, voltaren, indomethacin, naproxen, celebrex, Mobic.    Tylenol (Acetaminophen) products are safe if needed   PLEASE HOLD ALL: vitamins, supplements, herbs & fish oil.        REGULAR PRESCRIPTION MEDICATIONS:   Regular prescription medications SHOULD be taken on the day of surgery with sips of water     AFTER YOUR VISIT WITH [place], IF YOU START TAKING A NEW MEDICATION BEFORE SURGERY, PLEASE CALL us TO MAKE SURE IT IS SAFE TO TAKE & WON'T EFFECT YOUR SURGERY.        TO DO LIST:       SLEEP APNEA: DO NOT use sedatives or drink alcohol while on pain medications & IF you use a CPAP machine bring your mask & Tubing with you the day of surgery.     EATING/DRINKING   PLEASE DO NOT EAT OR DRINK  ANYTHING AFTER MIDNIGHT THE NIGHT BEFORE SURGERY.        Preparing for your Surgery:    Additional information about what to expect before & after surgery is available online  at:  [email or web address] for [place]   Do not shave or remove body hair. Facial shaving is permitted. If you are having head surgery, ask your surgeon whether you can shave.   Please wear clean loose-fitting clothes.   Bring a picture ID and your insurance card, and be prepared to pay your deductible or co-insurance by cash, check, or credit card when you arrive.   If you are going home after surgery, please make sure to arrange for an ADULT to drive you home. If you do not do so, your surgery may be cancelled.    If you are a woman of child bearing age, please note that you may be asked to give a urine sample upon checkin.    If you have  cold symptoms  that do not improve over the next 2-3 days (worsening cough, sinus congestion, development of fever/chills), please call [facility] at [phone]. Having a cold increases your risk for severe lung complications during anesthesia and can result in prolonged hospitalization.    On The Day of Your Surgery:    Check in at the location mentioned above.   You will meet your Anesthesia and surgery teams before surgery   Once surgery is over, you will wake up in the recovery room,  where you will be able to see your friends/family.   Once your time in the recovery room is complete, you will either go home or be admitted as planned.   If you go home, someone will need to stay with you for the first 24 hours after surgery.     Additional information about what to expect before & after surgery is available online at:  [email or web address]    Or by searching You-tube for [place] before surgery and [place] after surgery  [email or web address]  You medical records are available to you at [email or web address]  Select create account.

## 2017-04-03 NOTE — Anesthesia Preprocedure Evaluation (Addendum)
ANESTHESIA PRE-OPERATIVE EVALUATION    Patient Information    Name: Pedro Shannon    MRN: [id number]    DOB: [birth year]    Age: 67 year old    Sex: male  Procedure(s):  DA VINCI XI ROBOTIC ASSISTED Diverticulectomy and cystoscopy with possible ureterectomy.      Pre-op Vitals:   There were no vitals taken for this visit.        Primary language spoken:  English    ROS/Medical History:       History of Present Illness: 67 yo male with bladder diverticulum scheduled for robotic diverticulectomy and cystoscopy possible ureterectomy 2/13     General:  negative for General ROS   Cardiovascular:  negative cardio ROS     Anesthesia History:  negative anesthesia history ROS  06/2016  LMA used  Pulmonary:   no asthma,  no COPD,  no home oxygen use,  no sleep apnea,  recent URI/pneumonia (6 weks ago resolved ),     Neuro/Psych:   negative neuro/psych ROS   Hematology/Oncology:   history of cancer,  no chemotherapy,  no radiation treatment,      GI/Hepatic:  negative GI/hepatic ROS Infectious Disease:  negative for infectious disease     Renal:  negative renal ROS  Lower urinary tract symptoms status post REZUM 07/18/2016, incontinence, bladder diverticula, prostate cancer on active surveillance Endocrine/Other:  back pain,     Pregnancy History:   Pediatrics:         Pre Anesthesia Testing (PCC/CPC) notes/comments:    [facility] Test & records reviewed by [facility] Provider.                      Has labs from 01/2017 in media              Physical Exam    Airway:  Inter-inciser distance 3-4 cm  Prognanth Able    Neck ROM: full  TM distance: 5-6 cm  Short thick neck: No        Cardiovascular:  - cardiovascular exam normal   Rhythm: regular   Rate: normal         Pulmonary:  - pulmonary exam normal      breath sounds clear to auscultation        Neuro/Neck/Skeletal/Skin:  - [place] ANE PHYS EXAM NEGATIVE ROS SKIN SKELETAL NEURO NECK          Dental:  - normal exam    Abdominal:   - normal exam     General: normal weight     Additional  Clinical Notes:               Last  OSA (STOP BANG) Score:  No Data Recorded    Last OSA  (STOP) Score for   No Data Recorded                 Past Medical History:   Diagnosis Date    Prostate cancer (CMS-HCC)      Past Surgical History:   Procedure Laterality Date    NASAL SEPTOPLASTY      R inguinal hernia       Social History     Tobacco Use    Smoking status: Never Smoker    Smokeless tobacco: Never Used   Substance Use Topics    Alcohol use: No    Drug use: No       No current outpatient medications on file.  No current facility-administered medications for this visit.      Allergies   Allergen Reactions    Dutasteride Hives    Rogaine [Minoxidil] Hives       Labs and Other Data  Lab Results   Component Value Date    CREAT 0.89 07/26/2015     In media   Anesthesia Plan:  Risks and Benefits of Anesthesia      I have prescribed the anesthetic plan:                  ASA 3 (Severe systemic disease)

## 2017-04-08 ENCOUNTER — Other Ambulatory Visit (INDEPENDENT_AMBULATORY_CARE_PROVIDER_SITE_OTHER): Payer: Medicare Other

## 2017-04-08 DIAGNOSIS — N390 Urinary tract infection, site not specified: Secondary | ICD-10-CM | POA: Diagnosis present

## 2017-04-08 DIAGNOSIS — N400 Enlarged prostate without lower urinary tract symptoms: Secondary | ICD-10-CM | POA: Diagnosis present

## 2017-04-08 DIAGNOSIS — Z8546 Personal history of malignant neoplasm of prostate: Secondary | ICD-10-CM

## 2017-04-08 DIAGNOSIS — C61 Malignant neoplasm of prostate: Secondary | ICD-10-CM

## 2017-04-08 DIAGNOSIS — N323 Diverticulum of bladder: Principal | ICD-10-CM | POA: Diagnosis present

## 2017-04-08 DIAGNOSIS — I10 Essential (primary) hypertension: Secondary | ICD-10-CM | POA: Diagnosis present

## 2017-04-08 NOTE — Interdisciplinary (Signed)
Urine collection

## 2017-04-09 ENCOUNTER — Encounter (INDEPENDENT_AMBULATORY_CARE_PROVIDER_SITE_OTHER): Payer: Medicare Other

## 2017-04-09 NOTE — Telephone Encounter (Signed)
Left msg for pt and confirmed surgery details below, request call back to confirm.    Surgeon: Dr. Nani Skillern  Date: 04/10/17  Check in: 11:00 AM  Location: [facility]    Driver needed  NPO   Blood thinners stopped as instructed

## 2017-04-10 ENCOUNTER — Encounter (HOSPITAL_COMMUNITY): Admission: RE | Disposition: A | Discharge status: Home Health | Payer: Self-pay | Attending: Urology

## 2017-04-10 ENCOUNTER — Inpatient Hospital Stay
Admission: RE | Admit: 2017-04-10 | Discharge: 2017-04-11 | DRG: 654 | Disposition: A | Discharge status: Home / Self Care | Payer: Medicare Other | Attending: Urology | Admitting: Urology

## 2017-04-10 ENCOUNTER — Inpatient Hospital Stay (HOSPITAL_COMMUNITY): Payer: Medicare Other | Admitting: Anesthesiology

## 2017-04-10 ENCOUNTER — Inpatient Hospital Stay (HOSPITAL_COMMUNITY): Payer: Medicare Other | Admitting: Nurse Practitioner

## 2017-04-10 DIAGNOSIS — N401 Enlarged prostate with lower urinary tract symptoms: Secondary | ICD-10-CM

## 2017-04-10 DIAGNOSIS — N400 Enlarged prostate without lower urinary tract symptoms: Secondary | ICD-10-CM

## 2017-04-10 DIAGNOSIS — N323 Diverticulum of bladder: Secondary | ICD-10-CM

## 2017-04-10 DIAGNOSIS — Z87440 Personal history of urinary (tract) infections: Secondary | ICD-10-CM

## 2017-04-10 LAB — URINE CULTURE: Urine Culture Result: NO GROWTH

## 2017-04-10 SURGERY — DA VINCI XI- CYSTECTOMY, ROBOT-ASSISTED
Anesthesia: General | Site: Pelvis | Wound class: Class II (Clean Contaminated)

## 2017-04-10 MED ORDER — CEFAZOLIN SODIUM 1 GM IJ SOLR
INTRAMUSCULAR | Status: DC | PRN
Start: 2017-04-10 — End: 2017-04-11
  Administered 2017-04-10: 2000 mg via INTRAVENOUS

## 2017-04-10 MED ORDER — DIPHENHYDRAMINE HCL 50 MG/ML IJ SOLN
12.5000 mg | Freq: Once | INTRAMUSCULAR | Status: DC | PRN
Start: 2017-04-10 — End: 2017-04-10
  Administered 2017-04-10: 12.5 mg via INTRAVENOUS
  Filled 2017-04-10: qty 50

## 2017-04-10 MED ORDER — HYDROCODONE-ACETAMINOPHEN 5-325 MG OR TABS
1.0000 | ORAL_TABLET | ORAL | Status: DC | PRN
Start: 2017-04-11 — End: 2017-04-11

## 2017-04-10 MED ORDER — NALOXONE HCL 0.4 MG/ML IJ SOLN
0.1000 mg | INTRAMUSCULAR | Status: DC | PRN
Start: 2017-04-10 — End: 2017-04-10

## 2017-04-10 MED ORDER — ACETAMINOPHEN 325 MG PO TABS
650.0000 mg | ORAL_TABLET | Freq: Four times a day (QID) | ORAL | Status: DC | PRN
Start: 2017-04-10 — End: 2017-04-11

## 2017-04-10 MED ORDER — ROCURONIUM BROMIDE 100 MG/10ML IV SOLN
INTRAVENOUS | Status: DC | PRN
Start: 2017-04-10 — End: 2017-04-11
  Administered 2017-04-10: 100 mg via INTRAVENOUS

## 2017-04-10 MED ORDER — SODIUM CHLORIDE 0.9 % IV SOLN
INTRAVENOUS | Status: DC | PRN
Start: 2017-04-10 — End: 2017-04-11
  Administered 2017-04-10: 16:00:00 via INTRAVENOUS

## 2017-04-10 MED ORDER — DEXAMETHASONE SODIUM PHOSPHATE 4 MG/ML IJ SOLN (CUSTOM)
INTRAMUSCULAR | Status: DC | PRN
Start: 2017-04-10 — End: 2017-04-11
  Administered 2017-04-10: 6 mg via INTRAVENOUS

## 2017-04-10 MED ORDER — HYDROMORPHONE HCL 1 MG/ML IJ SOLN
INTRAMUSCULAR | Status: DC | PRN
Start: 2017-04-10 — End: 2017-04-11
  Administered 2017-04-10 (×3): 0.5 mg via INTRAVENOUS
  Administered 2017-04-10: 1 mg via INTRAVENOUS

## 2017-04-10 MED ORDER — FENTANYL CITRATE (PF) 100 MCG/2ML IJ SOLN
25.0000 ug | INTRAMUSCULAR | Status: DC | PRN
Start: 2017-04-10 — End: 2017-04-10
  Administered 2017-04-10 (×2): 25 ug via INTRAVENOUS

## 2017-04-10 MED ORDER — DEXTROSE-KCL-NACL 5-20-0.45 %-MEQ/L-% IV SOLN
INTRAVENOUS | Status: AC
Start: 2017-04-10 — End: 2017-04-11
  Administered 2017-04-10: 21:00:00 via INTRAVENOUS

## 2017-04-10 MED ORDER — ONDANSETRON HCL 4 MG/2ML IV SOLN
4.0000 mg | Freq: Four times a day (QID) | INTRAMUSCULAR | Status: DC | PRN
Start: 2017-04-10 — End: 2017-04-11

## 2017-04-10 MED ORDER — ONDANSETRON HCL 4 MG/2ML IV SOLN
4.0000 mg | Freq: Once | INTRAMUSCULAR | Status: DC | PRN
Start: 2017-04-10 — End: 2017-04-10
  Administered 2017-04-10: 4 mg via INTRAVENOUS
  Filled 2017-04-10: qty 2

## 2017-04-10 MED ORDER — PROPOFOL IV BOLUS 10 MG/ML
INTRAVENOUS | Status: DC | PRN
Start: 2017-04-10 — End: 2017-04-11
  Administered 2017-04-10: 150 mg via INTRAVENOUS

## 2017-04-10 MED ORDER — PROPOFOL 1000 MG/100ML IV EMUL
INTRAVENOUS | Status: DC | PRN
Start: 2017-04-10 — End: 2017-04-11
  Administered 2017-04-10: 75 ug/kg/min via INTRAVENOUS
  Administered 2017-04-10: 25 ug/kg/min via INTRAVENOUS

## 2017-04-10 MED ORDER — ONDANSETRON HCL 4 MG/2ML IV SOLN
INTRAMUSCULAR | Status: DC | PRN
Start: 2017-04-10 — End: 2017-04-11
  Administered 2017-04-10: 4 mg via INTRAVENOUS

## 2017-04-10 MED ORDER — NEOSTIGMINE METHYLSULFATE 10 MG/10ML IV SOLN
INTRAVENOUS | Status: DC | PRN
Start: 2017-04-10 — End: 2017-04-11
  Administered 2017-04-10: 4 mg via INTRAVENOUS

## 2017-04-10 MED ORDER — LABETALOL HCL 5 MG/ML IV SOLN
INTRAVENOUS | Status: DC | PRN
Start: 2017-04-10 — End: 2017-04-11
  Administered 2017-04-10: 5 mg via INTRAVENOUS

## 2017-04-10 MED ORDER — ACETAMINOPHEN 10 MG/ML IV SOLN
INTRAVENOUS | Status: DC | PRN
Start: 2017-04-10 — End: 2017-04-11
  Administered 2017-04-10: 1000 mg via INTRAVENOUS

## 2017-04-10 MED ORDER — FENTANYL CITRATE (PF) 100 MCG/2ML IJ SOLN
50.0000 ug | INTRAMUSCULAR | Status: DC | PRN
Start: 2017-04-10 — End: 2017-04-10
  Administered 2017-04-10: 50 ug via INTRAVENOUS
  Filled 2017-04-10: qty 2

## 2017-04-10 MED ORDER — HYDROMORPHONE HCL 1 MG/ML IJ SOLN
0.5000 mg | INTRAMUSCULAR | Status: DC | PRN
Start: 2017-04-10 — End: 2017-04-10
  Administered 2017-04-10: 0.5 mg via INTRAVENOUS
  Filled 2017-04-10: qty 0.5

## 2017-04-10 MED ORDER — LACTATED RINGERS IV SOLN
INTRAVENOUS | Status: DC | PRN
Start: 2017-04-10 — End: 2017-04-11
  Administered 2017-04-10: 16:00:00 via INTRAVENOUS

## 2017-04-10 MED ORDER — FENTANYL CITRATE (PF) 250 MCG/5ML IJ SOLN
INTRAMUSCULAR | Status: DC | PRN
Start: 2017-04-10 — End: 2017-04-11
  Administered 2017-04-10: 150 ug via INTRAVENOUS
  Administered 2017-04-10: 100 ug via INTRAVENOUS

## 2017-04-10 MED ORDER — LIDOCAINE HCL (CARDIAC) 20 MG/ML IV SOLN
INTRAVENOUS | Status: DC | PRN
Start: 2017-04-10 — End: 2017-04-11
  Administered 2017-04-10: 90 mg via INTRAVENOUS

## 2017-04-10 MED ORDER — MIDAZOLAM HCL 2 MG/2ML IJ SOLN
INTRAMUSCULAR | Status: DC | PRN
Start: 2017-04-10 — End: 2017-04-11
  Administered 2017-04-10: 2 mg via INTRAVENOUS

## 2017-04-10 MED ORDER — SODIUM CHLORIDE 0.9 % IV SOLN
12.5000 mg | Freq: Once | INTRAVENOUS | Status: DC | PRN
Start: 2017-04-10 — End: 2017-04-10

## 2017-04-10 MED ORDER — LIDOCAINE HCL 2% EX GEL (UROJET)
Status: DC | PRN
Start: 2017-04-10 — End: 2017-04-10
  Administered 2017-04-10 (×2): 10 mL via URETHRAL

## 2017-04-10 MED ORDER — LACTATED RINGERS IV SOLN
INTRAVENOUS | Status: DC
Start: 2017-04-10 — End: 2017-04-10

## 2017-04-10 MED ORDER — MORPHINE SULFATE 4 MG/ML IJ SOLN
4.0000 mg | INTRAMUSCULAR | Status: AC | PRN
Start: 2017-04-10 — End: 2017-04-11
  Administered 2017-04-10 – 2017-04-11 (×2): 4 mg via INTRAVENOUS
  Filled 2017-04-10 (×2): qty 1

## 2017-04-10 MED ORDER — LABETALOL HCL 5 MG/ML IV SOLN
5.0000 mg | INTRAVENOUS | Status: DC | PRN
Start: 2017-04-10 — End: 2017-04-10
  Administered 2017-04-10 (×2): 5 mg via INTRAVENOUS
  Filled 2017-04-10: qty 4

## 2017-04-10 MED ORDER — MORPHINE SULFATE 2 MG/ML IJ SOLN
2.0000 mg | INTRAMUSCULAR | Status: AC | PRN
Start: 2017-04-10 — End: 2017-04-11
  Administered 2017-04-10 – 2017-04-11 (×2): 2 mg via INTRAVENOUS
  Filled 2017-04-10 (×2): qty 1

## 2017-04-10 MED ORDER — DOCUSATE SODIUM 100 MG OR CAPS
100.0000 mg | ORAL_CAPSULE | Freq: Two times a day (BID) | ORAL | Status: DC
Start: 2017-04-10 — End: 2017-04-11
  Administered 2017-04-10 – 2017-04-11 (×2): 100 mg via ORAL
  Filled 2017-04-10 (×2): qty 1

## 2017-04-10 MED ORDER — PHENYLEPHRINE DILUTION 100 MCG/ML IJ SOLN
INTRAVENOUS | Status: DC | PRN
Start: 2017-04-10 — End: 2017-04-11
  Administered 2017-04-10: 150 ug via INTRAVENOUS
  Administered 2017-04-10: 100 ug via INTRAVENOUS

## 2017-04-10 MED ORDER — HYDROCODONE-ACETAMINOPHEN 5-325 MG OR TABS
2.0000 | ORAL_TABLET | ORAL | Status: DC | PRN
Start: 2017-04-11 — End: 2017-04-11
  Administered 2017-04-11 (×2): 2 via ORAL
  Filled 2017-04-10 (×2): qty 2

## 2017-04-10 MED ORDER — BELLADONNA ALKALOIDS-OPIUM 16.2-60 MG RE SUPP
1.0000 | Freq: Two times a day (BID) | RECTAL | Status: DC | PRN
Start: 2017-04-10 — End: 2017-04-11
  Administered 2017-04-10: 60 mg via RECTAL
  Filled 2017-04-10 (×2): qty 1

## 2017-04-10 MED ORDER — FAMOTIDINE 20 MG/2ML IV SOLN
INTRAVENOUS | Status: DC | PRN
Start: 2017-04-10 — End: 2017-04-11
  Administered 2017-04-10: 20 mg via INTRAVENOUS

## 2017-04-10 MED ORDER — GLYCOPYRROLATE 0.2 MG/ML IJ SOLN
INTRAMUSCULAR | Status: DC | PRN
Start: 2017-04-10 — End: 2017-04-11
  Administered 2017-04-10: .6 mg via INTRAVENOUS

## 2017-04-10 SURGICAL SUPPLY — 93 items
ABSORBABLE V-LOC 180 CLOSURE 3-0 GR 9" V-20 (Suture) ×4 IMPLANT
APPLICATOR CHLORAPREP 26ML, ~~LOC~~ (Misc Medical Supply) ×4
BAG URINE DRAIN 200Ml (Drains/Catheter/Tubes/Reservoir)
BASIN SINGLE BASIC STERILE (Misc Surgical Supply) ×2 IMPLANT
BENZOIN TINCTURE AMPULE (Misc Medical Supply) IMPLANT
CATHETER COUDE 20FR 2WAY 5CC (Drains/Catheter/Tubes/Reservoir) ×2 IMPLANT
CATHETER FOLEY 12FR 5CC UNCOATED, SILICONE, 2-WAY (Lines/Drains) ×2
CATHETER FOLEY 12FR 5CC UNCOATED, SILICONE, 2-WAY (Procedural wires/sheaths/catheters/balloons/dilators) ×2 IMPLANT
CATHETER FOLEY 14FR 5CC UNCOATED, SILICONE, 2-WAY (Lines/Drains) ×2 IMPLANT
CATHETER FOLEY BARDEX I.C. 18FR 5CC, 2-WAY (Lines/Drains) IMPLANT
CATHETER FOLEY BARDEX I.C. 18FR 5CC, COUNCILL, 2-WAY, SHRT OPEN, RED (Lines/Drains) ×2 IMPLANT
CATHETER FOLEY BARDEX I.C. 20FR 5CC, 2-WAY (Lines/Drains) IMPLANT
CATHETER TIEMANN COUDE 14FR 2-WAY 5CC (Procedural wires/sheaths/catheters/balloons/dilators) ×2 IMPLANT
CLEARIFY SYSTEM VISUALIZATION SCOPE WARMER D-HELP (Lap/Endo/Arthroscopy) ×2 IMPLANT
CLIP APPLIER LIGAMAX 5MM W/ 15 TI CLIPS (Staplers and staple reloads) IMPLANT
CLIP HEM-O-LOK LARGE (Staplers and staple reloads) ×4 IMPLANT
CLIP HEM-O-LOK MED/LGE (Staplers and staple reloads) ×2
CLIPPER BLADE ASSY FOR 9670 CLIPPER (Knives/Blades) ×2 IMPLANT
CONTAINER PRECISION SPECIMEN, 4OZ- STERILE (Misc Medical Supply) ×2 IMPLANT
COVER TIP ACCESSORY DA VINCI S/SI/XI 8MM DISPOSABLE (Lap/Endo/Arthroscopy) ×2
DA VINCI XI ARM DRAPE (Drapes/towels) ×8 IMPLANT
DA VINCI XI COLUMN DRAPE (Drapes/towels) ×2 IMPLANT
DA VINCI XI LONG GRASPER BIPOLAR (Robotic instruments) ×2
DERMABOND ADVANCE 0.7ML (Suture) ×2
DEVICE 10MM ANCHOR TISSUE RETRIEVAL SYSTEM (Misc Medical Supply) IMPLANT
DRAIN BAG URINE 2000 ML (Drains/Catheter/Tubes/Reservoir)
DRAIN RELIAVAC FLAT 10MM X 20CM 0070440 (Lines/Drains) IMPLANT
DRAIN SUCTION EVAC RELIAVAC 100CC (Lines/Drains) IMPLANT
DRAPE ECOLAB SOLUTION WARMER 44" X 44" {ORS-100} (Drapes/towels) ×2
DRESSING MEPILEX BORDER 3 X 3 IN (Dressings/packing) IMPLANT
DRESSING MEPILEX BORDER LITE 2 X 5 IN (Dressings/packing) IMPLANT
DRESSING TELFA STRL 3" X 8" (Dressings/packing) IMPLANT
DRIVER NEEDLE DA VINCI XI LARGE 8MM (Robotic instruments) ×2
ENDOCATCH POUCH 15MM (Misc Medical Supply)
FOLLOWER HEYMAN 20FR (Misc Medical Supply) ×2 IMPLANT
FORCEP DA VINCI XI PROGRASP 8MM (Robotic instruments) ×2
GLOVE BIOGEL INDICATOR UNDERGLOVE SIZE 8 (Gloves/gowns) ×8
GLOVE SURGEON BIOGEL SIZE 7.5 (Gloves/gowns) ×8 IMPLANT
GOWN SIRUS XLG BLUE, AAMI LVL 4 (Gloves/Gowns) ×8
GUIDEWIRE AMPLATZ SUPER STIFF 0.035" X 145CM, 7CM BENTSON (Procedural wires/sheaths/catheters/balloons/dilators) ×2 IMPLANT
GUIDEWIRE SENSOR DUAL FLEX STRAIGHT TIP .035 X 150CM NITINOL (Procedural wires/sheaths/catheters/balloons/dilators) ×2
JMC ONLY- O.R. CAMERA COVER LIGHT, STERILE (Misc Surgical Supply) ×2
KIT POSITIONER PATIENT PINKPAD (Drape/Gowns/Gloves/Pack) ×2
KIT UROSTOMY POSTOP SYS 45M (Kits/Sets/Trays) ×2
NEEDLE DRIVER DA VINCI XI LARGE 8MM (Robotic instruments) ×2 IMPLANT
PAD GROUND VALLEYLAB REM ADULT E7507 (Misc Surgical Supply) ×2 IMPLANT
PORT ACCESS AIRSEAL 12MM X 100MM BLDLESS OBTURATOR (Lap/Endo/Arthroscopy) ×2
POSITIONER ARM SURG (Misc Medical Supply) ×2 IMPLANT
PREP TRAY WET SKIN SCRUB KENDALL (Prep Solutions) ×2
PROTECTOR ULNAR NERVE PAD, YELLOW (Misc Medical Supply) ×2 IMPLANT
RELOAD ENDOPATH ECHELON FLEX 60MM, WHITE (Staplers and staple reloads)
SCISSORS DA VINCI XI HOT SHEARS MONOPOLAR CURVED 8MM (Robotic instruments) ×2
SCRUB, SURGICAL, EXIDINE, 4%, CHG, 4OZ (Misc Medical Supply) IMPLANT
SEAL DA VINCI XI UNIVERSAL 5-8MM (Lap/Endo/Arthroscopy) ×2 IMPLANT
SET TUBING PNEUMOCLEAR SMOKE EVACUATION HIGH FLOW (Tubing/Suction) ×2 IMPLANT
SKIN AFFIX 0.4ML HV (Dressings/packing)
SKIN PREP -BATH CLOTH CHG 2% (Misc Medical Supply) IMPLANT
SLEEVE SCD KNEE MEDIUM (Misc Medical Supply) ×2
SOLUTION IRR POUR BTL 0.9% NS 1000ML (Non-Pharmacy Meds/Solutions) ×4
SOLUTION IRR POUR BTL H20 1000ML (Non-Pharmacy Meds/Solutions) IMPLANT
SOLUTION IV 0.9% NS 1000ML (Non-Pharmacy Meds/Solutions) ×2 IMPLANT
STAPLE RELOAD ECHELON ENDOPATH BLUE H 1.5MM L 60MM (Staplers and staple reloads) IMPLANT
STAPLE RELOAD ECHELON GREEN H 2MM L 60MM (Staplers and staple reloads)
STAPLER ECHELON FLEX 60 POWERED ENDOSCOPIC LONG PLEE60A (Staplers and staple reloads)
SUCTION IRRIGATOR STRYKEFLOW2- SINGLE USE (Lap/Endo/Arthroscopy) ×2 IMPLANT
SURGICAL PACK ROBOTIC SURGERY (Procedure Packs/kits) ×2 IMPLANT
SUTURE CLIPS LAPRA- TY 0.118" X 0.12" POLYMER, ABSORBABLE (Suture) ×2
SUTURE ETHILON 2-0 18" FS (Suture) IMPLANT
SUTURE MONOCRYL PLUS 4-0 27" PS-2 MCP426 (Suture) ×4
SUTURE PERMA-HAND SILK 3-0 12 18" A184H (Suture) ×4
SUTURE PERMA-HAND SILK 3-0 18" SH C013D (Suture) IMPLANT
SUTURE PROLENE 4-0 18 RB1 (Suture)
SUTURE STRATAFIX 2-0 15CM SPIRAL PDS PLUS VIOLET CT-2 SXPP1B413 (Suture)
SUTURE STRATAFIX 3-0 20CM SPIRAL PGA/PCL (Suture)
SUTURE STRATAFIX PDS PLUS 1 CT-1 45CM, 1/2 CIRCLE (Suture) IMPLANT
SUTURE STRATAFIX PDS PLUS 1 CT-1 45CM, ½ CIRCLE (Suture)
SUTURE STRATAFIX SPIRAL 3-0 16 X 16CM SXMD2B402 (Suture) IMPLANT
SUTURE VICRYL PLUS 0 36" CT-1 VCP946 (Suture)
SUTURE VICRYL PLUS 2-0 27" SH VCP417 (Suture)
SUTURE VICRYL PLUS 3-0 27" SH VCP316 (Suture) ×4
SUTURE VICRYL PLUS 3-0 27" SH VCP416 (Suture) ×8 IMPLANT
SUTURE VICRYL PLUS 4-0 27" RB-1 (Suture) ×2
SYRINGE CATH TIP 60ML (Misc Surgical Supply) ×2 IMPLANT
SYRINGE HYPO LL 20CC (Needles/punch/cannula/biopsy) ×2 IMPLANT
TAPE UMBILICAL COTTON 1/8X30 (Misc Medical Supply) IMPLANT
TOWELS OR BLUE 4-PACK STERILE, DISPOSABLE (Drapes/towels) ×6
TRAY PREP DRY SKIN SCRUB (Misc Surgical Supply)
TROCAR ENDOPATH XCEL BLADELESS OPTIVIEW STABILITY SLEEVE 5MM X L100 MM (Lap/Endo/Arthroscopy) ×6
TROCAR ENDOPATH XCEL STABILITY SLEEVE 12MM X L100 MM UNIVERSAL (Lap/Endo/Arthroscopy) ×2
TUBE YANKAUER SUCTION WITH BULBOUS TIP (Misc Medical Supply) ×2 IMPLANT
TUBING AIRSEAL TRI-LUMEN FILTERED (Tubing/Suction) ×2
TUBING CYSTO BLADDER IRRIGATION SET (Tubing/suction) ×2
TUBING SUCTION MEDI-VAC 9/32" X 20' (Tubing/suction) ×2

## 2017-04-10 NOTE — Brief Op Note (Signed)
UROLOGY BRIEF OPERATIVE NOTE    DATE: 04/10/2017  TIME: 7:07 PM      PREOPERATIVE DIAGNOSIS: Bladder diverticuli    POSTOPERATIVE DIAGNOSIS: Same    PROCEDURE:   1. Cystoscopy  2. Robotic assisted laparoscopic bladder diverticulectomy    ATTENDING SURGEON: Kader  ASSISTANTS(s): Karlyn Agee    ANESTHESIA: General  ANTIBIOTICS: Ancef    FINDINGS: Bilobar prostatic hypertrophy with high riding bladder neck, multiple posterior false passages. Two medium sized mouth diverticuli on posterior right bladder wall. Both excised with full thickness watertight closure. Extremely challenging foley catheter placement given posterior false passages. 70F councilized coudet catheter placed over super stiff wire.     WOUND CLASSIFICATION:  Procedure(s):  DA VINCI XI ROBOTIC ASSISTED Diverticulectomy and cystoscopy - Wound Class: Class II (Clean Contaminated) - Incision Closure: Superficial Layers Only    WOUND CLOSURE STATUS:  Procedure(s):  DA VINCI XI ROBOTIC ASSISTED Diverticulectomy and cystoscopy - Wound Class: Class II (Clean Contaminated) - Incision Closure: Superficial Layers Only    SPECIMENS:   ID Type Source Tests Collected by Time Destination   A : Bladder Diverticuli Tissue Bladder PATHOLOGY TISSUE EXAM Cira Servant 04/10/2017 1803        FLUIDS/BLOOD PRODUCTS:    IV Fluids: 1900cc    Blood Products: none    EBL: 10cc    Urine Output: not measured    COMPLICATIONS: none    DISPOSITION: stable and extubated to PACU    POST-OP PLAN  - Admit to urology per robotics pathway  - Catheter for 1 week

## 2017-04-10 NOTE — H&P (Signed)
HISTORY & PHYSICAL - INTERVAL ASSESSMENT    **ONLY TO BE USED IN ADDITION TO A HISTORY & PHYSICAL**    Pedro Shannon  [id number]      This interval assessment is required for History & Physical completed less than 30 days prior to the admission or surgery. A History & Physical completed more than 30 days prior to the admission or surgery must be repeated.    Current Medical Status:  Unchanged    Medications / Allergies:  Unchanged    Review of Systems:  Unchanged    Physical Examination:  I have examined the patient today.  Unchanged    Laboratory or Clinical Data:  Unchanged    Urine Culture negative    Modifications of Initial Care Plan:  Unchanged        Pedro Shannon     04/10/17     2:40 PM

## 2017-04-10 NOTE — Plan of Care (Signed)
Problem: Promotion of Perioperative Health and Safety  Goal: Promotion of Health and Safety of the Perioperative Patient  The patient remains safe, receives treatment appropriate to the surgical intervention and patient's physiological needs and is discharged or transferred to the appropriate level of care.    Information below is the current care plan.  Outcome: Progressing   04/10/17 1848   Perioperative Plan of Care   Guidelines PACU   Individualized Interventions/Recommendations #1 Pain controlled with ordered analgesia   Individualized Interventions/Recommendations #2 (if applicable) return to neurological baseline   Individualized Interventions/Recommendations #3 (if applicable) hemodynamically at goal   Patient /Family stated Goal   Patient /Family stated Goal Deferred

## 2017-04-11 ENCOUNTER — Other Ambulatory Visit (HOSPITAL_BASED_OUTPATIENT_CLINIC_OR_DEPARTMENT_OTHER): Payer: Self-pay | Admitting: Registered Nurse

## 2017-04-11 ENCOUNTER — Encounter (HOSPITAL_COMMUNITY): Payer: Self-pay | Admitting: Registered Nurse

## 2017-04-11 ENCOUNTER — Inpatient Hospital Stay (HOSPITAL_COMMUNITY): Payer: Medicare Other

## 2017-04-11 DIAGNOSIS — Z9889 Other specified postprocedural states: Secondary | ICD-10-CM

## 2017-04-11 DIAGNOSIS — R7989 Other specified abnormal findings of blood chemistry: Secondary | ICD-10-CM

## 2017-04-11 DIAGNOSIS — K668 Other specified disorders of peritoneum: Secondary | ICD-10-CM

## 2017-04-11 LAB — MAGNESIUM, BLOOD: Magnesium: 1.9 mg/dL (ref 1.6–2.4)

## 2017-04-11 LAB — HEMOGRAM, BLOOD
Hct: 46.3 % (ref 40.0–50.0)
Hgb: 15.5 gm/dL (ref 13.7–17.5)
MCH: 32.7 pg — ABNORMAL HIGH (ref 26.0–32.0)
MCHC: 33.5 g/dL (ref 32.0–36.0)
MCV: 97.7 um3 — ABNORMAL HIGH (ref 79.0–95.0)
MPV: 10.5 fL (ref 9.4–12.4)
Plt Count: 232 10*3/uL (ref 140–370)
RBC: 4.74 10*6/uL (ref 4.60–6.10)
RDW: 13.2 % (ref 12.0–14.0)
WBC: 11.5 10*3/uL — ABNORMAL HIGH (ref 4.0–10.0)

## 2017-04-11 LAB — PHOSPHORUS, BLOOD: Phosphorous: 2.1 mg/dL — ABNORMAL LOW (ref 2.7–4.5)

## 2017-04-11 LAB — BASIC METABOLIC PANEL, BLOOD
Anion Gap: 11 mmol/L (ref 7–15)
BUN: 18 mg/dL (ref 8–23)
Bicarbonate: 24 mmol/L (ref 22–29)
Calcium: 8.4 mg/dL — ABNORMAL LOW (ref 8.5–10.6)
Chloride: 103 mmol/L (ref 98–107)
Creatinine: 1.43 mg/dL — ABNORMAL HIGH (ref 0.67–1.17)
GFR: 50 mL/min
Glucose: 155 mg/dL — ABNORMAL HIGH (ref 70–99)
Potassium: 4.9 mmol/L (ref 3.5–5.1)
Sodium: 138 mmol/L (ref 136–145)

## 2017-04-11 MED ORDER — OXYCODONE HCL 5 MG OR TABS
5.0000 mg | ORAL_TABLET | Freq: Once | ORAL | Status: AC
Start: 2017-04-11 — End: 2017-04-11
  Administered 2017-04-11: 5 mg via ORAL
  Filled 2017-04-11: qty 1

## 2017-04-11 MED ORDER — HYDROCODONE-ACETAMINOPHEN 5-325 MG OR TABS
ORAL_TABLET | ORAL | 0 refills | Status: AC
Start: 2017-04-11 — End: 2017-10-08

## 2017-04-11 MED ORDER — ACETAMINOPHEN 325 MG PO TABS
650.00 mg | ORAL_TABLET | ORAL | 0 refills | Status: AC
Start: 2017-04-11 — End: 2018-04-11

## 2017-04-11 MED ORDER — DOCUSATE SODIUM 100 MG OR CAPS
100.00 mg | ORAL_CAPSULE | ORAL | 0 refills | Status: DC
Start: 2017-04-11 — End: 2019-12-28

## 2017-04-11 MED ORDER — SULFAMETHOXAZOLE-TRIMETHOPRIM 800-160 MG OR TABS
1.0000 | ORAL_TABLET | Freq: Two times a day (BID) | ORAL | 0 refills | Status: DC
Start: 2017-04-17 — End: 2017-04-20

## 2017-04-11 MED ORDER — PHENOL 1.4 % MT LIQD
3.0000 | OROMUCOSAL | Status: DC | PRN
Start: 2017-04-11 — End: 2017-04-11
  Administered 2017-04-11 (×2): 3 via OROMUCOSAL
  Filled 2017-04-11: qty 177

## 2017-04-11 MED ORDER — HYDROCODONE-ACETAMINOPHEN 5-325 MG OR TABS
0.5000 | ORAL_TABLET | Freq: Three times a day (TID) | ORAL | 0 refills | Status: DC | PRN
Start: 2017-04-11 — End: 2017-04-20

## 2017-04-11 MED ORDER — SULFAMETHOXAZOLE-TRIMETHOPRIM 800-160 MG OR TABS
ORAL_TABLET | ORAL | 0 refills | Status: DC
Start: 2017-04-11 — End: 2017-05-20

## 2017-04-11 MED ORDER — OXYBUTYNIN CHLORIDE 5 MG OR TABS
5.0000 mg | ORAL_TABLET | Freq: Once | ORAL | Status: AC
Start: 2017-04-11 — End: 2017-04-11
  Administered 2017-04-11: 5 mg via ORAL
  Filled 2017-04-11: qty 1

## 2017-04-11 MED ORDER — DOCUSATE SODIUM 100 MG OR CAPS
100.0000 mg | ORAL_CAPSULE | Freq: Two times a day (BID) | ORAL | 0 refills | Status: DC
Start: 2017-04-11 — End: 2017-04-20

## 2017-04-11 MED ORDER — SODIUM PHOSPHATE 10 MEQ/50 ML NS (~~LOC~~)
10.0000 meq | Freq: Once | Status: AC
Start: 2017-04-11 — End: 2017-04-11
  Administered 2017-04-11: 10 meq via INTRAVENOUS
  Filled 2017-04-11: qty 50

## 2017-04-11 MED ORDER — MAGNESIUM HYDROXIDE 400 MG/5ML OR SUSP
30.0000 mL | Freq: Once | ORAL | Status: AC
Start: 2017-04-11 — End: 2017-04-11
  Administered 2017-04-11: 30 mL via ORAL
  Filled 2017-04-11: qty 30

## 2017-04-11 MED ORDER — ACETAMINOPHEN 325 MG PO TABS
650.0000 mg | ORAL_TABLET | Freq: Four times a day (QID) | ORAL | 0 refills | Status: DC | PRN
Start: 2017-04-11 — End: 2017-04-20

## 2017-04-11 MED ORDER — MENTHOL 3 MG MT LOZG
1.0000 | LOZENGE | OROMUCOSAL | Status: DC | PRN
Start: 2017-04-11 — End: 2017-04-11
  Administered 2017-04-11: 3 mg via BUCCAL
  Filled 2017-04-11: qty 1

## 2017-04-11 MED ORDER — SIMETHICONE 80 MG OR CHEW
80.0000 mg | CHEWABLE_TABLET | Freq: Four times a day (QID) | ORAL | Status: DC | PRN
Start: 2017-04-11 — End: 2017-04-11
  Administered 2017-04-11 (×2): 80 mg via ORAL
  Filled 2017-04-11 (×2): qty 1

## 2017-04-11 NOTE — Interdisciplinary (Signed)
Acknowledged  RN Admission Summary Weight Loss Trigger:    Current Wt:   Weights (last 14 days)     Date/Time Weight Weight Source Percentage Weight Change (%) Who    04/10/17 1118  91.1 kg (200 lb 12.8 oz)  Standing scale  0 % JD            Ht:  Ht Readings from Last 1 Encounters:   04/10/17 6' (1.829 m)     IBW:  %IBW:  Body mass index is 27.23 kg/m.  UBW:   Wt Readings from Last 20 Encounters:   04/10/17 91.1 kg (200 lb 12.8 oz)   04/03/17 90 kg (198 lb 6.6 oz)   03/29/17 90 kg (198 lb 8 oz)   02/28/17 90.3 kg (199 lb)   08/30/16 89.3 kg (196 lb 12.8 oz)   07/18/16 88.8 kg (195 lb 11.2 oz)   06/07/16 88.3 kg (194 lb 11.2 oz)   05/10/16 87.1 kg (192 lb)   03/02/16 87.2 kg (192 lb 3.9 oz)   11/10/15 87.4 kg (192 lb 10.9 oz)   10/28/15 85.3 kg (188 lb)   09/16/15 85.3 kg (188 lb 0.8 oz)   07/26/15 87 kg (191 lb 12.8 oz)     Per review of Epic hx, no wt loss noted. Pt weighed ~192# one year ago, and CBW of 200# indicating overall wt gain. Pt currently on a regular diet. Will assess/ screen per department policy.     [place]

## 2017-04-11 NOTE — Discharge Summary (Shared)
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     Past Medical History:   Diagnosis Date    Prostate cancer (CMS-HCC)      Past Surgical History:   Procedure Laterality Date    NASAL SEPTOPLASTY      R inguinal hernia         Additional Hospital Diagnoses ("rule out" or "suspected" diagnoses, etc.):  None    Principal Procedure During This Hospitalization (required):  Procedure(s):  DA VINCI XI ROBOTIC ASSISTED Diverticulectomy and cystoscopy - Wound Class: Class II (Clean Contaminated) - Incision Closure: Superficial Layers Only on 04/10/2017    Other Procedures Performed During This Hospitalization (required):  None    Consultations Obtained During This Hospitalization:  None    Reason for Admission to the Hospital / History of Present Illness:  Pedro Shannon is a 67 year old male who has a past medical history of Prostate cancer (CMS-HCC). 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 Nani Skillern, Madelin Rear.     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 Nani Skillern, Madelin Rear. The patient tolerated the procedure well. Post-operatively, patient was admitted to the hospital for recovery.     #Hypertensive Urgency  Patient had one episode of SBP 180s shortly after his procedure, requiring PRN Labetalol. SBPs remained  150s-160s after this episode, not requiring additional PRNs. Patient was asymptomatic and denied symptoms of chest pain, headache, & vision changes the next morning. The patient had an otherwise uneventful post-operative course.     On day of discharge, the patient's pain was well controlled on oral pain medications, voided without assistance, ambulated without difficulty, and tolerated a Diet Clear Liquid  Diet Regular diet. The patient was discharged without a drain. He was discharged on 1 Day Post-Op to home. Post discharge plan includes follow up with Dr. Nani Skillern, Madelin Rear in 1-2 weeks. Pt expressed understanding of follow up plans.    Tests Outstanding at Discharge Requiring Follow Up:  ID Type Source Tests Collected by Time Destination   A : Bladder Diverticuli Tissue Bladder PATHOLOGY TISSUE EXAM Cira Servant 04/10/2017 1803        Discharge Condition (required):  Stable.    Key Physical Exam Findings at Discharge:  Physical examination is significant for:  General: alert and oriented x3, no apparent distress  Respiratory: nonlabored respirations  Cardiovascular: regular rate and rhythm, well-perfused  Abdomen: soft, appropriately tender, mildly distended  Genitourinary: no CVA tenderness, foley in place draining clear urine    Discharge Diet:    Regular Diet    Discharge Medications:  {Fire This Link Only When ALL Discharge Medication Documentation Has Been Completed:13193}    Allergies:  Allergies   Allergen Reactions    Dutasteride Hives  Rogaine [Minoxidil] Hives       Discharge Disposition:  Home.    Discharge Code Status: Orders Placed This Encounter      Full Code - Call Code    This code status is not changed from the time of admission.    Follow Up Appointments:  Scheduled appointments:  {Fire This Link Only When Discharge Summary is Ready for Final Signature:13194}    For appointments requested for after discharge that have not yet been scheduled, refer to the Post Discharge  Referrals section of the After Visit Summary.

## 2017-04-11 NOTE — Discharge Summary (Signed)
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     Past Medical History:   Diagnosis Date    Prostate cancer (CMS-HCC)      Past Surgical History:   Procedure Laterality Date    NASAL SEPTOPLASTY      R inguinal hernia         Additional Hospital Diagnoses ("rule out" or "suspected" diagnoses, etc.):  Hypertension    Principal Procedure During This Hospitalization (required):  Procedure(s):  DA VINCI XI ROBOTIC ASSISTED Diverticulectomy and cystoscopy - Wound Class: Class II (Clean Contaminated) - Incision Closure: Superficial Layers Only on 04/10/2017    Other Procedures Performed During This Hospitalization (required):  None    Consultations Obtained During This Hospitalization:  None    Reason for Admission to the Hospital / History of Present Illness:  Pedro Shannon is a 67 year old male who has a past medical history of Prostate cancer (CMS-HCC). 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 Nani Skillern, Madelin Rear.     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 Nani Skillern, Madelin Rear. The patient tolerated the procedure well. Post-operatively, patient was admitted to the hospital for recovery.     #Hypertension  Patient had one episode of SBP 180s shortly after his procedure, requiring PRN Labetalol. SBPs remained  150s-160s after this episode, not requiring additional PRNs. Patient was asymptomatic and denied symptoms of chest pain, headache, & vision changes the next morning. The patient had an otherwise uneventful post-operative course.     On day of discharge, the patient's pain was well controlled on oral pain medications, ambulated without difficulty, and tolerated a Diet Regular diet. The patient was discharged without a drain. He was discharged on 1 Day Post-Op to home with a foley. Post discharge plan includes follow up with Dr. Nani Skillern, Madelin Rear in 1-2 weeks. Pt expressed understanding of follow up plans.    Tests Outstanding at Discharge Requiring Follow Up:  ID Type Source Tests Collected by Time Destination   A : Bladder Diverticuli Tissue Bladder PATHOLOGY TISSUE EXAM Cira Servant 04/10/2017 1803        Discharge Condition (required):  Stable.    Key Physical Exam Findings at Discharge:  Physical examination is significant for:  General: alert and oriented x3, no apparent distress  Respiratory: nonlabored respirations  Cardiovascular: regular rate and rhythm, well-perfused  Abdomen: soft, appropriately tender, mildly distended  Genitourinary: no CVA tenderness, foley in place draining clear urine    Discharge Diet:    Regular Diet    Discharge Medications:     What To Do With Your Medications      START taking these medications      Add'l Info   acetaminophen 325 MG tablet  Commonly known as:  TYLENOL  Take 2 tablets (650 mg) by mouth every 6 hours as needed for Mild Pain (Pain Score 1-3).   Quantity:  30 tablet  Refills:  0     docusate sodium 100 MG capsule  Commonly known as:  COLACE  Take 1 capsule (100 mg) by mouth 2 times daily.   Quantity:  60 capsule  Refills:  0     HYDROcodone-acetaminophen 5-325 MG tablet  Commonly known as:  NORCO  Take 0.5 tablets by mouth every 8 hours as needed for Severe Pain (Pain Score 7-10) or Breakthrough Pain.   Quantity:  8 tablet  Refills:  0        sulfamethoxazole-trimethoprim 800-160 MG tablet  Commonly known as:  BACTRIM DS, SEPTRA DS  Take 1 tablet by mouth 2 times daily for 2 doses. Start next week on morning of catheter removal day.  Start taking on:  04/17/2017   Quantity:  2 tablet  Refills:  0           Where to Get Your Medications      These medications were sent to [facility]  [id number], [place] [address]    Hours:  Mon-Fri 8:30am-7:00pm, Sat-Sun 9am-5:00pm Phone:  [phone]    acetaminophen 325 MG tablet   docusate sodium 100 MG capsule   HYDROcodone-acetaminophen 5-325 MG tablet   sulfamethoxazole-trimethoprim 800-160 MG tablet         Allergies:  Allergies   Allergen Reactions    Dutasteride Hives    Rogaine [Minoxidil] Hives       Discharge Disposition:  Home.    Discharge Code Status: Orders Placed This Encounter      Full Code - Call Code    This code status is not changed from the time of admission.    Follow Up Appointments:  Scheduled appointments:  Future Appointments   Date Time Provider [place]   04/23/2017  3:45 PM Kader, [place]       For appointments requested for after discharge that have not yet been scheduled, refer to the Post Discharge Referrals section of the After Visit Summary.

## 2017-04-11 NOTE — Interdisciplinary (Signed)
04/11/17 1416   Provider Notification   Provider Notified Physician   Provider Name 1st call   Method of Communication Text Page   Reason for Communication pt is VERY eager to go home and wants to see if you got the [place] results? Also, he did not eat reg diet for lunch with his abd distended and tender. He's asking if that will make him stay another night? Thank you

## 2017-04-11 NOTE — Interdisciplinary (Signed)
04/11/17 1527   Discharge Planning Needs   Planned living arrangements after discharge: Home with spouse   Legal Designee or Surrogate Decision-Maker Has Been Given Options and Choice In The Selection of Post-Acute Care Providers Yes   Primary family / caregiver name and contact information: Raelyn Ensign Spouse [phone]   Does this patient have CM discharge planning needs? No   Does this patient have SW discharge planning needs? No   Final Discharge Destination   Final Discharge Destination Home   Discharge Transportation   Transportation arrangements: Patient requesting to go home via [employer] Phone Number: Patient to make arrangements

## 2017-04-11 NOTE — Progress Notes (Signed)
History of Present Illness:  [name] is a 67 year old year old male with a history of Gl 3+3 PCA on AS, BPH s/p REZUM (06/2016), and bladder diverticula.      He is POD1 s/p cystoscopy with robotic-assisted laparoscopic diverticulectomy for lower  urinary tract symptoms and urinary tract infections.  Of note, attempts at placement of Foley and coude-tip urinary catheters were unsuccessful before successful placement of a council tip catheter under guidewire.    - Asymptomatic elevated blood pressure yesterday evening 160's-180's/90's-100's, requiring labetalol.  - Moderate abdominal surgical site pain controlled with morphine IV.  - Tolerated clear liquids, denies N/V. Flatus - .    - Ambulated yesterday evening without difficulty.  - Feeling distended although OK overall.    Physical Exam:  Blood pressure 134/88, pulse 92, temperature 99.4 F (37.4 C), resp. rate 18, height 6' (1.829 m), weight 91.1 kg (200 lb 12.8 oz), SpO2 96 %.    02/13 0600 - 02/14 0559  In: 1900 [I.V.:1900]  Out: 425 [Urine:425]    Gen: NAD, A&Ox4.  Resp: Respirations unlabored.  Back: No CVAT.  Abd: Soft, NT, slightly distended, sx sites CDI with ecchymosis at umbilicus and RLQ ix site.     GU: Urethral meatus intact, no lesions.  20 Fr urinary catheter with clear yellow urine.  Extrem: No BLE edema.    Lab Results   Component Value Date    WBC 11.5 (H) 04/11/2017    RBC 4.74 04/11/2017    HGB 15.5 04/11/2017    HCT 46.3 04/11/2017    MCV 97.7 (H) 04/11/2017    MCHC 33.5 04/11/2017    RDW 13.2 04/11/2017    PLT 232 04/11/2017    MPV 10.5 04/11/2017     Lab Results   Component Value Date    NA 138 04/11/2017    K 4.9 04/11/2017    CL 103 04/11/2017    BICARB 24 04/11/2017    BUN 18 04/11/2017    CREAT 1.43 (H) 04/11/2017    GLU 155 (H) 04/11/2017    [place] 8.4 (L) 04/11/2017         Assessment:  67 year old year old male POD1 s/p robotic laparoscopic diverticulectomy - Clinically stable and progressing.  Elevated creatinine noted  this morning, which may be secondary to urine absorption from the surgery; however, will further evaluate with stat renal US this AM.  We reviewed post-operative expectations and recommendations including management of gross hematuria, activity, care of his surgical sites, appropriate pain medication use, constipation prevention, considerations with his indwelling urinary catheter, CDB & IS use, etc.  Discharge planning and education provided.    Plan:  - Stat renal US.  - Ice packs to umbilicus and abd.  - PO pain medication PRN.  - Stool softeners.  - SL IVF this AM.  - Ambulate- start this AM.  - IS 10 times/hr WA.  - Catheter and leg bag teaching this AM.  - Clear liquid diet for breakfast and advance as tolerated to regular for lunch.  - Anticipate d/c home this afternoon pending results of [place] and if pt tolerates regular diet for lunch and ambulates without difficulty.  - RTC 1 week for catheter removal.  - RTC 2 weeks for post-op appt.  - TMP/SMX DS BID for 2 doses, to start on morning of catheter removal day.    I spent 35 minutes face to face with the patient and and in unit/floor time directly  related to the patient's care.  More than 50% of the time was spent in counseling patient re:  post-prostatectomy pathway and plan.     CURES Database reviewed.

## 2017-04-11 NOTE — Op Note (Signed)
DATE OF SERVICE:  04/10/2017    PREOPERATIVE DIAGNOSIS:  Bladder diverticula.    POSTOPERATIVE DIAGNOSIS:  Bladder diverticula, benign prostatic  hyperplasia.     PROCEDURE PERFORMED:    1.  Cystoscopy.  2.  Robotic-assisted laparoscopic diverticulectomy.    SURGEON/STAFF:  Cira Servant, MD, PHD    ASSISTANTS:    1.  Delanna Ahmadi, M.D.  2.  Tyron Russell, M.D.    ANESTHESIA:  General.    INDICATIONS FOR SURGERY:  Pedro Shannon is a 67 year old gentleman known  to have bladder diverticula.  He has been having problems with lower  urinary tract symptoms and urinary tract infections.  A decision was  made together with the patient to proceed with diverticulectomy.  He  understands that eventually he will need some outlet reduction  surgery to avoid recurrence.  Informed consent was obtained.     OPERATIVE FINDINGS:    1.  Two medium-sized diverticula.  2.  Watertight closure.  3.  BPH with difficult catheterization requiring cystoscopic placement  of catheter.     DRAINS:  20-French Foley catheter.    SPECIMENS:  Diverticula x2.    ESTIMATED BLOOD LOSS:  10 cc.    TRANSFUSIONS:  None.    FLUIDS:  1.9 L.    COMPLICATIONS:  None.    PROCEDURE IN DETAIL:  The patient was brought to the operating room  and underwent a general anesthetic.  IV antibiotics were  administered.  Sequential compression devices were applied to his  legs bilaterally.  He was placed in the supine position split legged  and secured to the bed in usual fashion.  The patient was placed in  steep Trendelenburg.  The pneumoperitoneum was easily established at  the level of the umbilicus, and a series of robotic and laparoscopic  trocars were placed under direct visualization.  The robot was  docked.  Cystoscopy was performed noting 1 very narrow-mouthed  medium-sized diverticulum and another diverticulum, which was wider  mouthed, just above that original diverticulum.  Robotically with cystoscope  in place, both diverticula were excised without  difficulty.  They  were oversewn with 3-0 V-Loc suture.  These were seen to be  watertight.     Attempts at placement of a Foley catheter were not successful  initially.  Eventually, a cystoscope was placed, an extra stiff  guidewire, and a coude-tipped catheter, which was modified to be a  Council tip, the catheter was placed.  Of note, there were several  false passages in the prostatic urethra, which were problematic  during attempted catheter placement.     Sponge and instrument counts were correct.  The patient tolerated the  procedure well.  The patient was transferred to the recovery room in  good condition.       Job #:  [id number]

## 2017-04-11 NOTE — Progress Notes (Shared)
Urology Progress Note    Patient Name: Pedro Pollio HarrisMRN: [id number] Room#: [id number]    Hospital Day:   1 day - Admitted on: 04/10/2017    Admitted: 04/10/2017 10:52 AM    Identification:   Yvette Roark is a 67 year old male with Hx of BPH w/ LUTS and bladder diverticuli s/p cystoscopy and robotic assisted laparoscopic bladder diverticulectomy on 04/10/2017.  1 Day Post-Op    Subjective/Events:   - No acute overnight events  - Required labetalol 6m for BP 185/110 at 20:00  - Pain PRNs: Fentanyl IV 25 mcg x2 & 528m x1, Dilaudid 0.5 mg IV x1, Morphine IM 2 mg x2 & 53m553m1  - Ambulating  - Feels distended, has not passed flatus  - Pain well-controlled    Scheduled Meds   docusate sodium  100 mg BID    sodium PHOSphate IV  10 mEq Once     PRN Meds   acetaminophen  650 mg Q6H PRN    belladonna-opium  1 suppository Q12H PRN    HYDROcodone-acetaminophen  1 tablet Q4H PRN    HYDROcodone-acetaminophen  2 tablet Q4H PRN    menthol  1 lozenge Q2H PRN    morphine  2 mg Q2H PRN    morphine  4 mg Q2H PRN    ondansetron  4 mg Q6H PRN    phenol  3 spray Q2H PRN    simethicone  80 mg Q6H PRN     IV Meds   dextrose 5%-sodium chloride 0.45%-potassium chloride 20 mEq/L Stopped (04/11/17 0636)       Objective:  Vital Signs:   Latest Entry  Range (last 24 hours)    Temperature: 99.4 F (37.4 C)  Temp  Avg: 98 F (36.7 C)  Min: 97 F (36.1 C)  Max: 99.4 F (37.4 C)    Blood pressure (BP): 134/88  BP  Min: 134/88  Max: 185/110    Heart Rate: 92  Pulse  Avg: 72.5  Min: 62  Max: 92    Respirations: 18  Resp  Avg: 15  Min: 10  Max: 21    SpO2: 96 %  SpO2  Avg: 95.1 %  Min: 92 %  Max: 98 %       No Data Recorded   - Previously 93-98% O2 on 2-3L NC    Intake/Output (Last day):  02/13 0600 - 02/14 0559  In: 1900 [I.V.:1900]  Out: 425 [Urine:425]      Physical Exam:  General: WDWN. NAD. A&0x4  Pulm:  Non-labored on room air  Abdomen: Soft. Mildly tender, distended, tympanitic to percussion. No rebound, guarding.     GU: Fc in place, draining clear urine  Extremities: No c/c/e.    Laboratory data:   Recent Labs     04/11/17  0519   WBC 11.5*   RBC 4.74   HGB 15.5   HCT 46.3   MCV 97.7*   MCHC 33.5   RDW 13.2   PLT 232   MPV 10.5     Recent Labs     04/11/17  0519   NA 138   K 4.9   CL 103   BICARB 24   BUN 18   CREAT 1.43*   GLU 155*   [place] 8.4*   MG 1.9   PHOS 2.1*     No results for input(s): TBILI, TP, AST, ALK, ALT in the last 72 hours.    Invalid input(s): ALKPHOS  No results for  input(s): PT, PTT, INR in the last 72 hours.    No results found for: COLORUA, APPEARUA, GLUCOSEUA, BILIUA, KETONEUA, SGUA, BLOODUA, PHUA, PROTEINUA, UROBILUA, NITRITEUA, LEUKESTUA, WBCUA, RBCUA, EPITHCELLSUA, HYALINEUA, CRYSTALSUA, COMMENTSUA    Microbiology:  None    Imaging:  None    Assessment and Plan:  Rashawn Tayne Thieme is a 67 year old male with Hx of BPH w/ LUTS and bladder diverticuli s/p cystoscopy and robotic assisted laparoscopic bladder diverticulectomy on 04/10/2017.  1 Day Post-Op    Neuro: transition to PO pain meds  CV: Hypertensive to SBP 160s-180s may post-op pain related, continue labetalol PRN SBP > 180 & CTM BPs  Resp: saturating well on RA, encourage IS   GI/FEN: clear liquid diet this AM, advance to regular diet this PM if clears tolerated, continue bowel regimen, replete lytes prn. D/c IV fluids.  GU: Fc draining clear urine, continue Fc x 1 week  ID/Heme: afebrile, peri-op abx complete, H/H stable  PPx: SCDs & ambulation for VTE prophylaxis, IS, OOB   Dispo: pending milestones, anticipate discharge today    Patient seen with chief resident, and discussed w/ attending, Dr. Kader.  Written in collaboration with Thejas Kamath MS3.      Urology  [facility]

## 2017-04-11 NOTE — Plan of Care (Signed)
Problem: Promotion of Health and Safety  Goal: Promotion of Health and Safety  The patient remains safe, receives appropriate treatment and achieves optimal outcomes (physically, psychosocially, and spiritually) within the limitations of the disease process by discharge.    Information below is the current care plan.  Outcome: Progressing   04/10/17 2030 04/11/17 0301   Patient /Family stated Goal   Patient /Family stated Goal "pain controlled" --    Adult/Peds Plan of Care   Guidelines --  Inpatient Nursing Guidelines   Individualized Interventions/Recommendations #1 --  Multimodal pain regimen    Individualized Interventions/Recommendations #2 (if applicable) --  Monitor UOP from foley    Individualized Interventions/Recommendations #3 (if applicable) --  Pt. ambulating independently    Individualized Interventions/Recommendations #4 (if applicable) --  Pt. on IVF    Outcome Evaluation (rationale for progressing/not progressing) every shift --  Pain controlled, UOP reported to MD, will continue to monitor

## 2017-04-11 NOTE — Interdisciplinary (Signed)
04/11/17 1309   Provider Notification   Provider Notified Physician   Provider Name 1st page   Method of Communication Text Page   Reason for Communication pt stated Norco is ineffective for pain, asking for something stronger. Also asking for laxative.

## 2017-04-11 NOTE — Interdisciplinary (Signed)
Pt is stable for discharge. Pt ambulated >434ft independently, passed gas, tolerated reg diet, and report relief from pain medication. Educated pt on dc instructions and post op care printed on AVS. Pt verbalized understanding, all questions answered. Leg bag teaching provided to pt and he reported he used to have leg bag. Supplies provided to pt.

## 2017-04-11 NOTE — Discharge Instructions (Signed)
Diagnosis and Reason for Admission    You were admitted to the hospital for the following reason(s):  Bladder diverticula.    Your full diagnosis list is located on this After Visit Summary in the Hospital Problems section.    What [facility] Stay    The main tests and treatments done for you during this hospitalization were:    Robot assisted laparoscopic diverticulectomy.     The following evaluation is still important to complete after discharge from the hospital:  - Follow up with Urology Nurse for catheter removal on April 18, 2107.. Call the Urology Office to confirm appointment: [phone] or [phone].  - Follow up with MD Kader on April 23, 2017, as scheduled.    Instructions for After Discharge    Start antibiotic on the morning of your catheter removal appointment next week.  The second dose will be that evening.  This is a prophylactic for your catheter removal that day.    Your diet at home should be a regular diet.  Keep your meals light and avoid anything that you know may be gas-forming (ie. beans, broccoli, cabbage, etc) until your first bowel movement in a few days.  After your first bowel movement you may eat as you normally were before surgery.    Your activity level at home should be:  limited activity for 6 weeks, as specified below.  Sitting in a semi-reclined / slouched position and on a soft pillow will be more comfortable than sitting upright on a firm surface.    Specific activity restrictions:    No lifting over 5 lbs for 6 weeks.  No driving while on narcotic medications.  Do not drive with catheter in place.    Wound or tube care instructions:  You may shower starting tomorrow, as instructed - allow the soapy water to flow over the surgical sites and do not scrub them; pat the sites dry; there is no need for any special ointments or creams over these sites.  No baths, swimming, pools, ocean, or submersion for at least 3 weeks / when incisions fully  healed.    Your medication list is located on this After Visit Summary in the Current Discharge Medication List section.  Your nurse will review this information with you before you leave the hospital.    It is very important for you to keep a current medication list with you in order to assist your doctors with your medical care.  Bring this After Visit Summary with you to your follow up appointments.    Reasons to Contact a Doctor Urgently    Call 911 or return to the hospital immediately:  Shortness of breath / difficulty breathing.  Chest pain / palpitations.    You should contact either your primary care physician or your hospital surgeon for any of the following reasons:   Increased swelling of the legs, ankles, or feet.  Weight gain of 3 or more pounds in 3 days or less.  Severe chest pain or new back pain after your procedure.  Bleeding, worsening swelling, bruising, or increased pain at the surgical site.  Fever or increasing redness or drainage at the surgical site.  Increasing fatigue or decreasing ability to do usual daily activities.  Sudden new shortness of breath.    If you have any questions about your hospital care, your medications, or if you have new or concerning symptoms soon after going home from the hospital you may contact our  office.  Business Hours (Monday-Friday; 08:00AM - 4:30PM; Non-Urgent):  - Urology Office phone at [phone] or [phone].  - MD Hessie Knows nurse is RN Lillette Boxer.  RN Taylor's office # is [phone].    After-Hours, Weekends/Holidays, and Urgent:  Have the "On-Call Urologist" paged at [phone].  (If you do not receive a return call in 5-10 minutes and it is an urgent matter, then call back and ask to have the on-call urologist paged.)  Once you are able to see your primary care provider (PCP), your PCP will then be responsible for further medication refills, or appointment referrals.    What Needs to Happen Next After Discharge --  Appointments and Follow Up    Any appointments already scheduled at [facility] clinics will be listed in the Future Appointments section at the top of this After Visit Summary.  Any appointments that have been requested, but have not yet been scheduled, will be listed below that under Post Discharge Referrals.           DISCHARGE INSTRUCTIONS  DIVERTICULECTOMY    CATHETER REMOVAL:   You may contact our office to confirm your next appointment to see me to have your catheter removed.   Please call # [phone] or [phone].      CATHETER CARE  While at home we would like you to have your Foley catheter connected to the large bedtime drainage bag most of the time.  The leg bag should only be used occasionally if you plan to go out of the house. Drink 4-6 glasses of water in a 24-hour period.  This helps keep your urine clear.  It is normal for your urine to be pink-tinged during the next 2 weeks, especially with walking and bowel movements.  Increasing fluids will usually make the urine clear again.  If your catheter is not draining, make sure that it is not kinked. If there are no kinks and the urine is still not flowing, please call me immediately.  You may notice a pink colored mucus type discharge at the tip of your penis.  This is normal.  You can use a warm soapy washcloth to clean the area 3 times a day and then apply antibiotic ointment.     Leaking around the catheter - When you are up walking around, you may have leakage around the catheter.  This can usually be managed through the use of pads or other absorbent materials.  If your catheter stops draining completely, lie down flat and drink a lot of water.  If, after 1 hour there is still no urine coming through the catheter, it is possible that your catheter has become blocked or dislodged.  At that point call me (see below). Also call me if you have leakage around the catheter with abdominal discomfort and/or a strong urge to urinate.    ****VERY  IMPORTANT: IF YOUR CATHETER FALLS OUT WHILE YOU ARE AT HOME, call the [place] operator at [phone] and ask to speak to [place].  The operator will put your call through.  Please be patient, these pages sometimes take as long as five to ten minutes for the physician to answer.    DIET  You may eat and drink whatever you wish. You may wish to increase your fresh fruit and vegetable intake to keep your stools soft. If you do become constipated take mineral oil and milk of magnesia. Alcohol consumption in moderation is  acceptable.  Do not have an enema!    WALKING AND MOVEMENT  DONTS:  After you are discharged from the hospital, you must avoid heavy lifting (>5-10 lbs) and vigorous exercise (calisthenics, golf, tennis, jogging, weight lifting) for a total of 6 weeks from the day of surgery.     DOS:  Please sit in a semi-recumbent position (in a reclining chair, on a sofa, or in a comfortable chair with a footstool) the first 2 weeks you are home.  This accomplishes 2 goals: 1) it elevates your legs, thereby improving drainage from the veins in your legs which will reduce the possibility of clot formation (see below); and 2) it avoids placing weight on the area of your surgery in the perineum (the space between the scrotum and the rectum).     You may ride in a car at any time.  Please do not drive if you are taking any kind of pain medications that may alter your ability to drive.  You may climb stairs any time. Once your catheter has been removed, you may drive a car. You may walk briskly as tolerated. You may lift anything lighter than two phonebooks.     INCISIONS  You may shower the day after you go home.  Do not take tub bath, use a hot tub, swim in the ocean, or swim in a pool for 3 weeks.  Some patients develop drainage from one or more incisions after they go home.  This can either be clear fluid or a mixture of blood and thicker fluid. Drainage usually can be treated simply.  Obtain  some hydrogen peroxide and Q-tips: soak the Q-tip in the hydrogen peroxide and gently clean the area.     I suggest that you shower in the morning, washing this area lightly. However, if you develop red skin around the area, increasing pain, or fevers, call me immediately.    PROBLEMS    Clots in the legs - During the first 4-6 weeks after surgery, the major complication that occurs in 1-2% of men is a clot in a vein deep in your leg (deep venous thrombosis).  This can produce pain in your calf or swelling in your ankle or leg.  These clots may break loose and then travel to the lung, producing a life-threatening condition known as pulmonary embolus. A pulmonary embolus also can occur without any pain or swelling in your leg; the symptoms are chest pain (especially when you take a deep breath), shortness of breath, the sudden onset of weakness or fainting, and /or coughing up blood.      If you develop any of these symptoms or pain/swelling in your leg, call me immediately.  Also, you should immediately call your physician and go to an emergency room and state that you need to be evaluated for deep venous thrombosis or pulmonary embolism.  If the diagnosis is made early, treatment with blood thinners is easy and effective.    Urinary Tract Infection - Urinary tract infections may sometimes occur. These are usually not serious. They may manifest in several different ways. Before the catheter is removed, the urine may become permanently cloudy (see below), and/or you may develop some painful white drainage around the catheter. After the catheter is removed, you may have burning with urination, blood in the urine, increased frequency of urination, lower abdominal pain, and strong urges to urinate. Please call me to discuss your symptons.     Urinary sediment - It is not  uncommon for there to be some sediment in the urine. Old blood clots may appear as small, dark particles.  By increasing your fluid intake, these will  usually disappear.  Also, the urine may sometimes change color depending on how much fluid you are drinking and what you are eating. Do not be concerned--these changes are normal. However, if the urine is persistently cloudy, this suggests that you may have an infection (see above).    Pain - Some abdominal pain may occur for the first 2-4 weeks after the surgery. The pain is from irritation of the abdominal muscles during surgery.  It will resolve on its own.  Try to avoid activities that bring it on. Use your pain medications as needed.     COMMUNICATION  If you have any problems please feel free to call the Urology Nurse during regular business.  Please note Urology Nursing contact information below:    - MD A. [place] nurse is RN Lillette Boxer.  RN Taylor's office # is [phone].    Please note that if you need to speak to the Urology Administrative Office,   please call # [phone].    If you should have a problem during the night or on a weekend call the [place] operator at # [phone] and ask to speak to [place].  The operator will put your call through.  Please be patient, these pages sometimes take as long as five to ten minutes for the physician to answer.  If you do not receive a return call in 5-10 minutes and it is an urgent matter, then call back and ask to have the on-call urologist paged.

## 2017-04-11 NOTE — Interdisciplinary (Signed)
04/11/17 1319   Initial Assessment   CM Initial Assessment Completed   Patient Information   Where was the patient admitted from? Home   Prior to Level of Function Ambulatory/Independent with ADL's   Assistive Device Not applicable   Prior Community Resources None   Primary Caretaker(s) Spouse/Partner   Primary Contact Name, Number and Relationship Raelyn Ensign, spouse, [phone]   Permission to [place] Unemployed   Financial Resources Medicare   Discharge [place] Other   Available [place] / significant other   Type of Residence (Unable to assess, patient taken to ultrasound.)   Barriers to Discharge Awaiting clinical improvement;Awaiting diagnostic study   Patient/Family Engaged in Discharge Planning Yes   Patient Has Decision Making Capacity Yes   Family/Caregiver's Assessed for Readiness, willingness, and ability to provide or support self-management activities;Readiness to provide care to the patient   Public Health Clearance Needed No   Social Worker Consult   Do you need to see a Education officer, museum?  No   Readmission Risk Assessment   Readmission Within 30 Days of Discharge No   Admission Was Unplanned   Patient Explanation  Got sicker   Recent Hospitalizations (Within Last 6 Months) No   High Risk For Readmission No   MOON   MOON Provided to Patient Not Applicable     CM met with patient at bedside. Address of FS confirmed. Patient to discharge to address with assistance from spouse    PCP: Dr. Cleopatra Cedar    RX: [facility] [address] [place]     DCP: Home with spouse to assist.  Patient request to take Lyft home today if discharged due to spouse unable to drive in rain.  CM to follow for discharge planning needs.

## 2017-04-12 ENCOUNTER — Telehealth (HOSPITAL_BASED_OUTPATIENT_CLINIC_OR_DEPARTMENT_OTHER): Payer: Self-pay | Admitting: Urology

## 2017-04-12 ENCOUNTER — Encounter: Payer: Self-pay | Admitting: Hospital

## 2017-04-12 ENCOUNTER — Telehealth (HOSPITAL_BASED_OUTPATIENT_CLINIC_OR_DEPARTMENT_OTHER): Payer: Self-pay

## 2017-04-12 ENCOUNTER — Other Ambulatory Visit (HOSPITAL_BASED_OUTPATIENT_CLINIC_OR_DEPARTMENT_OTHER): Payer: Self-pay | Admitting: Urology

## 2017-04-12 DIAGNOSIS — N323 Diverticulum of bladder: Secondary | ICD-10-CM

## 2017-04-12 LAB — MRSA SURVEILLANCE CULTURE

## 2017-04-12 NOTE — Telephone Encounter (Signed)
Sharyn Lull RN called reporting that pt needs more norco medication. He was only given 8 tablets and would like more for pain control over the weekend. He also reported bloating 2 days post op, but Sharyn Lull RN reassured him that its normal and should resolve after BM or passing gas.     Routing to NP [facility] for refill request for norco     Future Appointments   Date Time Provider [place]   04/17/2017  9:30 AM [facility]   04/23/2017  3:45 PM Kader, [place]

## 2017-04-12 NOTE — Telephone Encounter (Signed)
-----   Message from Edrick Kins, NP sent at 04/12/2017  7:56 AM PST -----  Salley Scarlet Morene Antu,    Please confirm when complete.    Thank you,    Neremiah      ----- Message -----  From: Edrick Kins, NP  Sent: 04/11/2017   8:30 AM  To: Cira Servant, Lucianne Muss, #    Hi Morene Antu,    Please call and schedule Mr Fullen for a catheter removal Urology Nurse visit on Wednesday, 2/20.  I'll place the order.    He had sx yesterday w/ Dr Nani Skillern and is being dc'd home today.  He is to also keep his currently scheduled post-op visit for later this month.    Thank you,    Peri Maris

## 2017-04-12 NOTE — Telephone Encounter (Signed)
Per post op RN Sharyn Lull, pt ok w/ 02/20 nurse visit appt at 9:30 AM.

## 2017-04-12 NOTE — Telephone Encounter (Signed)
Post Op RN Sharyn Lull called to state pt requesting for afternoon appt on 02/20, I advised Sharyn Lull since it is a nurse visit, I am unable to schedule afternoon appt. I will call pt to inform him.

## 2017-04-12 NOTE — Anesthesia Postprocedure Evaluation (Signed)
Anesthesia Transfer of Care Note    Patient: Aboubacar Matsuo    Procedures performed: Procedure(s):  DA [place] XI ROBOTIC ASSISTED Diverticulectomy and cystoscopy    Vital signs: stable                Anesthesia Post Note    Patient: [name]    Procedure(s) Performed: Procedure(s):  DA [place] XI ROBOTIC ASSISTED Diverticulectomy and cystoscopy      Final anesthesia type: General    Patient location: PACU    Post anesthesia pain: adequate analgesia    Mental status: awake, alert  and oriented    Airway Patent: Yes    Last Vitals:   Vitals:    04/11/17 1306   BP: 121/80   Pulse: 95   Resp: 20   Temp: 36.7 C   SpO2: 98%       Post vital signs: stable    Hydration: adequate    N/V:no    Anesthetic complications: no    Plan of care per primary team.

## 2017-04-12 NOTE — Telephone Encounter (Signed)
RN call to pt regarding NP Castano's message:  Georgina Pillion,     He was just discharged yesterday and that amount is what we are authorized to provide on hospital discharge. Anything greater needs to be approved by the surgeon.      He can take acetaminophen, OTC ibuprofen at 200 mg with food, OTC simethicone.     Thanks (Routing comment)     No answer, left a detailed voicemail    Future Appointments   Date Time Provider [place]   04/17/2017  9:30 AM [facility]   04/23/2017  3:45 PM Kader, [place]

## 2017-04-12 NOTE — Telephone Encounter (Signed)
Transitional Telephonic Nurse ( TTN ) Post- Discharge Follow-Up Call    Per discharge summary pt s/p bladder diverticulum; s/p robotic assist diverticulectomy and cystoscopy  Triggered Alert:  Medication Questions/ Symptoms    TN called and spoke with pt; he endorses post operative abdominal pain level 6-7 on numeric scale, not well controlled at home; has abdominal bloating, has not had bowel movement yet; TN recommended ambulation, increase fluids, attempt milk of magnesia, mineral oil to assist with BM; TN reinforced some post surgical pain is to be expected, some bloating may occur     Pt states he will need refill of the HYDROcodone-acetaminophen 5-325 MG  tablet  Commonly known as: NORCO  Take 0.5 tablets by mouth every 8 hours as  needed for Severe Pain (Pain Score 7-10) or  Breakthrough Pain; he is taking differently then prescribed; taking tylenol every 4 hours as well as one tablet of Norco every 4 hours; he will run out of Norco prior to his 04/17/17 appt; TN reinforced do not exceed greater than 3,00mg  tylenol daily      His pharmacy of choice is:    [facility] [place] [facility] [address]  [address] [place] [address]  Phone: [phone] Fax: [phone]  Symptoms  Discuss- pt reports abdominal bloating , not relieved with ambulation, has yet to have BM, taking in fluids, states hard to eat based on bloating; sates he is not passing gas;  foley with clear yellow urine draining     we reviewed AVS including medications, diet, activity level, reasons to call the clinic, and ED return precautoins, pt verbalized understanding. TN will route note to clinic for symptom, medication refill  Pt would like to change his appt on 04/17/17;     TN called the clinic re medication refill and to let scheduler know pt prefers different time; she will call pt directly; she advised TN to route note to D Kader and Lillette Boxer RN        Reviewed YES NO N/A Comments      Symptoms  x   Post operative pain, bloating, no bowel movement, not passing gas   Medications (New medications/Home Medications/Pharmacy x   States he will need refill on Norco   Feeding    NA    Follow up/Specialist x   Has appt 04/17/17   Discharge Instructions (diet, activity, wound care, special instructions) x      Transportation Concerns x   none   911/ED Precautions x      External Agency HH/DME x  NA    PCP (Does the patient have a provider) x      Education (What education did you provide the patient) x   Reinforced use of laxative for BN, when to call clinic, not to exceed tylenol dose greater then 3,000mg  daily; pain controlk   Satisfaction   NA    Consult with TTN LCSW   NA          Dana Allan, RN MSN, [facility]  Telephonic Nurse    [place]  Transitional Telephonic Nursing  [place] [address]  T: [phone]  C [phone]  [email or web address]  [email or web address]

## 2017-04-12 NOTE — Telephone Encounter (Signed)
Left message for pt at [phone] - advised I received a msg to schedule a nurse visit on 02/20. I have scheduled him on 02/20 at 9:30 AM at [place]. Request call back to confirm at [phone].    I also left msg with Riva at secondary number [phone], requesting for pt to call me back regarding an appt.    I have also sent msg a Mychart msg with appointment information

## 2017-04-12 NOTE — Telephone Encounter (Signed)
Left msg for pt at primary number. Advised I received a call from post op nurse asking to reschedule 02/20 appt to the afternoon per his request. I advised nurse visit are only in the morning and I scheduled him latest available on 02/2 which was 9:30 AM. He can call me back to reschedule on a different day, but will still be in the morning.

## 2017-04-13 ENCOUNTER — Emergency Department (HOSPITAL_BASED_OUTPATIENT_CLINIC_OR_DEPARTMENT_OTHER): Payer: Medicare Other

## 2017-04-13 ENCOUNTER — Encounter (HOSPITAL_COMMUNITY): Admission: EM | Disposition: A | Discharge status: Home Health | Payer: Self-pay | Attending: Urology

## 2017-04-13 ENCOUNTER — Observation Stay (HOSPITAL_COMMUNITY)
Admission: EM | Admit: 2017-04-13 | Discharge: 2017-04-14 | Disposition: A | Discharge status: Home Health | Payer: Medicare Other | Attending: Urology | Admitting: Urology

## 2017-04-13 ENCOUNTER — Inpatient Hospital Stay (HOSPITAL_BASED_OUTPATIENT_CLINIC_OR_DEPARTMENT_OTHER): Payer: Medicare Other

## 2017-04-13 DIAGNOSIS — J9811 Atelectasis: Secondary | ICD-10-CM

## 2017-04-13 DIAGNOSIS — K5730 Diverticulosis of large intestine without perforation or abscess without bleeding: Secondary | ICD-10-CM

## 2017-04-13 DIAGNOSIS — Z792 Long term (current) use of antibiotics: Secondary | ICD-10-CM

## 2017-04-13 DIAGNOSIS — Z808 Family history of malignant neoplasm of other organs or systems: Secondary | ICD-10-CM

## 2017-04-13 DIAGNOSIS — Z800 Family history of malignant neoplasm of digestive organs: Secondary | ICD-10-CM

## 2017-04-13 DIAGNOSIS — N3289 Other specified disorders of bladder: Secondary | ICD-10-CM

## 2017-04-13 DIAGNOSIS — N179 Acute kidney failure, unspecified: Secondary | ICD-10-CM

## 2017-04-13 DIAGNOSIS — Z9889 Other specified postprocedural states: Secondary | ICD-10-CM

## 2017-04-13 DIAGNOSIS — R339 Retention of urine, unspecified: Secondary | ICD-10-CM

## 2017-04-13 DIAGNOSIS — S3730XA Unspecified injury of urethra, initial encounter: Secondary | ICD-10-CM

## 2017-04-13 DIAGNOSIS — T797XXA Traumatic subcutaneous emphysema, initial encounter: Secondary | ICD-10-CM

## 2017-04-13 DIAGNOSIS — N9971 Accidental puncture and laceration of a genitourinary system organ or structure during a genitourinary system procedure: Secondary | ICD-10-CM | POA: Diagnosis present

## 2017-04-13 DIAGNOSIS — J439 Emphysema, unspecified: Secondary | ICD-10-CM

## 2017-04-13 DIAGNOSIS — R1084 Generalized abdominal pain: Secondary | ICD-10-CM

## 2017-04-13 DIAGNOSIS — Z888 Allergy status to other drugs, medicaments and biological substances status: Secondary | ICD-10-CM

## 2017-04-13 DIAGNOSIS — Z8546 Personal history of malignant neoplasm of prostate: Secondary | ICD-10-CM

## 2017-04-13 DIAGNOSIS — R338 Other retention of urine: Secondary | ICD-10-CM | POA: Diagnosis present

## 2017-04-13 DIAGNOSIS — Z79891 Long term (current) use of opiate analgesic: Secondary | ICD-10-CM

## 2017-04-13 DIAGNOSIS — K567 Ileus, unspecified: Secondary | ICD-10-CM | POA: Diagnosis present

## 2017-04-13 DIAGNOSIS — T83021A Displacement of indwelling urethral catheter, initial encounter: Principal | ICD-10-CM | POA: Diagnosis present

## 2017-04-13 DIAGNOSIS — S3720XA Unspecified injury of bladder, initial encounter: Secondary | ICD-10-CM

## 2017-04-13 DIAGNOSIS — Z801 Family history of malignant neoplasm of trachea, bronchus and lung: Secondary | ICD-10-CM

## 2017-04-13 DIAGNOSIS — Y846 Urinary catheterization as the cause of abnormal reaction of the patient, or of later complication, without mention of misadventure at the time of the procedure: Secondary | ICD-10-CM | POA: Diagnosis present

## 2017-04-13 DIAGNOSIS — T888XXA Other specified complications of surgical and medical care, not elsewhere classified, initial encounter: Secondary | ICD-10-CM

## 2017-04-13 DIAGNOSIS — Z79899 Other long term (current) drug therapy: Secondary | ICD-10-CM

## 2017-04-13 DIAGNOSIS — N323 Diverticulum of bladder: Secondary | ICD-10-CM

## 2017-04-13 DIAGNOSIS — R140 Abdominal distension (gaseous): Secondary | ICD-10-CM

## 2017-04-13 DIAGNOSIS — R066 Hiccough: Secondary | ICD-10-CM | POA: Diagnosis present

## 2017-04-13 DIAGNOSIS — N401 Enlarged prostate with lower urinary tract symptoms: Secondary | ICD-10-CM | POA: Diagnosis present

## 2017-04-13 DIAGNOSIS — R109 Unspecified abdominal pain: Secondary | ICD-10-CM

## 2017-04-13 LAB — URINALYSIS
Bilirubin: NEGATIVE
Glucose: NEGATIVE
Nitrite: NEGATIVE
RBC: >50 — AB (ref 0–?)
Specific Gravity: 1.028 (ref 1.002–1.030)
Urobilinogen: NEGATIVE
pH: 5 (ref 5.0–8.0)

## 2017-04-13 LAB — APTT, BLOOD: PTT: 26 s (ref 25–34)

## 2017-04-13 LAB — COMPREHENSIVE METABOLIC PANEL, BLOOD
ALT (SGPT): 18 U/L (ref 0–41)
AST (SGOT): 29 U/L (ref 0–40)
Albumin: 3.5 g/dL (ref 3.5–5.2)
Alkaline Phos: 58 U/L (ref 40–129)
Anion Gap: 18 mmol/L — ABNORMAL HIGH (ref 7–15)
BUN: 53 mg/dL — ABNORMAL HIGH (ref 8–23)
Bicarbonate: 19 mmol/L — ABNORMAL LOW (ref 22–29)
Bilirubin, Tot: 0.71 mg/dL (ref <1.2)
Calcium: 8.6 mg/dL (ref 8.5–10.6)
Chloride: 98 mmol/L (ref 98–107)
Creatinine: 5.08 mg/dL — ABNORMAL HIGH (ref 0.67–1.17)
GFR: 11 mL/min
Glucose: 137 mg/dL — ABNORMAL HIGH (ref 70–99)
Potassium: 4.8 mmol/L (ref 3.5–5.1)
Sodium: 135 mmol/L — ABNORMAL LOW (ref 136–145)
Total Protein: 6.4 g/dL (ref 6.0–8.0)

## 2017-04-13 LAB — PROTHROMBIN TIME, BLOOD
INR: 1.1
PT,Patient: 11.6 s (ref 9.7–12.5)

## 2017-04-13 LAB — CBC WITH DIFF, BLOOD
ANC-Automated: 7 10*3/uL (ref 1.6–7.0)
Abs Basophils: 0 10*3/uL (ref <0.1)
Abs Eosinophils: 0.2 10*3/uL (ref 0.1–0.5)
Abs Lymphs: 0.6 10*3/uL — ABNORMAL LOW (ref 0.8–3.1)
Abs Monos: 0.9 10*3/uL — ABNORMAL HIGH (ref 0.2–0.8)
Basophils: 0 %
Eosinophils: 2 %
Hct: 47.2 % (ref 40.0–50.0)
Hgb: 15.9 gm/dL (ref 13.7–17.5)
Lymphocytes: 7 %
MCH: 32.9 pg — ABNORMAL HIGH (ref 26.0–32.0)
MCHC: 33.7 g/dL (ref 32.0–36.0)
MCV: 97.5 um3 — ABNORMAL HIGH (ref 79.0–95.0)
MPV: 10.5 fL (ref 9.4–12.4)
Monocytes: 11 %
Plt Count: 231 10*3/uL (ref 140–370)
RBC: 4.84 10*6/uL (ref 4.60–6.10)
RDW: 13.2 % (ref 12.0–14.0)
Segs: 81 %
WBC: 8.7 10*3/uL (ref 4.0–10.0)

## 2017-04-13 LAB — LIPASE, BLOOD: Lipase: 22 U/L (ref 13–60)

## 2017-04-13 LAB — BASIC METABOLIC PANEL, BLOOD
Anion Gap: 15 mmol/L (ref 7–15)
BUN: 56 mg/dL — ABNORMAL HIGH (ref 8–23)
Bicarbonate: 22 mmol/L (ref 22–29)
Calcium: 8.5 mg/dL (ref 8.5–10.6)
Chloride: 100 mmol/L (ref 98–107)
Creatinine: 4.42 mg/dL — ABNORMAL HIGH (ref 0.67–1.17)
GFR: 13 mL/min
Glucose: 139 mg/dL — ABNORMAL HIGH (ref 70–99)
Potassium: 4.2 mmol/L (ref 3.5–5.1)
Sodium: 137 mmol/L (ref 136–145)

## 2017-04-13 SURGERY — IR CT PELVIC DRAIN
Site: Pelvis

## 2017-04-13 MED ORDER — ALPRAZOLAM 0.5 MG OR TABS
0.5000 mg | ORAL_TABLET | Freq: Once | ORAL | Status: AC
Start: 2017-04-13 — End: 2017-04-13
  Administered 2017-04-13: 0.5 mg via ORAL
  Filled 2017-04-13: qty 1

## 2017-04-13 MED ORDER — SODIUM CHLORIDE 0.9 % IV SOLN
INTRAVENOUS | Status: DC
Start: 2017-04-13 — End: 2017-04-14
  Administered 2017-04-14: 02:00:00 via INTRAVENOUS

## 2017-04-13 MED ORDER — ACETAMINOPHEN 325 MG PO TABS
650.0000 mg | ORAL_TABLET | ORAL | Status: DC | PRN
Start: 2017-04-13 — End: 2017-04-14

## 2017-04-13 MED ORDER — ACETAMINOPHEN 650 MG RE SUPP
650.0000 mg | RECTAL | Status: DC | PRN
Start: 2017-04-13 — End: 2017-04-14

## 2017-04-13 MED ORDER — MIDAZOLAM HCL 2 MG/2ML IJ SOLN
INTRAMUSCULAR | Status: DC | PRN
Start: 2017-04-13 — End: 2017-04-13
  Administered 2017-04-13: 2 mg via INTRAVENOUS

## 2017-04-13 MED ORDER — DOCUSATE SODIUM 250 MG OR CAPS
250.0000 mg | ORAL_CAPSULE | Freq: Every evening | ORAL | Status: DC
Start: 2017-04-13 — End: 2017-04-14
  Administered 2017-04-13: 250 mg via ORAL
  Filled 2017-04-13: qty 1

## 2017-04-13 MED ORDER — SODIUM CHLORIDE 0.9 % IJ SOLN (CUSTOM)
3.0000 mL | Freq: Three times a day (TID) | INTRAMUSCULAR | Status: DC
Start: 2017-04-13 — End: 2017-04-14

## 2017-04-13 MED ORDER — HYDROMORPHONE HCL 1 MG/ML IJ SOLN
1.0000 mg | Freq: Once | INTRAMUSCULAR | Status: AC
Start: 2017-04-13 — End: 2017-04-13
  Administered 2017-04-13: 1 mg via INTRAVENOUS
  Filled 2017-04-13: qty 1

## 2017-04-13 MED ORDER — NALOXONE HCL 0.4 MG/ML IJ SOLN
0.1000 mg | INTRAMUSCULAR | Status: DC | PRN
Start: 2017-04-13 — End: 2017-04-14

## 2017-04-13 MED ORDER — BISACODYL 10 MG RE SUPP
10.0000 mg | Freq: Once | RECTAL | Status: DC
Start: 2017-04-13 — End: 2017-04-14

## 2017-04-13 MED ORDER — ACETAMINOPHEN 160 MG/5ML OR SOLN
650.0000 mg | ORAL | Status: DC | PRN
Start: 2017-04-13 — End: 2017-04-14

## 2017-04-13 MED ORDER — POLYETHYLENE GLYCOL 3350 OR PACK
17.0000 g | PACK | Freq: Once | ORAL | Status: AC
Start: 2017-04-13 — End: 2017-04-13
  Administered 2017-04-13: 17 g via ORAL
  Filled 2017-04-13: qty 1

## 2017-04-13 MED ORDER — ONDANSETRON HCL 4 MG/2ML IV SOLN
4.0000 mg | Freq: Four times a day (QID) | INTRAMUSCULAR | Status: DC | PRN
Start: 2017-04-13 — End: 2017-04-14
  Filled 2017-04-13: qty 2

## 2017-04-13 MED ORDER — RAMELTEON 8 MG OR TABS
8.0000 mg | ORAL_TABLET | Freq: Every evening | ORAL | Status: DC
Start: 2017-04-13 — End: 2017-04-14
  Administered 2017-04-13: 8 mg via ORAL
  Filled 2017-04-13: qty 1

## 2017-04-13 MED ORDER — ACETAMINOPHEN 325 MG PO TABS
975.0000 mg | ORAL_TABLET | Freq: Three times a day (TID) | ORAL | Status: DC
Start: 2017-04-13 — End: 2017-04-14
  Administered 2017-04-14: 975 mg via ORAL
  Filled 2017-04-13 (×2): qty 3

## 2017-04-13 MED ORDER — SODIUM CHLORIDE 0.9 % IV BOLUS
1000.0000 mL | INJECTION | Freq: Once | INTRAVENOUS | Status: AC
Start: 2017-04-13 — End: 2017-04-13
  Administered 2017-04-13: 1000 mL via INTRAVENOUS

## 2017-04-13 MED ORDER — OXYCODONE HCL 10 MG OR TABS
10.0000 mg | ORAL_TABLET | ORAL | Status: DC | PRN
Start: 2017-04-13 — End: 2017-04-14

## 2017-04-13 MED ORDER — FENTANYL CITRATE (PF) 100 MCG/2ML IJ SOLN
INTRAMUSCULAR | Status: DC | PRN
Start: 2017-04-13 — End: 2017-04-13
  Administered 2017-04-13 (×2): 50 ug via INTRAVENOUS

## 2017-04-13 MED ORDER — SODIUM CHLORIDE 0.9 % IV SOLN
INTRAVENOUS | Status: DC
Start: 2017-04-13 — End: 2017-04-13
  Administered 2017-04-13: 15:00:00 via INTRAVENOUS

## 2017-04-13 MED ORDER — SENNA 8.6 MG OR TABS
2.0000 | ORAL_TABLET | Freq: Every morning | ORAL | Status: DC
Start: 2017-04-14 — End: 2017-04-14
  Administered 2017-04-14: 17.2 mg via ORAL
  Filled 2017-04-13: qty 2

## 2017-04-13 MED ORDER — OXYCODONE HCL 5 MG OR TABS
5.0000 mg | ORAL_TABLET | ORAL | Status: DC | PRN
Start: 2017-04-13 — End: 2017-04-14
  Administered 2017-04-14: 5 mg via ORAL
  Filled 2017-04-13: qty 1

## 2017-04-13 MED ORDER — SODIUM CHLORIDE 0.9% TKO INFUSION
INTRAVENOUS | Status: DC | PRN
Start: 2017-04-13 — End: 2017-04-14

## 2017-04-13 MED ORDER — SODIUM CHLORIDE 0.9 % IJ SOLN (CUSTOM)
3.0000 mL | INTRAMUSCULAR | Status: DC | PRN
Start: 2017-04-13 — End: 2017-04-14

## 2017-04-13 MED ORDER — LIDOCAINE HCL 1 % IJ SOLN
INTRAMUSCULAR | Status: DC | PRN
Start: 2017-04-13 — End: 2017-04-13
  Administered 2017-04-13: 5 mL via INTRADERMAL

## 2017-04-13 MED ORDER — IOHEXOL 240 MG/ML IJ SOLN
50.0000 mL | Freq: Once | INTRAMUSCULAR | Status: AC
Start: 2017-04-13 — End: 2017-04-13
  Administered 2017-04-13: 50 mL via RECTAL
  Filled 2017-04-13: qty 50

## 2017-04-13 SURGICAL SUPPLY — 11 items
APPLICATOR CHLORAPREP 26ML, ~~LOC~~ (Misc Medical Supply) ×2 IMPLANT
BAG DRAINAGE NEPHROSTOMY 600ML (Misc Medical Supply) ×2 IMPLANT
CATHETER DRAIN MULTIPURPOSE MAC-LOC 10.2FR X 25CM (Lines/Drains) ×2
CONNECTOR TUBING STD 050040 (Tubing/suction) ×2
DILATOR AQ HYDROPHILIC 10FR X 20CM (Procedural wires/sheaths/catheters/balloons/dilators) ×2
GUIDEWIRE AMPLATZ SUPER STIFF 0.035" X 75CM, 7CM TAPER, STRAIGHT (Procedural wires/sheaths/catheters/balloons/dilators) ×2 IMPLANT
NEEDLE YUEH CENTESIS CATH 5FR X 10CM (Needles/punch/cannula/biopsy) ×2
PREP CHLOROPREP 10.5ML 1-STEP (Misc Medical Supply) IMPLANT
PROCEDURE PACK - IR NON-VASCULAR (Procedure Packs/kits) ×2 IMPLANT
SUTURE ETHILON 2-0 18" FS (Suture) ×2 IMPLANT
TOWELS OR BLUE 4-PACK STERILE, DISPOSABLE (Drapes/towels)

## 2017-04-13 NOTE — ED Provider Notes (Signed)
Emergency Department Attending Note    Pedro Shannon  MRN: [id number]  DOB: Dec 02, 1950  PMD: Cleopatra Cedar    CC: Abdominal Pain (pt [facility] from home - had 2 diverticulum removed from bladder wednesday - has foley catheter - has decreased output since last night. +abd distention - hasnt had a BM since the surgery, isnt passing gas.  Dr. Nani Skillern sent pt in for eval. 18LAC tko.  180palp - 104hr fsg 173mg /dl. )      HPI: Pt seen and examined. Pt is a 67 year old male  has a past medical history of Prostate cancer (CMS-HCC). and presents with abdominal pain and distension for 3 days.  Patient states he had laparoscopic bladder diverticulum removal 3 days ago and has not had any bowel movement or any passage of gas since surgery.  Patient states his Foley catheter has not been draining since night.  Patient denies nausea, vomiting, fever, chills, chest pain, shortness of breath, headache or dizziness.  Patient states he has not had much to eat or drink.    Review of Systems   All other systems reviewed and negative unless otherwise noted in the HPI or above. This was done per my custom and practice for systems appropriate to the chief complaint in an emergency department setting and varies depending on the quality of history that the patient is able to provide.         PMH-  Past Medical History:   Diagnosis Date    Prostate cancer (CMS-HCC)        PSH -   Past Surgical History:   Procedure Laterality Date    NASAL SEPTOPLASTY      R inguinal hernia         Prior to Admission Medications   Prescriptions Last Dose Informant Patient Reported? Taking?   HYDROcodone-acetaminophen (NORCO) 5-325 MG tablet   No No   Sig: Take 0.5 tablets by mouth every 8 hours as needed for Severe Pain (Pain Score 7-10) or Breakthrough Pain.   acetaminophen (TYLENOL) 325 MG tablet   No No   Sig: Take 2 tablets (650 mg) by mouth every 6 hours as needed for Mild Pain (Pain Score 1-3).   docusate sodium (COLACE) 100 MG capsule   No  No   Sig: Take 1 capsule (100 mg) by mouth 2 times daily.   sulfamethoxazole-trimethoprim (BACTRIM DS, SEPTRA DS) 800-160 MG tablet   No No   Sig: Take 1 tablet by mouth 2 times daily for 2 doses. Start next week on morning of catheter removal day.      Facility-Administered Medications: None       ALLERGIES: Dutasteride and Rogaine [minoxidil]    SOCIAL HISTORY:   Social History     Tobacco Use    Smoking status: Never Smoker    Smokeless tobacco: Never Used   Substance Use Topics    Alcohol use: No    Drug use: No       FAMILY HISTORY: family history includes Lung Cancer in his mother; Melanoma Cancer in his mother; Other in his father.    I have reviewed the patient's medical history as available in EPIC.      PHYSICAL EXAM:  BP 143/99    Pulse 90    Temp 97.9 F (36.6 C)    Resp 18    Ht 6' (1.829 m)    SpO2 93%    BMI 27.23 kg/m   GEN:  Alert and oriented.  Patient looks moderately dehydrated, otherwise looks well nourished, and non-toxic.  HEENT: Normocephalic and atraumatic. Pupils equal, round, and reactive to light. Extraocular motions intact. Mucous membranes moist. Naso-oropharynx clear.   Neck: No lymphadenopathy or jugular venous distention.   Chest: Clear to auscultation bilaterally with normal respiratory effort.   Cardiac: Regular rate and rhythm,    Abdomen: Soft, distended, bowel sounds present, there is diffuse tenderness in all 4 quadrants without rebound.  Laparoscopic incision sites are without any signs of infection..  Back: No stepoffs, deformities, or tenderness. No costovertebral angle tenderness.   GU:  Normal male phallus.  Foley catheter is in place without any drainage around the catheter insertion  Extremities: No cyanosis or edema. Neurovascularly intact.   Psychiatric: appropriate   Skin: no jaundice or rash   Neurologic: Alert and oriented x4. Normal speech and language. Normal gait.         DIAGNOSTIC STUDIES AND INTERPRETATIONS:     Ct Abdomen And Pelvis W/o  Contrast    Result Date: 04/13/2017  IMPRESSION: CT Cystogram 1. Focal areas of bladder perforation along the right side of the bladder, characterized by extravasation of contrast along the right lateral and right posterolateral aspects of the bladder, communicating into fluid seen within the intraperitoneal space. Additional scattered foci of pneumoperitoneum. 2. Foley catheter balloon is within the prostatic urethra. Repositioning is recommended. 3. Extensive subcutaneous emphysema involving the chest and abdominal wall, likely postprocedural. Critical Results:  Significant findings delineated above were seen at 13:41 on 04/13/2017 and verbally communicated by Andre Lefort to DR. Boluwatife Flight At 13:41 on 04/13/2017.   CTRM:2001:verbal.       Labs:  Results for orders placed or performed during the hospital encounter of 04/13/17   CBC w/ Diff [place]   Result Value Ref Range    WBC 8.7 4.0 - 10.0 1000/mm3    RBC 4.84 4.60 - 6.10 mill/mm3    Hgb 15.9 13.7 - 17.5 gm/dL    Hct 47.2 40.0 - 50.0 %    MCV 97.5 (H) 79.0 - 95.0 um3    MCH 32.9 (H) 26.0 - 32.0 pgm    MCHC 33.7 32.0 - 36.0 g/dL    RDW 13.2 12.0 - 14.0 %    MPV 10.5 9.4 - 12.4 fL    Plt Count 231 140 - 370 1000/mm3    Segs 81 %    Lymphocytes 7 %    Monocytes 11 %    Eosinophils 2 %    Basophils 0 %    ANC-Automated 7.0 1.6 - 7.0 1000/mm3    Abs Lymphs 0.6 (L) 0.8 - 3.1 1000/mm3    Abs Monos 0.9 (H) 0.2 - 0.8 1000/mm3    Abs Eosinophils 0.2 <0.1 - 0.5 1000/mm3    Abs Basophils 0.0 <0.1 1000/mm3    Diff Type Automated    Comprehensive Metabolic Panel Green   Result Value Ref Range    Glucose 137 (H) 70 - 99 mg/dL    BUN 53 (H) 8 - 23 mg/dL    Creatinine 5.08 (H) 0.67 - 1.17 mg/dL    GFR 11 mL/min    Sodium 135 (L) 136 - 145 mmol/L    Potassium 4.8 3.5 - 5.1 mmol/L    Chloride 98 98 - 107 mmol/L    Bicarbonate 19 (L) 22 - 29 mmol/L    Anion Gap 18 (H) 7 - 15 mmol/L    Calcium 8.6 8.5 - 10.6 mg/dL    Total Protein 6.4  6.0 - 8.0 g/dL    Albumin 3.5 3.5 - 5.2 g/dL     Bilirubin, Tot 0.71 <1.2 mg/dL    AST (SGOT) 29 0 - 40 U/L    ALT (SGPT) 18 0 - 41 U/L    Alkaline Phos 58 40 - 129 U/L   Lipase, Blood Green Plasma Separator Tube   Result Value Ref Range    Lipase 22 13 - 60 U/L       All lab results reviewed. Pertinent findings discussed with the patient. He understands the findings.         EMERGENCY ROOM COURSE AND MEDICAL DECISION MAKING:   Medications   iohexol (OMNIPAQUE 240) 240 MG/ML solution 50 mL (not administered)   ALPRAZolam (XANAX) tablet 0.5 mg (not administered)   sodium chloride 0.9 % bolus 1,000 mL (0 mL IntraVENOUS Stopped 04/13/17 1025)   HYDROmorphone (DILAUDID) injection 1 mg (1 mg IntraVENOUS Given 04/13/17 1025)   HYDROmorphone (DILAUDID) injection 1 mg (1 mg IntraVENOUS Given 04/13/17 1241)     Patient was given above medication with good relief.    Urology consulted who evaluated patient and will admit.  Patient's catheter initially irrigated without any success.  Urology is trying to manipulate his Foley catheter for better function.    Diagnosis:     ICD-10-CM ICD-9-CM    1. Abdominal pain, generalized R10.84 789.07    2. Bladder and urethra injury, initial encounter S37.20XA 867.0     S37.30XA     3. Acute renal injury (CMS-HCC) N17.9 584.9        PLAN:  Admit    CONDITION: STABLE  D/w pt who understands and agrees with plan as stated, all questions answered         Valentina Shaggy, DO  04/13/17 1358

## 2017-04-13 NOTE — ED Notes (Signed)
Bed: 16  Expected date:   Expected time:   Means of arrival:   Comments:  53M abd pain - recent abd surgery - bruising to site. Slightly tachy otherwise stable.

## 2017-04-13 NOTE — ED Notes (Signed)
Pt placed on monitor [address]. 94% RA saturations

## 2017-04-13 NOTE — ED Notes (Signed)
Urology at bedside.

## 2017-04-13 NOTE — H&P (Signed)
UROLOGY CONSULT NOTE    CONSULT RE:  "Clogged catheter"    History of Present Illness:     Pedro Shannon is a 67 year old M well known to urology recently s/p robotic diverticulectomy of bladder 04/10/17 who presents with non-draining catheter since 4pm yesterday. He has extreme abdominal pain and and (+)nausea/vomiting. No fever or chills.    ROS:  Constitutional: negative.   Eyes: negative.   Ears, Nose, Mouth, Throat: negative.   CV: negative.   Resp: negative.  GI: negative.   GU: as noted in HPI.   Musculoskeletal: negative.   Integumentary: negative.   Neuro: negative.   Psych: negative.   Endo: negative.   Heme/Lymphatic: negative.   Allergy/Immun: negative.     Past Medical and Surgical History:  Past Medical History:   Diagnosis Date    Prostate cancer (CMS-HCC)      Past Surgical History:   Procedure Laterality Date    NASAL SEPTOPLASTY      R inguinal hernia         Allergies:  Allergies   Allergen Reactions    Dutasteride Hives    Rogaine [Minoxidil] Hives       Medications:  No current facility-administered medications on file prior to encounter.      Current Outpatient Medications on File Prior to Encounter   Medication Sig Dispense Refill    acetaminophen (TYLENOL) 325 MG tablet Take 2 tablets (650 mg) by mouth every 6 hours as needed for Mild Pain (Pain Score 1-3). 30 tablet 0    docusate sodium (COLACE) 100 MG capsule Take 1 capsule (100 mg) by mouth 2 times daily. 60 capsule 0    HYDROcodone-acetaminophen (NORCO) 5-325 MG tablet Take 0.5 tablets by mouth every 8 hours as needed for Severe Pain (Pain Score 7-10) or Breakthrough Pain. 8 tablet 0    [START ON 04/17/2017] sulfamethoxazole-trimethoprim (BACTRIM DS, SEPTRA DS) 800-160 MG tablet Take 1 tablet by mouth 2 times daily for 2 doses. Start next week on morning of catheter removal day. 2 tablet 0       Social History:  Social History     Socioeconomic History    Marital status: Married     Spouse name: Not on file    Number of  children: Not on file    Years of education: Not on file    Highest education level: Not on file   Occupational History    Occupation: Chief Executive Officer   Tobacco Use    Smoking status: Never Smoker    Smokeless tobacco: Never Used   Substance and Sexual Activity    Alcohol use: No    Drug use: No    Sexual activity: Not on file   Social Activities of Daily Living Present    Not on file   Social History Narrative    Not on file       Family History:  Family History   Problem Relation Name Age of Onset    Lung Cancer Mother      Melanoma Cancer Mother      Other Father          esophageal cancer       ----------------------------------------------------------------------------------------------    Physical Exam:  BP  Min: 143/99  Max: 157/91  Temp  Min: 97.8 F (36.6 C)  Max: 97.9 F (36.6 C)  Pulse  Min: 90  Max: 111  Resp  Min: 18  Max: 18  SpO2  Min: 93 %  Max: 96 %  Height  Min: 6' (182.9 cm)  Max: 6' (182.9 cm)    No intake/output data recorded.    GENERAL: In pain  NEURO: Alert and Oriented x 3  HEENT: normalcephalic/atraumatic  NECK: midline trachea  PULM: non-labored breathing  GI: soft, NT, ND  BACK: No CVAT  GU: Suprapubic fullness. Catheter unable to be seated properly despite multiple attempts.  SKIN: no obvious rashes or lesions  EXT: warm and well perfused.   PSYCH: mood appropriate      Laboratory :  Recent Labs     04/11/17  0519 04/13/17  0553   WBC 11.5* 8.7   HGB 15.5 15.9   HCT 46.3 47.2   PLT 232 231   SEG  --  81   LYMPHS  --  7   MONOS  --  11        Recent Labs     04/11/17  0519 04/13/17  0553   NA 138 135*   K 4.9 4.8   CL 103 98   BICARB 24 19*   BUN 18 53*   CREAT 1.43* 5.08*   GLU 155* 137*   [place] 8.4* 8.6   MG 1.9  --    PHOS 2.1*  --      Recent Labs     04/13/17  0553   ALK 58   AST 29   ALT 18   TBILI 0.71   ALB 3.5          No results for input(s): PT, PTT, INR in the last 72 hours.    No results found for: COLORUA, APPEARUA, [place], BILIUA, KETONEUA, SGUA, BLOODUA, PHUA,  PROTEINUA, UROBILUA, NITRITEUA, LEUKESTUA, WBCUA, RBCUA, EPITHCELLSUA, HYALINEUA, CRYSTALSUA, COMMENTSUA    Lab Results   Component Value Date    PSA 2.12 05/08/2016    PSA 2.92 07/26/2015       Lab Results   Component Value Date    CREAT 5.08 (H) 04/13/2017    CREAT 1.43 (H) 04/11/2017    CREAT 0.89 07/26/2015       Microbiology:   Microbiology Results (last 30 days)     Procedure Component Value - Date/Time    MRSA Surveillance Culture [place] Nares [[id number]] Collected:  04/11/17 0426    Lab Status:  Final result Specimen:  Nares Updated:  04/12/17 1242     MRSA Surveillance Result No Methicillin Resistant Staphylococcus aureus isolated.    Urine Culture - See Instructions [[id number]] Collected:  04/08/17 1420    Lab Status:  Final result Specimen:  Urine Updated:  04/10/17 1445     Urine Culture Result No Growth          Radiology:  X-ray Abdomen Complete W/chest 1 View    Result Date: 04/13/2017  Narrative: EXAM DESCRIPTION:  X-RAY ABDOMEN COMPLETE W/CHEST 1 VIEW CLINICAL HISTORY: Abdominal pain COMPARISON: None available. FINDINGS: Fair inspiratory effort. Bibasilar subsegmental atelectasis. Cardiomediastinal silhouette grossly unremarkable. Free intraperitoneal air as well as diffuse subcutaneous emphysema throughout the abdomen and pelvis. Gas is noted in the colon to the region of the sigmoid. Multiple air-filled small bowel loops centrally suggest ileus. Catheter noted in the region of the bladder.    Signed by: Lauralee Evener 04/13/2017 13:22:46    Impression: IMPRESSION: Free intraperitoneal air and extensive subcutaneous emphysema 3 days postoperative. Patient is undergoing a CT concurrently for excluding bladder rupture. Likely postoperative ileus.    Ct Abdomen And Pelvis W/o Contrast  Result Date: 04/13/2017  Narrative: EXAM DESCRIPTION: CT ABDOMEN AND PELVIS W/O CONTRAST CLINICAL HISTORY: Recent history of robotic assisted laparoscopic bladder diverticulectomy 04/10/2017. Abdominal distension  and decreased output. TECHNIQUE: COVERAGE: Abdomen and pelvis IV CONTRAST: None PHASES ACQUIRED: Noncontrast abdomen and pelvis before filling of the urinary bladder with 200 cc of dilute solution of Omnipaque 350 contrast. Subsequent imaging of the pelvis was performed. POSITIVE ORAL CONTRAST GIVEN: None ADVERSE EVENTS: None RECONSTRUCTIONS: Axial 3.75mm and sagittal/coronal 3mm Up-to-date CT equipment and radiation dose reduction techniques were employed. CTDIvol: 17.6 - 18.0 mGy. DLP: 1834 mGy-cm. COMPARISON: MRI pelvis 08/17/2015 FINDINGS: LUNG BASES: Bilateral dependent consolidation/atelectasis. LIVER: Unremarkable. BILIARY: Unremarkable. No intrahepatic or extrahepatic biliary ductal dilatation. SPLEEN: Unremarkable. PANCREAS: Unremarkable. ADRENALS: Unremarkable. KIDNEYS: Unremarkable. STOMACH & DUODENUM: Unremarkable. VASCULATURE: Unremarkable LYMPHATIC: Unremarkable SMALL & LARGE BOWEL: Colonic diverticula without evidence of diverticulitis. BLADDER/PELVIC ORGANS: Foley catheter with Foley balloon within the prostatic urethra. There is extravasation of contrast instilled within the bladder seen along the right lateral aspect of the bladder with communication into the fluid seen within the intraperitoneal space (coronal series 604, image 63), which primarily tracks along the right paracolic gutter and extends to the hepatic dome. Additional area of contrast extravasation seen more posteriorly along the right posterolateral aspect of the bladder (series 5, axial image 67). BONES/SOFT TISSUES: Unremarkable OTHER: Multiple foci of pneumoperitoneum. Extensive subcutaneous emphysema involving the abdominal wall and pelvis, extending superiorly into the chest wall, likely postprocedural. CONCURRENT SUPERVISION: I have reviewed the images and agree with the resident interpretation. DOSE STATEMENT: "[facility] CT scanners employ modern techniques for CT dose reduction, including protocol review,  automatic exposure control, and iterative reconstruction techniques. These features assure that radiation dose levels in CT are optimized and are consistent with state-of-the-art, low dose CT practice."    Preliminary created by: Wang, Wilbur     Impression: IMPRESSION: CT Cystogram 1. Focal areas of bladder perforation along the right side of the bladder, characterized by extravasation of contrast along the right lateral and right posterolateral aspects of the bladder, communicating into fluid seen within the intraperitoneal space. Additional scattered foci of pneumoperitoneum. 2. Foley catheter balloon is within the prostatic urethra. Repositioning is recommended. 3. Extensive subcutaneous emphysema involving the chest and abdominal wall, likely postprocedural. Critical Results:  Significant findings delineated above were seen at 13:41 on 04/13/2017 and verbally communicated by Wilbur Wang to DR. MOSTAMAND At 13:41 on 04/13/2017.   CTRM:2001:verbal.     Us Kidney Complete    Result Date: 04/11/2017  Narrative: EXAM DESCRIPTION: US KIDNEY COMPLETE CLINICAL HISTORY: Elevated creatinine status post robotic diverticulectomy. TECHNIQUE: Per Protocol including inflow and outflow with real time grey scale, color and spectral duplex doppler. COMPARISON: None available FINDINGS: RIGHT KIDNEY: 10.54 cm in long axis. No hydronephrosis or renal calculi. LEFT KIDNEY: 10.43 cm in long axis. Limited visibility of the left kidney. BLADDER: Not visualized. Patient noted to have Foley. Pneumoperitoneum limits evaluation of the abdomen. CONCURRENT SUPERVISION: I have reviewed the images and agree with the resident's interpretation.    Preliminary created by: Retson, Tara Signed by: Rakow-Penner, Rebecca 04/11/2017 15:37:28    Impression: IMPRESSION: 1. Expected post operative pneumoperitoneum severely limits visualization of left kidney. No hydronephrosis is seen in the right kidney.              ______________________________________________________________________  Assessment and Plan:  66M recently s/p robotic diverticulectomy 04/10/17 with dysfunction catheter since 4pm yesterday now with intraperitoneal bladder rupture, peritoneal absorption of Cr.    -  IR for urgent SPT  -Admit urology  -Pain control  -Trend labs    ---  Alvira Monday, MD  Urology Resident Physician  [place]    Urology pagers  Please do not hesitate to contact the appropriate service with any questions or concerns.  Prudence Davidson ([facility]): [phone]  [place]: [phone]

## 2017-04-13 NOTE — Brief Op Note (Signed)
BRIEF OPERATIVE NOTE    DATE: 04/13/2017  TIME: 4:37 PM    PREOPERATIVE DIAGNOSIS: bladder perforation and inability to place foley    POSTOPERATIVE DIAGNOSIS: same    PROCEDURE INFORMATION:  Procedure(s):  IR CT PELVIC DRAIN    ATTENDING SURGEON:   Surgeon(s) and Role:     * Clayborne Dana, MD - Primary    ANESTHESIA: moderate sedation    FINDINGS: Distended bladder with air and fluid. Successful placement of 12F MPD in the suprapubic bladder.    SPECIMENS: 5 cc urine sent for culture    Fluids/Blood Products:      IV Fluids: none    Blood Products: none    EBL: 3 cc    Urine Output: 624 cc    COMPLICATIONS: none    DISPOSITION: ED

## 2017-04-13 NOTE — ED Notes (Signed)
CT tech states radiologists is currently assigning protocol for pt  Informed tech that pt requires non-con d/t elevated BUN/crt  Concern for perforation vs obstruction .

## 2017-04-13 NOTE — ED Notes (Signed)
Using 23 G butterfly needle to RAC x 1 attempt  labs drawn labeled and sent to lab  Gauze applied to control bleeding.

## 2017-04-13 NOTE — ED Notes (Signed)
Clear diet given to pt

## 2017-04-13 NOTE — ED Notes (Signed)
Report given to Anderson [place]

## 2017-04-13 NOTE — ED Floor Report (Addendum)
ED to IP Handoff    Report created by Merri Brunette, RN at 12:29 PM 04/13/2017.     HANDOFF REPORT UPDATE/CHANGES (changes in patient status/care/events prior to transfer)  By who: E. Youssef Footman, RN  Time:  1750  Additional information: supra pubic cath placed in IR. 250 cc of urine emptied, urine specimen sent. Indwelling foley cath remain in penis. Pt remains slightly tachycardic 100-110s. Receiving 100 ml Ns/hr                                                                                                                                                     Pedro Shannon is a 67 year old male.    Brief Summary of ED Visit (to include focused assessment and neuro status):  77M  pmhx Prostate cancer who presents with abdominal pain and distension for 3 days since having laparoscopic bladder diverticulum removal 04/10/17.  Pt states last BM was prior to surgery "small amount" and has not had any flatulence since surgery. +nausea. No vomiting. +dry heaving. PT has not been able to eat or drink d/t discomfort of abdomen. Also, patient reports decreased output in indwelling foley cath, "teaspoon" since last night. Denies f/c.     Cystogram:   Focal areas of bladder perforation along the right side of the bladder, characterized by extravasation of contrast along the right lateral and right posterolateral aspects of the bladder, communicating into fluid seen within the intraperitoneal space. Additional scattered foci of pneumoperitoneum.      RN shift assessment exceptions to WDL: abd distention. Dry indwelling foley bag. +nausea.     Any significant events and interventions with responses:  IV, pain control, CT    Radiologic studies not completed: NA  (None unless otherwise noted)    Chief Complaint   Patient presents with    Abdominal Pain     pt bib medics from home - had 2 diverticulum removed from bladder wednesday - has foley catheter - has decreased output since last night. +abd distention - hasnt had  a BM since the surgery, isnt passing gas.  Dr. Nani Skillern sent pt in for eval. 18LAC tko.  180palp - 104hr fsg 173mg /dl.        Admitted for: abdominal pain    Code Status:  Please refer to In-pt admitting doctors orders     Level of Care: IMU     Is patient septic? no If yes, complete below:    BC x 2 drawn? no  If No explain:  NA    Repeat lactate needed? no  If Yes, when is it due?  NA    All initial antibiotics given?  no  If No, explain:  NA    Amount of IV fluids received 500 ml    Is patient on Heparin? no If yes, complete below:  Time Heparin bolus was given: NA    Additional drips patient is on: NA    Cardiac rhythm: ST    Oxygen Delivery: None    Past Medical History:   Diagnosis Date    Prostate cancer (CMS-HCC)        Past Surgical History:   Procedure Laterality Date    NASAL SEPTOPLASTY      R inguinal hernia         Allergies: Dutasteride and Rogaine [minoxidil]    ED Fall Risk:      Skin issues:  no    >> If yes, note areas of skin breakdown. See appropriate photos.      Ambulatory:  yes    Sitter needed: no    Suicide Risk:  no    Isolation Required: no     >> If yes , what type of isolation: NA    Is patient in custody?  no    Is patient in restraints? no    Vitals:    04/13/17 1635 04/13/17 1650 04/13/17 1651 04/13/17 1800   BP: 149/81 139/83 124/87 134/79   BP Location:       BP Patient Position:       Pulse: 118 112  109   Resp:       Temp:       SpO2: 94% 95%  95%   Height:           Tubes Collected: (S) Blue, Green PST, Purple (Blood Bank), Lavender (04/13/17 1517 : Brandye Inthavong, Lodema Hong, RN)    Lab Results   Component Value Date    WBC 8.7 04/13/2017    RBC 4.84 04/13/2017    HGB 15.9 04/13/2017    HCT 47.2 04/13/2017    MCV 97.5 (H) 04/13/2017    MCHC 33.7 04/13/2017    RDW 13.2 04/13/2017    PLT 231 04/13/2017    MPV 10.5 04/13/2017       Lab Results   Component Value Date    NA 135 (L) 04/13/2017    K 4.8 04/13/2017    CL 98 04/13/2017    BICARB 19 (L) 04/13/2017    BUN 53 (H) 04/13/2017     CREAT 5.08 (H) 04/13/2017    GLU 137 (H) 04/13/2017    Bowlegs 8.6 04/13/2017       No results found for: BNP, PHOS, MG, LACTATE, AMMONIA, IONCA, ARTIONCA    No results found for: CPK, CKMBH, TROPONIN    No results found for: PH, PCO2, O2CONTENT, IVHC3, IVBE, O2SAT, UNPH, UNPCO2, ARTPH, ARTPCO2, ARTO2CNT, IAHC3, IABE, ARTO2SAT, UNAPH, UNAPCO2    No results found for this visit on 04/13/17.      Patient Lines/Drains/Airways Status    Active PICC Line / CVC Line / PIV Line / Drain / Airway / Intraosseous Line / Epidural Line / ART Line / Line Type / Wound     Name: Placement date: Placement time: Site: Days:    Peripheral IV - 18 G Left Antecubital  04/13/17   --   Antecubital  less than 1    Closed/Suction Drain -  Anterior Other (Comment)  04/13/17   1634   Other (Comment)  less than 1    Indwelling Urinary Catheter -  07/18/16 1028 OR Standard (Latex) 16 fr 10 ml Yes  07/18/16   1028   Standard (Latex)  269  ED Handoff Report is ready for review.  Admitting RN may reach Emergency Department RN, Merri Brunette, RN, at [phone] with any questions.

## 2017-04-13 NOTE — Progress Notes (Signed)
PRE-PROCEDURE RECORD FOR SEDATION    Planned procedure:  Supra pubic catheter    Planned type of sedation:   Moderate sedation.  I have discussed this procedure, its potential risks and complications, the risks associated with refusal of the procedure, and alternatives to the procedure with this patient.  Planned sedation route:  IV.    The patient has consented to the procedure.    Sedation/Anesthesia History:    The patient has no history of an adverse reaction to sedation or anesthesia.    I have assessed the patient's status immediately prior to this procedure:  Yes  I have discussed pain management needs and options for the patient with the patient or caregiver:  Yes    Sedation options, risks, and plans have been discussed with the patient or caregiver.  Questions were answered.  The patient or caregiver agrees to proceed as planned.    Airway History and Assessment:      Airway Class:  Class II.  Neck range of motion  normal.    Potential airway issues?  None    ASA classification of physical status:  2.  A patient with mild systemic disease.    --    Eppie Gibson Toan Najat Olazabal     04/13/17     3:41 PM

## 2017-04-13 NOTE — ED Notes (Signed)
Attempting to call report to [facility]

## 2017-04-13 NOTE — ED Notes (Signed)
Pt returned from IR after having suprapubic catheter placed  Clarise Cruz, IR RN reports that pt received:  2 mg versed  100 mcg fentanyl  250 cc of urine emptied  Urine specimen, labeled and sent to lab

## 2017-04-13 NOTE — ED Notes (Signed)
Surgery at bedside for consult

## 2017-04-13 NOTE — ED Notes (Signed)
Dr. Mostamond at bedside for MSE

## 2017-04-13 NOTE — ED Notes (Signed)
Spoke with CT staff regarding order for CT a/p and time of study  CT states pt will need cystoscopy and ED RN will need to push contrast thru indwelling catheter.  Dr. Bluford Main notified of this study and re-assure this RN that it is neccessary that pt receives this study in order to r/o perforation of the bladder and that contrast and volume of contrast will not be harmful to patient.   Pt informed about study and is agreeable to study.  Pt is requesting additional pain medications.

## 2017-04-13 NOTE — ED Notes (Signed)
Dr. Starr Sinclair and Dr. Armanda Magic at bedside re-evaluation.   Indwelling foley cath removed by dr Dierdre Searles.

## 2017-04-13 NOTE — ED Notes (Signed)
Attempted to irrigate indwelling foley cath  Instill 60 cc of sterile saline into foley cath port  Instilled fluids with no difficulties, no resistance  Aspirated 10 cc of pale yellow urine, unable to aspirate more, meeting resistance.   Dr. Waylan Rocher at bedside noting difficulty.     Urology will come and evaluate patient.

## 2017-04-13 NOTE — ED Notes (Signed)
Attempting to call report. No answer

## 2017-04-13 NOTE — ED Notes (Signed)
Pt to IR for suprapubic catheter.   Report given to Hezzie Bump RN to assume care for this pt

## 2017-04-13 NOTE — ED Notes (Signed)
Spoke with Dr. Noni Saupe.   notified him that pt is tachycardia 110s and kidney function is elevated  Urology believes that pain is contributing to tachycardia.

## 2017-04-14 LAB — BASIC METABOLIC PANEL, BLOOD
Anion Gap: 13 mmol/L (ref 7–15)
BUN: 44 mg/dL — ABNORMAL HIGH (ref 8–23)
Bicarbonate: 21 mmol/L — ABNORMAL LOW (ref 22–29)
Calcium: 8.3 mg/dL — ABNORMAL LOW (ref 8.5–10.6)
Chloride: 104 mmol/L (ref 98–107)
Creatinine: 2.05 mg/dL — ABNORMAL HIGH (ref 0.67–1.17)
GFR: 33 mL/min
Glucose: 118 mg/dL — ABNORMAL HIGH (ref 70–99)
Potassium: 4.1 mmol/L (ref 3.5–5.1)
Sodium: 138 mmol/L (ref 136–145)

## 2017-04-14 LAB — HEMOGRAM, BLOOD
Hct: 41 % (ref 40.0–50.0)
Hgb: 13.7 gm/dL (ref 13.7–17.5)
MCH: 32.8 pg — ABNORMAL HIGH (ref 26.0–32.0)
MCHC: 33.4 g/dL (ref 32.0–36.0)
MCV: 98.1 um3 — ABNORMAL HIGH (ref 79.0–95.0)
MPV: 10.2 fL (ref 9.4–12.4)
Plt Count: 234 10*3/uL (ref 140–370)
RBC: 4.18 10*6/uL — ABNORMAL LOW (ref 4.60–6.10)
RDW: 13.3 % (ref 12.0–14.0)
WBC: 6.3 10*3/uL (ref 4.0–10.0)

## 2017-04-14 MED ORDER — CALCIUM CARBONATE ANTACID 500 MG OR CHEW
500.0000 mg | CHEWABLE_TABLET | Freq: Four times a day (QID) | ORAL | Status: DC | PRN
Start: 2017-04-14 — End: 2017-04-14
  Administered 2017-04-14: 500 mg via ORAL
  Filled 2017-04-14 (×2): qty 1

## 2017-04-14 MED ORDER — BISACODYL 10 MG RE SUPP
10.0000 mg | Freq: Once | RECTAL | Status: AC
Start: 2017-04-14 — End: 2017-04-14
  Administered 2017-04-14: 10 mg via RECTAL
  Filled 2017-04-14: qty 1

## 2017-04-14 NOTE — Plan of Care (Signed)
Problem: Promotion of Health and Safety  Goal: Promotion of Health and Safety  The patient remains safe, receives appropriate treatment and achieves optimal outcomes (physically, psychosocially, and spiritually) within the limitations of the disease process by discharge.    Information below is the current care plan.  Outcome: Progressing   04/13/17 2030 04/14/17 0437   Adult/Peds Plan of Care   Guidelines --  Inpatient Nursing Guidelines   Individualized Interventions/Recommendations #1 --  Discussed poc with pt upon transfer from ED including pain control regimen, cont cardiac monitoring, and safety precautions.    Individualized Interventions/Recommendations #2 (if applicable) --  Ramelteon ordered as per pt request for sleep aid, c/o insomnia; med given.   Individualized Interventions/Recommendations #3 (if applicable) --  Pt has been ST with PVCs. Enc nonpharmaceutical interventions for persistent hiccupping ie drinking water, ambulation, and freq IS use (deep breathing) when awake.   Individualized Interventions/Recommendations #4 (if applicable) --  Continue strict I&Os including monitoring output from suprapubic catheter.   Outcome Evaluation (rationale for progressing/not progressing) every shift --  Pt with difficulty sleeping d/t hiccups. ST, HR up to 130s when up to restroom; MD made aware. Oxycodone 5mg  given to help with abd pain from hiccupping. Observed pt able to sleep afterwards.    Patient /Family stated Goal   Patient /Family stated Goal sleep --

## 2017-04-14 NOTE — Interdisciplinary (Signed)
Patient previously discussed discharge paperwork with primary RN Rise Paganini. Denies any further questions or needs prior to discharge. Patient taken to main entrance via wheelchair. Patient taking Lyft home.

## 2017-04-14 NOTE — Discharge Summary (Signed)
[facility]   Discharge Summary      Date Today:  04/14/17     Patient Name:  Pedro Shannon  MRN:    [id number]  PCP:   Cleopatra Cedar  Attending:   Kathleen Lime, MD  Service:   Urology    Principal Diagnosis (required):  urinary retention    Hospital Problem List (required):  Active Hospital Problems    Diagnosis    Abdominal pain, generalized [R10.84]      Resolved Hospital Problems   No resolved problems to display.     Past Medical History:   Diagnosis Date    Prostate cancer (CMS-HCC)      Past Surgical History:   Procedure Laterality Date    NASAL SEPTOPLASTY      R inguinal hernia         Additional Hospital Diagnoses ("rule out" or "suspected" diagnoses, etc.):  None    Principal Procedure During This Hospitalization (required):  Procedure(s):  IR CT PELVIC DRAIN on 04/13/2017    Other Procedures Performed During This Hospitalization (required):  X-ray Abdomen Complete W/chest 1 View    Result Date: 04/13/2017  Narrative: EXAM DESCRIPTION:  X-RAY ABDOMEN COMPLETE W/CHEST 1 VIEW CLINICAL HISTORY: Abdominal pain COMPARISON: None available. FINDINGS: Fair inspiratory effort. Bibasilar subsegmental atelectasis. Cardiomediastinal silhouette grossly unremarkable. Free intraperitoneal air as well as diffuse subcutaneous emphysema throughout the abdomen and pelvis. Gas is noted in the colon to the region of the sigmoid. Multiple air-filled small bowel loops centrally suggest ileus. Catheter noted in the region of the bladder.    Signed by: Lauralee Evener 04/13/2017 13:22:46    Impression: IMPRESSION: Free intraperitoneal air and extensive subcutaneous emphysema 3 days postoperative. Patient is undergoing a CT concurrently for excluding bladder rupture. Likely postoperative ileus.    Ct Abdomen And Pelvis W/o Contrast    Result Date: 04/13/2017  Narrative: EXAM DESCRIPTION: CT ABDOMEN AND PELVIS W/O CONTRAST CLINICAL HISTORY: Recent history of robotic assisted laparoscopic bladder  diverticulectomy 04/10/2017. Abdominal distension and decreased output. TECHNIQUE: COVERAGE: Abdomen and pelvis IV CONTRAST: None PHASES ACQUIRED: Noncontrast abdomen and pelvis before filling of the urinary bladder with 200 cc of dilute solution of Omnipaque 350 contrast. Subsequent imaging of the pelvis was performed. POSITIVE ORAL CONTRAST GIVEN: None ADVERSE EVENTS: None RECONSTRUCTIONS: Axial 3.67mm and sagittal/coronal 18mm Up-to-date CT equipment and radiation dose reduction techniques were employed. CTDIvol: 17.5 - 18.0 mGy. DLP: 6301 mGy-cm. COMPARISON: MRI pelvis 08/17/2015 FINDINGS: LUNG BASES: Bilateral dependent consolidation/atelectasis. LIVER: Unremarkable. BILIARY: Unremarkable. No intrahepatic or extrahepatic biliary ductal dilatation. SPLEEN: Unremarkable. PANCREAS: Unremarkable. ADRENALS: Unremarkable. KIDNEYS: Unremarkable. STOMACH & DUODENUM: Unremarkable. VASCULATURE: Unremarkable LYMPHATIC: Unremarkable SMALL & LARGE BOWEL: Colonic diverticula without evidence of diverticulitis. BLADDER/PELVIC ORGANS: Foley catheter with Foley balloon within the prostatic urethra. There is extravasation of contrast instilled within the bladder seen along the right lateral aspect of the bladder with communication into the fluid seen within the intraperitoneal space (coronal series 604, image 63), which primarily tracks along the right paracolic gutter and extends to the hepatic dome. Additional area of contrast extravasation seen more posteriorly along the right posterolateral aspect of the bladder (series 5, axial image 67). BONES/SOFT TISSUES: Unremarkable OTHER: Multiple foci of pneumoperitoneum. Extensive subcutaneous emphysema involving the abdominal wall and pelvis, extending superiorly into the chest wall, likely postprocedural. CONCURRENT SUPERVISION: I have reviewed the images and agree with the resident interpretation. DOSE STATEMENT: "[place] CT scanners employ modern techniques for  CT dose reduction,  including protocol review, automatic exposure control, and iterative reconstruction techniques. These features assure that radiation dose levels in CT are optimized and are consistent with state-of-the-art, low dose CT practice."    Preliminary created by: Andre Lefort Signed by: Lauralee Evener 04/13/2017 14:55:35    Impression: IMPRESSION: CT Cystogram 1. Focal areas of bladder perforation along the right side of the bladder, characterized by extravasation of contrast along the right lateral and right posterolateral aspects of the bladder, communicating into fluid seen within the intraperitoneal space. Additional scattered foci of pneumoperitoneum. 2. Foley catheter balloon is within the prostatic urethra. Repositioning is recommended. 3. Extensive subcutaneous emphysema involving the chest and abdominal wall, likely postprocedural. Critical Results:  Significant findings delineated above were seen at 13:41 on 04/13/2017 and verbally communicated by Andre Lefort to DR. MOSTAMAND At 13:41 on 04/13/2017.   CTRM:2001:verbal.     US Kidney Complete    Result Date: 04/11/2017  Narrative: EXAM DESCRIPTION: US KIDNEY COMPLETE CLINICAL HISTORY: Elevated creatinine status post robotic diverticulectomy. TECHNIQUE: Per Protocol including inflow and outflow with real time grey scale, color and spectral duplex doppler. COMPARISON: None available FINDINGS: RIGHT KIDNEY: 10.54 cm in long axis. No hydronephrosis or renal calculi. LEFT KIDNEY: 10.43 cm in long axis. Limited visibility of the left kidney. BLADDER: Not visualized. Patient noted to have Foley. Pneumoperitoneum limits evaluation of the abdomen. CONCURRENT SUPERVISION: I have reviewed the images and agree with the resident's interpretation.    Preliminary created by: Maury Dus Signed by: Talbert Forest 04/11/2017 15:37:28    Impression: IMPRESSION: 1. Expected post operative pneumoperitoneum severely limits visualization of left kidney. No  hydronephrosis is seen in the right kidney.     Ir Ct Guidance With Catheter    Result Date: 04/14/2017  Narrative: EXAM DESCRIPTION: IR CT GUIDANCE WITH CATHETER CLINICAL HISTORY: 67 year old male recently s/p robotic bladder diverticulectomy 04/10/17 with dysfunction of Foley catheter and bladder rupture. Patient requires suprapubic catheter for drainage. PROCEDURE: 1.  CT guided supra pubic catheter placement 2.  Moderate sedation was provided by an IR nurse under constant supervision of the Interventional Radiologist. Sedation time was 20 minutes. CONSENT: The risks benefits and alternatives to the procedure were discussed with the patient. The patient  verbalized understanding and the desire to proceed. Written informed consent was obtained. Sterile precautions were used. TECHNIQUE: Patient was placed in the supine position. Using intermittent CT guidance, a needle was inserted into the bladder. A short Amplatz wire was advanced and coiled in the collection. Following tract dilation, a 10.2 F MPD drainage catheter was advanced and secured using sutures. 10 mL of blood tinged urine was removed and sent to the lab for analysis. The catheter was attached to Foley bag drainage. Complications: None OPERATORS: Staff: Octavio Manns, MD Fellow/Resident/PA/NP: Philmore Pali, MD CONTRAST: None MEDICATIONS: Versed 2 mg IV Fentanyl 100 mcg IV Local Lidocaine DOSE STATEMENT: Up-to-date CT equipment and radiation dose reduction techniques were employed. CTDIvol: 6.2 - 6.7 mGy. DLP: 259 mGy-cm. FINDINGS: Distended bladder with air and fluid. Marked subcutaneous emphysema also noted. Post placement CT demonstrates appropriate position of pigtail catheter. CONCURRENT SUPERVISION: I was present for the procedure and have reviewed the images and report.    Preliminary created by: Linus Salmons Signed by: Danella Deis 04/14/2017 08:30:00    Impression: IMPRESSION: Successful CT guided suprapubic catheter placement with a 10.2 Fr drainage  catheter. Plan: Pigtail catheter can be exchanged for a Foley or an IR placed MIC tube at a future date. Catheter  should be exchanged every 8-12 weeks if it is required long term.       Consultations Obtained During This Hospitalization:  Interventional Radiology    Key consultant recommendations:   10 F drainage catheter placed suprapubically.      Reason for Admission to the Hospital / History of Present Illness:  Pedro Shannon is a 67 year old male who has a past medical history of Prostate cancer (CMS-HCC). He was admitted with Abdominal pain, generalized [R10.84]  Acute renal injury (CMS-HCC) [N17.9]  Bladder and urethra injury, initial encounter [S37.20XA, S37.30XA]. He underwent IR CT PELVIC DRAIN for urinary retention by Dr. Mancel Bale, Mickie Kay, [place] Hospital Course by Problem (required):  Pedro Shannon presented to the ED with a poorly draining urethral foley. He then underwent IR CT guided suprapubic catheter placement for urinary retention by Dr. Mancel Bale, Mickie Kay, MD. The patient tolerated the procedure well. Post-operatively, patient was admitted to the hospital for recovery. The patient had an uneventful post-operative course.    On day of discharge, the patient's pain was well controlled on oral pain medications,   ambulated without difficulty, and tolerated a Diet Regular diet. The patient was discharged with a new suprapubic catheter. He was discharged on 1 Day Post-Op to home. Post discharge plan includes follow up with Dr. Nani Skillern next week. Pt expressed understanding of follow up plans.    Tests Outstanding at Discharge Requiring Follow Up:  ID Type Source Tests Collected by Time Destination   1 :  Urine Urine URINE CULTURE Clayborne Dana, MD 04/13/2017 1627        Discharge Condition (required):  Stable.  Key Physical Exam Findings at Discharge:  Physical examination is significant for:   .  General: alert and oriented x3, no apparent distress  Respiratory:  nonlabored respirations  Cardiovascular: regular rate and rhythm, well-perfused  Abdomen: soft, nontender, nondistended,   Genitourinary: no CVA tenderness, suprapubic drain in place draining clear urine,       Discharge Diet:    Diet Regular    Discharge Medications:     What To Do With Your Medications      CONTINUE taking these medications      Add'l Info   acetaminophen 325 MG tablet  Commonly known as:  TYLENOL  Take 2 tablets (650 mg) by mouth every 6 hours as needed for Mild Pain (Pain Score 1-3).   Quantity:  30 tablet  Refills:  0     docusate sodium 100 MG capsule  Commonly known as:  COLACE  Take 1 capsule (100 mg) by mouth 2 times daily.   Quantity:  60 capsule  Refills:  0     HYDROcodone-acetaminophen 5-325 MG tablet  Commonly known as:  NORCO  Take 0.5 tablets by mouth every 8 hours as needed for Severe Pain (Pain Score 7-10) or Breakthrough Pain.   Quantity:  8 tablet  Refills:  0     sulfamethoxazole-trimethoprim 800-160 MG tablet  Commonly known as:  BACTRIM DS, SEPTRA DS  Take 1 tablet by mouth 2 times daily for 2 doses. Start next week on morning of catheter removal day.  Start taking on:  04/17/2017   Quantity:  2 tablet  Refills:  0            Allergies:  Allergies   Allergen Reactions    Dutasteride Hives    Rogaine [Minoxidil] Hives       Discharge Disposition:  Home.  Discharge Code Status: Orders Placed This Encounter      Full Code - Call Code    This code status is not changed from the time of admission.    Follow Up Appointments:  Scheduled appointments:  Future Appointments   Date Time Provider [place]   04/17/2017  9:30 AM [facility]   04/23/2017  3:45 PM Kader, Madelin [facility]       For appointments requested for after discharge that have not yet been scheduled, refer to the [facility] Discharge Referrals section of the After Visit Summary.

## 2017-04-14 NOTE — Interdisciplinary (Signed)
04/14/17 1244   Initial Assessment   CM Initial Assessment Completed   Patient Information   Prior to Level of Function Ambulatory/Independent with ADL's   Assistive Device Not applicable   Primary Caretaker(s) Spouse/Partner   Primary Contact Name, Number and Relationship wife/Jacqueline [place] Employed   Financial Resources Medicare  Tree surgeon)   Discharge [place] Other   Type of Residence Apartment   Additional Services Not Applicable   Patient/Family Engaged in Discharge Planning Yes   Patient Has Decision Making Capacity Yes   Patient/Family Are In Agreement With Discharge Plan Plans to return home at discharge today.    Case manager met with the patient and confirmed home address: [address] [place] ph: [phone]. Plans to take a lyft home. Patient is independent in his care, no previous home health or DME.  Plans to discharge today with suprapubic catheter. Primary RN for teaching prior to discharge.

## 2017-04-14 NOTE — Interdisciplinary (Signed)
Pt. Ate breakfast and lunch, ambulated multiple times and still without BM. Pt. Not comfortable about discharge and will wait until after a BM. MD aware of the request.

## 2017-04-14 NOTE — Discharge Instructions (Addendum)
Urology Discharge Instructions    You may experience any of the following after an operation:   Soreness from urethra.   A sore throat if you were put to sleep and a breathing tube was placed in your throat.   Sleepiness from anesthesia or other drugs given to you prior to or during your surgery.   Fatigue just from having surgery.   Nausea occurs sometimes, but depends largely on your reaction to surgery and drugs.  These discomforts should improve rapidly and are usually gone the day following surgery.    You may speed your own recovery by the following precautions and self care:    1. Dressing and/or incision care:  Keep incisions clean and dry. OK to shower. Let soap and water run down your incision but do not scrub. No tub soaking for 4 weeks. No heavy lifting or strenuous activity for 6 weeks. Change dressing as needed around your tube. Please keep the tube well-secured.     2. Diet:   Progress to your normal diet as tolerated.     3. Activity:  Avoid driving or operating machinery while you require pain medication    4. Bathing:  You may take a shower today    5. Medications:  Your physician will advise you on what medications to take for discomfort, and provide prescriptions as needed., Take only the medications your physician has prescribed. and Avoid aspirin for pain.    6. Return Appointment:  Please contact your doctors office. Someone will call you to schedule an appointment on Thursday 2/21 with Dr. Nani Skillern. Your appointment on Wednesday 2/20 will be canceled.       7. Other Instructions:  See below    Call your doctor or come to the Emergency Department at [facility] if you have:   severe chills or fever   excessive bleeding from the site of your surgery, or if your dressing becomes soaked with blood   excessive pain, swelling, or odor at the site of your surgery   fainting, trouble breathing, or persistent dizziness   unable to urinate    Urology Contact Information:     If you have  any questions about your hospital care, your medications, or if you have new or concerning symptoms soon after going home, and you need to contact your hospital physician, your hospital physician can be contacted in the following manner:      Dr. Candi Leash RN is Lillette Boxer [phone]). His surgery scheduler is Peggyann Shoals [phone]).    Clinic appointments: [phone]    Business Hours (Monday-Friday; 08:00AM - 4:30PM; Non-Urgent):  [facility] Office phone at [phone] or [phone]  [facility] Office phone at [phone]    After-Hours, Weekends/Holidays:  FOR EMERGENCY ISSUES ONLY, call [phone] and ask them to page the on-call urologist. If it is not an emergency, then wait for business hours and call the number above.    If your clinic appointment has not been scheduled before you leave the hospital and you do not receive any call within 2 business days, call the clinic scheduler for your urologist's clinic to schedule the appointment.

## 2017-04-15 LAB — MRSA SURVEILLANCE CULTURE

## 2017-04-15 LAB — URINE CULTURE: Urine Culture Result: NO GROWTH

## 2017-04-16 ENCOUNTER — Emergency Department (HOSPITAL_BASED_OUTPATIENT_CLINIC_OR_DEPARTMENT_OTHER): Payer: Medicare Other

## 2017-04-16 ENCOUNTER — Telehealth (HOSPITAL_BASED_OUTPATIENT_CLINIC_OR_DEPARTMENT_OTHER): Payer: Self-pay | Admitting: Urology

## 2017-04-16 ENCOUNTER — Emergency Department (HOSPITAL_COMMUNITY): Payer: Medicare Other

## 2017-04-16 ENCOUNTER — Inpatient Hospital Stay (HOSPITAL_COMMUNITY): Payer: Medicare Other

## 2017-04-16 ENCOUNTER — Encounter (HOSPITAL_BASED_OUTPATIENT_CLINIC_OR_DEPARTMENT_OTHER): Payer: Self-pay

## 2017-04-16 ENCOUNTER — Inpatient Hospital Stay
Admission: EM | Admit: 2017-04-16 | Discharge: 2017-04-20 | DRG: 699 | Disposition: A | Discharge status: Home / Self Care | Payer: Medicare Other | Attending: Urology | Admitting: Urology

## 2017-04-16 DIAGNOSIS — R112 Nausea with vomiting, unspecified: Secondary | ICD-10-CM

## 2017-04-16 DIAGNOSIS — T8182XA Emphysema (subcutaneous) resulting from a procedure, initial encounter: Secondary | ICD-10-CM

## 2017-04-16 DIAGNOSIS — T797XXA Traumatic subcutaneous emphysema, initial encounter: Secondary | ICD-10-CM

## 2017-04-16 DIAGNOSIS — N323 Diverticulum of bladder: Secondary | ICD-10-CM

## 2017-04-16 DIAGNOSIS — Z4682 Encounter for fitting and adjustment of non-vascular catheter: Secondary | ICD-10-CM

## 2017-04-16 DIAGNOSIS — R197 Diarrhea, unspecified: Secondary | ICD-10-CM

## 2017-04-16 DIAGNOSIS — Z9889 Other specified postprocedural states: Secondary | ICD-10-CM

## 2017-04-16 DIAGNOSIS — R140 Abdominal distension (gaseous): Secondary | ICD-10-CM

## 2017-04-16 DIAGNOSIS — K598 Other specified functional intestinal disorders: Secondary | ICD-10-CM

## 2017-04-16 DIAGNOSIS — I723 Aneurysm of iliac artery: Secondary | ICD-10-CM

## 2017-04-16 DIAGNOSIS — K567 Ileus, unspecified: Secondary | ICD-10-CM

## 2017-04-16 LAB — LIVER PANEL, BLOOD
ALT (SGPT): 21 U/L (ref 0–41)
AST (SGOT): 21 U/L (ref 0–40)
Albumin: 3.8 g/dL (ref 3.5–5.2)
Alkaline Phos: 59 U/L (ref 40–129)
Bilirubin, Dir: 0.3 mg/dL — ABNORMAL HIGH (ref <0.2)
Bilirubin, Tot: 1.2 mg/dL — ABNORMAL HIGH (ref <1.2)
Total Protein: 7.3 g/dL (ref 6.0–8.0)

## 2017-04-16 LAB — URINALYSIS
Bilirubin: NEGATIVE
Glucose: NEGATIVE
Nitrite: POSITIVE — AB
RBC: >50 — AB (ref 0–?)
Specific Gravity: 1.026 (ref 1.002–1.030)
Urobilinogen: NEGATIVE
WBC: >50 — AB (ref 0–?)
pH: 6 (ref 5.0–8.0)

## 2017-04-16 LAB — CBC WITH DIFF, BLOOD
ANC-Automated: 4.5 10*3/uL (ref 1.6–7.0)
Abs Basophils: 0 10*3/uL (ref <0.1)
Abs Eosinophils: 0 10*3/uL (ref 0.1–0.5)
Abs Lymphs: 0.9 10*3/uL (ref 0.8–3.1)
Abs Monos: 0.8 10*3/uL (ref 0.2–0.8)
Basophils: 0 %
Eosinophils: 0 %
Hct: 44 % (ref 40.0–50.0)
Hgb: 14.7 gm/dL (ref 13.7–17.5)
Lymphocytes: 14 %
MCH: 32.6 pg — ABNORMAL HIGH (ref 26.0–32.0)
MCHC: 33.4 g/dL (ref 32.0–36.0)
MCV: 97.6 um3 — ABNORMAL HIGH (ref 79.0–95.0)
MPV: 9.6 fL (ref 9.4–12.4)
Monocytes: 13 %
Plt Count: 314 10*3/uL (ref 140–370)
RBC: 4.51 10*6/uL — ABNORMAL LOW (ref 4.60–6.10)
RDW: 14 % (ref 12.0–14.0)
Segs: 72 %
WBC: 6.2 10*3/uL (ref 4.0–10.0)

## 2017-04-16 LAB — BASIC METABOLIC PANEL, BLOOD
Anion Gap: 13 mmol/L (ref 7–15)
BUN: 34 mg/dL — ABNORMAL HIGH (ref 8–23)
Bicarbonate: 33 mmol/L — ABNORMAL HIGH (ref 22–29)
Calcium: 10.1 mg/dL (ref 8.5–10.6)
Chloride: 94 mmol/L — ABNORMAL LOW (ref 98–107)
Creatinine: 1.01 mg/dL (ref 0.67–1.17)
GFR: >60 mL/min
Glucose: 152 mg/dL — ABNORMAL HIGH (ref 70–99)
Potassium: 4 mmol/L (ref 3.5–5.1)
Sodium: 140 mmol/L (ref 136–145)

## 2017-04-16 LAB — LIPASE, BLOOD: Lipase: 114 U/L — ABNORMAL HIGH (ref 13–60)

## 2017-04-16 LAB — MAGNESIUM, BLOOD: Magnesium: 2.4 mg/dL (ref 1.6–2.4)

## 2017-04-16 LAB — GLUCOSE (POCT): Glucose (POCT): 137 mg/dL — ABNORMAL HIGH (ref 70–99)

## 2017-04-16 MED ORDER — METOCLOPRAMIDE HCL 5 MG/ML IJ SOLN
5.0000 mg | Freq: Once | INTRAMUSCULAR | Status: AC
Start: 2017-04-16 — End: 2017-04-16
  Administered 2017-04-16: 5 mg via INTRAVENOUS
  Filled 2017-04-16: qty 2

## 2017-04-16 MED ORDER — ONDANSETRON HCL 4 MG/2ML IV SOLN
4.0000 mg | Freq: Four times a day (QID) | INTRAMUSCULAR | Status: DC | PRN
Start: 2017-04-16 — End: 2017-04-16
  Administered 2017-04-16: 4 mg via INTRAVENOUS
  Filled 2017-04-16: qty 2

## 2017-04-16 MED ORDER — BISACODYL 10 MG RE SUPP
10.0000 mg | Freq: Every day | RECTAL | Status: DC
Start: 2017-04-17 — End: 2017-04-16

## 2017-04-16 MED ORDER — PROCHLORPERAZINE EDISYLATE 5 MG/ML IJ SOLN
10.0000 mg | Freq: Four times a day (QID) | INTRAMUSCULAR | Status: DC | PRN
Start: 2017-04-16 — End: 2017-04-20
  Administered 2017-04-17 (×2): 10 mg via INTRAVENOUS
  Filled 2017-04-16 (×2): qty 2

## 2017-04-16 MED ORDER — MORPHINE SULFATE 2 MG/ML IJ SOLN
2.0000 mg | INTRAMUSCULAR | Status: AC | PRN
Start: 2017-04-16 — End: 2017-04-17
  Administered 2017-04-16 (×2): 2 mg via INTRAVENOUS
  Filled 2017-04-16 (×2): qty 1

## 2017-04-16 MED ORDER — MORPHINE SULFATE 4 MG/ML IJ SOLN
2.0000 mg | INTRAMUSCULAR | Status: DC | PRN
Start: 2017-04-16 — End: 2017-04-16
  Administered 2017-04-16: 2 mg via INTRAVENOUS
  Filled 2017-04-16: qty 1

## 2017-04-16 MED ORDER — DIPHENHYDRAMINE HCL 50 MG/ML IJ SOLN
25.0000 mg | Freq: Once | INTRAMUSCULAR | Status: AC
Start: 2017-04-16 — End: 2017-04-16
  Administered 2017-04-16: 25 mg via INTRAVENOUS
  Filled 2017-04-16: qty 50

## 2017-04-16 MED ORDER — SENNA 8.6 MG OR TABS
2.0000 | ORAL_TABLET | Freq: Every evening | ORAL | Status: DC
Start: 2017-04-16 — End: 2017-04-20
  Administered 2017-04-17 – 2017-04-18 (×2): 17.2 mg via ORAL
  Filled 2017-04-16 (×3): qty 2

## 2017-04-16 MED ORDER — ONDANSETRON HCL 4 MG/2ML IV SOLN
INTRAMUSCULAR | Status: AC
Start: 2017-04-16 — End: 2017-04-16
  Administered 2017-04-16: 4 mg via INTRAVENOUS
  Filled 2017-04-16: qty 2

## 2017-04-16 MED ORDER — METOCLOPRAMIDE HCL 5 MG/ML IJ SOLN
5.0000 mg | Freq: Four times a day (QID) | INTRAMUSCULAR | Status: DC
Start: 2017-04-16 — End: 2017-04-20
  Administered 2017-04-16 – 2017-04-19 (×11): 5 mg via INTRAVENOUS
  Filled 2017-04-16 (×14): qty 2

## 2017-04-16 MED ORDER — SODIUM CHLORIDE 0.9 % IV SOLN
INTRAVENOUS | Status: DC
Start: 2017-04-16 — End: 2017-04-17
  Administered 2017-04-16 – 2017-04-17 (×2): via INTRAVENOUS

## 2017-04-16 MED ORDER — HYDROMORPHONE HCL 1 MG/ML IJ SOLN
0.5000 mg | Freq: Once | INTRAMUSCULAR | Status: AC
Start: 2017-04-16 — End: 2017-04-16
  Administered 2017-04-16: 0.5 mg via INTRAVENOUS
  Filled 2017-04-16: qty 0.5

## 2017-04-16 MED ORDER — LACTATED RINGERS IV SOLN
Freq: Once | INTRAVENOUS | Status: AC
Start: 2017-04-16 — End: 2017-04-16
  Administered 2017-04-16: 08:00:00 via INTRAVENOUS

## 2017-04-16 MED ORDER — MORPHINE SULFATE 2 MG/ML IJ SOLN
2.0000 mg | INTRAMUSCULAR | Status: AC | PRN
Start: 2017-04-16 — End: 2017-04-17

## 2017-04-16 MED ORDER — ONDANSETRON HCL 4 MG/2ML IV SOLN
4.0000 mg | Freq: Once | INTRAMUSCULAR | Status: AC
Start: 2017-04-16 — End: 2017-04-16
  Administered 2017-04-16: 4 mg via INTRAVENOUS
  Filled 2017-04-16: qty 2

## 2017-04-16 MED ORDER — PHENOL 1.4 % MT LIQD
3.0000 | OROMUCOSAL | Status: DC | PRN
Start: 2017-04-16 — End: 2017-04-20
  Administered 2017-04-18: 3 via OROMUCOSAL
  Filled 2017-04-16: qty 177

## 2017-04-16 MED ORDER — IOHEXOL 350 MG/ML IV SOLN
100.0000 mL | Freq: Once | INTRAVENOUS | Status: AC
Start: 2017-04-16 — End: 2017-04-16
  Administered 2017-04-16: 100 mL via INTRAVENOUS
  Filled 2017-04-16: qty 100

## 2017-04-16 MED ORDER — NALOXONE HCL 0.4 MG/ML IJ SOLN
0.1000 mg | INTRAMUSCULAR | Status: DC | PRN
Start: 2017-04-16 — End: 2017-04-20

## 2017-04-16 MED ORDER — MORPHINE SULFATE 4 MG/ML IJ SOLN
4.0000 mg | INTRAMUSCULAR | Status: AC | PRN
Start: 2017-04-16 — End: 2017-04-17
  Administered 2017-04-17 (×3): 4 mg via INTRAVENOUS
  Filled 2017-04-16 (×2): qty 1

## 2017-04-16 MED ORDER — SODIUM CHLORIDE 0.9 % IV SOLN
Freq: Once | INTRAVENOUS | Status: AC
Start: 2017-04-16 — End: 2017-04-16
  Administered 2017-04-16: 06:00:00 via INTRAVENOUS

## 2017-04-16 MED ORDER — ONDANSETRON HCL 4 MG/2ML IV SOLN
4.0000 mg | Freq: Once | INTRAMUSCULAR | Status: AC
Start: 2017-04-16 — End: 2017-04-16
  Administered 2017-04-16: 4 mg via INTRAVENOUS

## 2017-04-16 NOTE — ED Notes (Signed)
Pt tolerated ng tube.   Placement verified when tube placed on wall suction, water and then gastric content(greenish liquid came out).   Placed suction on low, intermittent. Ts.

## 2017-04-16 NOTE — Telephone Encounter (Signed)
Left msg for pt at [phone] - advised I received a msg from Dr. Michaelene Song to schedule follow up. I have scheduled him on Thurs 02/21 at 2:30 PM. Request call back to confirm at [phone].

## 2017-04-16 NOTE — ED Notes (Signed)
Patient to xray.

## 2017-04-16 NOTE — ED Notes (Signed)
Pt taken and returns from ct scan. Ts.

## 2017-04-16 NOTE — ED Floor Report (Signed)
ED to IP Handoff    Report created by Margette Fast, RN at 2:13 PM 04/16/2017.     HANDOFF REPORT UPDATE/CHANGES (changes in patient status/care/events prior to transfer)  By who:  Time:   Additional information:                                                                                                                                                     Pedro Shannon is a 67 year old male.    Brief Summary of ED Visit (to include focused assessment and neuro status): 19 y m w hx of abd surgeries w injury to bladder, having suprapubic cath w decreased output w nv.    RN shift assessment exceptions to WDL: pt awake and alert x 3.    Any significant events and interventions with responses:  Pt able to ambulate s assist. Pt having swelling to abd. Pt had suprapubic cath irrigated by urology. Cath is draining. Pt c/o nv and abd swelling. Pt had abd ct done. Pt diagnosed w ileus.   Pt givne 18 fr ng tube. Tube draining. Output 887mls. Pt continues to sip water, despite being instructed not to. Pt has iv fluid on pump at 125 mls/hr. Pt states feeling better and abd is less swollen. Ts.     Radiologic studies not completed: none  (None unless otherwise noted)    Chief Complaint   Patient presents with    Urinary Catheter Problem     Patient recently admitted for multiple problems. Had super pubic catheter placed. Now having decreased output from catheter. Has been having multiple episodes of vomiting and diarrhea.       Admitted for: ileus    Code Status:  Please refer to In-pt admitting doctors orders     Level of Care: med/surg     Is patient septic? no If yes, complete below:    BC x 2 drawn? no  If No explain:  Not ordered.    Repeat lactate needed? no  If Yes, when is it due?  Not ordered.    All initial antibiotics given?  no  If No, explain:  Not ordered.    Amount of IV fluids received 1000 ml    Is patient on Heparin? no If yes, complete below:     Time Heparin bolus was given: none    Additional  drips patient is on: nacl    Cardiac rhythm: sr    Oxygen Delivery: None    Past Medical History:   Diagnosis Date    Prostate cancer (CMS-HCC)        Past Surgical History:   Procedure Laterality Date    NASAL SEPTOPLASTY      R inguinal hernia      SUPRAPUBIC TUBE PLACEMENT         Allergies:  Dutasteride and Rogaine [minoxidil]    ED Fall Risk: No    Skin issues:  no    >> If yes, note areas of skin breakdown. See appropriate photos.      Ambulatory:  yes    Sitter needed: no    Suicide Risk:  no    Isolation Required: unknown     >> If yes , what type of isolation: unknown    Is patient in custody?  no    Is patient in restraints? no    Vitals:    04/16/17 0526 04/16/17 0600 04/16/17 1001 04/16/17 1246   BP: 109/67  123/61 127/65   BP Location:   Left arm    BP Patient Position:   Sitting    Pulse: 97  92 99   Resp: 20  16 18    Temp: 98.2 F (36.8 C)  98.1 F (36.7 C) 98.3 F (36.8 C)   SpO2: 98% 93% 95% 95%   Weight: 90.3 kg (199 lb 1.2 oz)      Height: 6' (1.829 m)               Lab Results   Component Value Date    WBC 6.2 04/16/2017    RBC 4.51 (L) 04/16/2017    HGB 14.7 04/16/2017    HCT 44.0 04/16/2017    MCV 97.6 (H) 04/16/2017    MCHC 33.4 04/16/2017    RDW 14.0 04/16/2017    PLT 314 04/16/2017    MPV 9.6 04/16/2017       Lab Results   Component Value Date    NA 140 04/16/2017    K 4.0 04/16/2017    CL 94 (L) 04/16/2017    BICARB 33 (H) 04/16/2017    BUN 34 (H) 04/16/2017    CREAT 1.01 04/16/2017    GLU 152 (H) 04/16/2017    [place] 10.1 04/16/2017       Lab Results   Component Value Date    MG 2.4 04/16/2017       No results found for: CPK, CKMBH, TROPONIN    No results found for: PH, PCO2, O2CONTENT, IVHC3, IVBE, O2SAT, UNPH, UNPCO2, ARTPH, ARTPCO2, ARTO2CNT, IAHC3, IABE, ARTO2SAT, UNAPH, UNAPCO2    No results found for this visit on 04/16/17.      Patient Lines/Drains/Airways Status    Active PICC Line / CVC Line / PIV Line / Drain / Airway / Intraosseous Line / Epidural Line / ART Line / Line Type /  Wound     Name: Placement date: Placement time: Site: Days:    Peripheral IV - 20 G Right Antecubital  04/16/17   0546   Antecubital  less than 1    NG/OG Tube -  Nasogastric 18 fr Right nostril  04/16/17   1245   Right nostril  less than 1    Suprapubic Catheter -   04/13/17   1900   --  2                    ED Handoff Report is ready for review.  Admitting RN may reach Emergency Department RN, Margette Fast, RN, at [phone] with any questions.

## 2017-04-16 NOTE — H&P (Signed)
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    The day of discharge he was feeling well, tolerating a diet, and had a bowel movement. Then at home he experienced nausea, vomiting, and diarrhea. Diarrhea improved and able to tolerate some PO but with continued distention and nausea. He is still passing gas and is experiencing hiccups.     He denies fevers or chills. Denies CP or SOB.    Catheter draining well. No hematuria.      Lines, Drains, and Airways:  Patient Lines/Drains/Airways Status    Active PICC Line / CVC Line / PIV Line / Drain / Airway / Intraosseous Line / Epidural Line / ART Line / Line Type / Wound     Name: Placement date: Placement time: Site: Days:    Peripheral IV - 20 G Right Antecubital  04/16/17   0546   Antecubital  less than 1    Suprapubic Catheter -   04/13/17   1900   --  2                Drips:    sodium chloride 125 mL/hr at 04/16/17 1205       Abx:  Current Facility-Administered Medications   Medication Dose Frequency       Past Medical and Surgical History:  Past Medical History:   Diagnosis Date    Prostate cancer (CMS-HCC)      Past Surgical History:   Procedure Laterality Date    NASAL SEPTOPLASTY      R inguinal hernia      SUPRAPUBIC TUBE PLACEMENT         Allergies:  Allergies   Allergen Reactions    Dutasteride Hives    Rogaine [Minoxidil] Hives       Medications:  Current Facility-Administered Medications   Medication    sodium chloride 0.9% infusion     Current Outpatient Medications   Medication Sig    acetaminophen (TYLENOL) 325 MG tablet Take 2 tablets (650 mg) by mouth every 6 hours as needed for Mild Pain (Pain  Score 1-3).    docusate sodium (COLACE) 100 MG capsule Take 1 capsule (100 mg) by mouth 2 times daily.    HYDROcodone-acetaminophen (NORCO) 5-325 MG tablet Take 0.5 tablets by mouth every 8 hours as needed for Severe Pain (Pain Score 7-10) or Breakthrough Pain.    [START ON 04/17/2017] sulfamethoxazole-trimethoprim (BACTRIM DS, SEPTRA DS) 800-160 MG tablet Take 1 tablet by mouth 2 times daily for 2 doses. Start next week on morning of catheter removal day.       Social History:  Social History     Socioeconomic History    Marital status: Married     Spouse name: Not on file    Number of children: Not on file    Years of education: Not on file    Highest education level: Not on file   Occupational History    Occupation: Lawyer   Tobacco Use    Smoking status: Never Smoker    Smokeless tobacco: Never Used   Substance and Sexual Activity  Alcohol use: No    Drug use: No    Sexual activity: Not on file   Social Activities of Daily Living Present    Not on file   Social History Narrative    Not on file       Family History:  Family History   Problem Relation Name Age of Onset    Lung Cancer Mother      Melanoma Cancer Mother      Other Father          esophageal cancer       Review of Systems:  Negative except per HPI    Diet:  No diet orders on file    Physical Exam:  BP 109/67    Pulse 97    Temp 98.2 F (36.8 C)    Resp 20    Ht 6' (1.829 m)    Wt 90.3 kg (199 lb 1.2 oz)    SpO2 93%    BMI 27.00 kg/m   NAD, Conversant  Pleasant  Unlabored on RA  WWP  Abd soft, distended, tympanitic to percussion. Incisions c/d/i with bruising.   SPT in place draining CYU  No CVA tenderness  No edema BLE    Labs and Other Data:  Lab Results   Component Value Date    NA 140 04/16/2017    K 4.0 04/16/2017    CL 94 (L) 04/16/2017    BICARB 33 (H) 04/16/2017    BUN 34 (H) 04/16/2017    CREAT 1.01 04/16/2017    GLU 152 (H) 04/16/2017    [place] 10.1 04/16/2017     Lab Results   Component Value Date    WBC 6.2 04/16/2017     HGB 14.7 04/16/2017    HCT 44.0 04/16/2017    PLT 314 04/16/2017    SEG 72 04/16/2017    LYMPHS 14 04/16/2017    MONOS 13 04/16/2017    EOS 0 04/16/2017     Lab Results   Component Value Date    AST 21 04/16/2017    ALT 21 04/16/2017    ALK 59 04/16/2017    TBILI 1.20 (H) 04/16/2017    DBILI 0.3 (H) 04/16/2017    TP 7.3 04/16/2017    ALB 3.8 04/16/2017     No results found for: INR, PTT  Lab Results   Component Value Date    PHUA 6.0 04/16/2017    SGUA 1.026 04/16/2017    GLUCOSEUA Negative 04/16/2017    KETONEUA 1+ (A) 04/16/2017    BLOODUA 3+ (A) 04/16/2017    PROTEINUA 2+ (A) 04/16/2017    LEUKESTUA 2+ (A) 04/16/2017    NITRITEUA Positive (A) 04/16/2017    WBCUA >50 (A) 04/16/2017    RBCUA >50 (A) 04/16/2017       Imaging:  KUB reviewed: IMPRESSION:  1. Multiple gas-filled dilated loops of small bowel, with paucity of colonic bowel gas, suggesting obstruction versus ileus.  2. No pneumoperitoneum.  3. Extensive subcutaneous emphysema, similar to prior CT of 04/13/2017.  4. No acute cardiopulmonary disease.    Assessment and Recommendations:  Pedro Shannon is a 67 year old year old male with a history of Gl 3+3 PCA on AS, BPH s/p REZUM (06/2016), and bladder diverticula s/p robotic diverticulectomy on 04/10/17 c/b difficult foley and misplaced foley catheter, UR and bladder perforation s/p IR placed SPT on POD1.    CT demonstrates dilated loops of small bowel, no obvious transition point.  Likely ileus s/p temporary urine extravasation in the setting of prior perforation.    1) NPO  2) NGT  3) Admit to Urology  4) IVF 100NS  5) Will monitor clinically      Note Author: Tyron Russell, 04/16/17, 7:34 AM  Discussed with Dr. Nani Skillern

## 2017-04-16 NOTE — ED Notes (Signed)
97fr ng tube placed. Ts.

## 2017-04-16 NOTE — Plan of Care (Signed)
Problem: Promotion of Health and Safety  Goal: Promotion of Health and Safety  The patient remains safe, receives appropriate treatment and achieves optimal outcomes (physically, psychosocially, and spiritually) within the limitations of the disease process by discharge.    Information below is the current care plan.   04/16/17 1734 04/16/17 1850   Adult/Peds Plan of Care   Guidelines --  Inpatient Nursing Guidelines   Individualized Interventions/Recommendations #1 --  maintain NGT to low intermitent wall suction   Individualized Interventions/Recommendations #2 (if applicable) --  suprapubic foley cath to gravity   Patient /Family stated Goal   Patient /Family stated Goal pain control --

## 2017-04-16 NOTE — EMS Narrative (Addendum)
Pt Age: 67 Years; Gender: Male;  Primary Impression: Abdominal Pain/Problems;  Medical History: ; Pt Medication: ; Pt Allergies: (No Known Drug   Allergies), ;  Base Called: [place]    CC:    HPI:  Primary Symptom: Nausea  Provider's Primary Impression: Generalized abdominal pain  Initial Patient Acuity: Lower Acuity Nyoka Cowden)    Alert:  Patient Care Report Number: [id number]  Incident Number: [id number]  EMS Vehicle (Unit) Number: [id number]  EMS Unit Call Sign: [id number]  Level of Care of This Unit: ALS-Paramedic  Incident Location Type: Unsp non-institutional (private) residence as place  Incident Street Address: [place]: [id number]  Incident ZIP Code: [phone]    Assessment:    Procedure - Arrest:  Cardiac Arrest: No    Procedure - Exam:    Procedure - Injury:    Procedure - Airway:    Procedure - Medications:    Procedure - Generic:    Demographics History:    Demographics Practitioner:    Demographics Patient:  Last Name: Kenton Shannon  First Name: Pedro  Middle Initial/Name: Tayne  Patient's Home Address: [address], [place]: [id number]  [place]: [phone]  Patient's Home State: 06  Patient's Home ZIP Code: [place] of Residence: [place]  Gender: Male  Race: White  Age: 64  Age Units: Years    Demographics Times:  Unit Notified by Dispatch Date/Time: 2019-02-19T04:42:43-08:00  Unit En Route Date/Time: 2019-02-19T04:43:14-08:00  Unit Arrived on Scene Date/Time: 2019-02-19T04:49:23-08:00  Arrived at Patient Date/Time: 2019-02-19T05:02:00-08:00  Unit Left Scene Date/Time: 2019-02-19T04:59:04-08:00  Patient Arrived at Destination Date/Time: 2019-02-19T05:14:10-08:00    Demographics Payment:

## 2017-04-16 NOTE — ED Notes (Signed)
Accessed pt's suprapubic cath drainage tubing w port.   Cleansed area w alcohol wipe and pulled urine specimen.   Specimen sent to lab. Ts.

## 2017-04-16 NOTE — ED Notes (Signed)
Patient is alert and oriented x 4. He states he has had multiple surgeries that have been complicated resulting in placement of suprapubic catheter. He was brought in by medics for multiple episodes of vomiting along with increased abdominal distention and decreased output from catheter. He has bloody drainage in his urine bag with some clots noted. Abdomen is distended. Denies pain. + bowel sounds. He vomited large amount of green emesis x 2. He also report having multiple episodes of diarrhea while at home. None noted at this time. He has some bruising to abdomen. Mucus membranes are dry. Denies chest pain or trouble breathing

## 2017-04-16 NOTE — ED Notes (Signed)
Received report from brendan rn. Ts.

## 2017-04-16 NOTE — ED Notes (Signed)
Pt reevaluated by dr Alla Feeling. Ts.

## 2017-04-16 NOTE — Consults (Signed)
UROLOGY CONSULT    Reason for consult:  Nausea/vomiting    History of Present Illness:     [name] is a 67 year old year old male with a history of Gl 3+3 PCA on AS, BPH s/p REZUM (06/2016), and bladder diverticula s/p robotic diverticulectomy on 04/10/17 c/b difficult foley and misplaced foley catheter, UR and bladder perforation at the site of his repair s/p IR placed SPT on POD1.    The day of discharge he was feeling well, tolerating a diet, and had a bowel movement. Then at home he experienced nausea, vomiting, and diarrhea. Diarrhea improved and able to tolerate some PO but with continued distention and nausea. He is still passing gas and is experiencing hiccups.     He denies fevers or chills. Denies CP or SOB.    Catheter draining well. No hematuria.      Lines, Drains, and Airways:  Patient Lines/Drains/Airways Status    Active PICC Line / CVC Line / PIV Line / Drain / Airway / Intraosseous Line / Epidural Line / ART Line / Line Type / Wound     Name: Placement date: Placement time: Site: Days:    Peripheral IV - 20 G Right Antecubital  04/16/17   0546   Antecubital  less than 1    Suprapubic Catheter -   04/13/17   1900   --  2                Drips:    bolus IV fluid         Abx:  Current Facility-Administered Medications   Medication Dose Frequency       Past Medical and Surgical History:  Past Medical History:   Diagnosis Date    Prostate cancer (CMS-HCC)      Past Surgical History:   Procedure Laterality Date    NASAL SEPTOPLASTY      R inguinal hernia      SUPRAPUBIC TUBE PLACEMENT         Allergies:  Allergies   Allergen Reactions    Dutasteride Hives    Rogaine [Minoxidil] Hives       Medications:  Current Facility-Administered Medications   Medication    lactated ringers 1,000 mL IV bolus     Current Outpatient Medications   Medication Sig    acetaminophen (TYLENOL) 325 MG tablet Take 2 tablets (650 mg) by mouth every 6 hours as needed for Mild Pain (Pain Score 1-3).     docusate sodium (COLACE) 100 MG capsule Take 1 capsule (100 mg) by mouth 2 times daily.    HYDROcodone-acetaminophen (NORCO) 5-325 MG tablet Take 0.5 tablets by mouth every 8 hours as needed for Severe Pain (Pain Score 7-10) or Breakthrough Pain.    [START ON 04/17/2017] sulfamethoxazole-trimethoprim (BACTRIM DS, SEPTRA DS) 800-160 MG tablet Take 1 tablet by mouth 2 times daily for 2 doses. Start next week on morning of catheter removal day.       Social History:  Social History     Socioeconomic History    Marital status: Married     Spouse name: Not on file    Number of children: Not on file    Years of education: Not on file    Highest education level: Not on file   Occupational History    Occupation: Lawyer   Tobacco Use    Smoking status: Never Smoker    Smokeless tobacco: Never Used   Substance and Sexual Activity  Alcohol use: No    Drug use: No    Sexual activity: Not on file   Social Activities of Daily Living Present    Not on file   Social History Narrative    Not on file       Family History:  Family History   Problem Relation Name Age of Onset    Lung Cancer Mother      Melanoma Cancer Mother      Other Father          esophageal cancer       Review of Systems:  Negative except per HPI    Diet:  No diet orders on file    Physical Exam:  BP 109/67    Pulse 97    Temp 98.2 F (36.8 C)    Resp 20    Ht 6' (1.829 m)    Wt 90.3 kg (199 lb 1.2 oz)    SpO2 93%    BMI 27.00 kg/m   NAD, Conversant  Pleasant  Unlabored on RA  WWP  Abd soft, distended, tympanitic to percussion. Incisions c/d/i with bruising.   SPT in place draining CYU  No CVA tenderness  No edema BLE    Labs and Other Data:  Lab Results   Component Value Date    NA 140 04/16/2017    K 4.0 04/16/2017    CL 94 (L) 04/16/2017    BICARB 33 (H) 04/16/2017    BUN 34 (H) 04/16/2017    CREAT 1.01 04/16/2017    GLU 152 (H) 04/16/2017     10.1 04/16/2017     Lab Results   Component Value Date    WBC 6.2 04/16/2017    HGB 14.7  04/16/2017    HCT 44.0 04/16/2017    PLT 314 04/16/2017    SEG 72 04/16/2017    LYMPHS 14 04/16/2017    MONOS 13 04/16/2017    EOS 0 04/16/2017     Lab Results   Component Value Date    AST 21 04/16/2017    ALT 21 04/16/2017    ALK 59 04/16/2017    TBILI 1.20 (H) 04/16/2017    DBILI 0.3 (H) 04/16/2017    TP 7.3 04/16/2017    ALB 3.8 04/16/2017     No results found for: INR, PTT  No results found for: PHUA, [place], [place], KETONEUA, BLOODUA, [place], LEUKESTUA, [place], [place], RBCUA, EPITHCELLSUA, CRYSTALSUA, COMMENTSUA    Imaging:  KUB reviewed: IMPRESSION:  1. Multiple gas-filled dilated loops of small bowel, with paucity of colonic bowel gas, suggesting obstruction versus ileus.  2. No pneumoperitoneum.  3. Extensive subcutaneous emphysema, similar to prior CT of 04/13/2017.  4. No acute cardiopulmonary disease.    Assessment and Recommendations:  [name] is a 67 year old year old male with a history of Gl 3+3 PCA on AS, BPH s/p REZUM (06/2016), and bladder diverticula s/p robotic diverticulectomy on 04/10/17 c/b difficult foley and misplaced foley catheter, UR and bladder perforation s/p IR placed SPT on POD1.    CT demonstrates dilated loops of small bowel, no obvious transition point.    Likely ileus s/p temporary urine extravasation in the setting of prior perforation.    1) NPO  2) NGT  3) Admit to Urology  4) IVF 100NS  5) Will monitor clinically      Note Author: Tyron Russell, 04/16/17, 7:34 AM  Discussed with Dr. Nani Skillern

## 2017-04-16 NOTE — Telephone Encounter (Signed)
-----   Message from [place] sent at 04/16/2017 10:28 AM PST -----  Regarding: FW: appt Kader      ----- Message -----  From: Lilyan Gilford, MD  Sent: 04/14/2017  10:12 AM  To: [facility] Front Desk Staff  Subject: appt Kader                                       Hello, please schedule an appointment with Dr. Nani Skillern this Thursday, ok to overbook. Thanks, [employer]

## 2017-04-16 NOTE — ED Notes (Signed)
Pt's suction cannister was full.   The settings are on low intermittent suctioning.   Pt had been drinking water.   Pt states that he feels better w drinking small sips of water.   Pt had been informed not to drink anything, he has iv fluid running at 125 mls/hr.

## 2017-04-16 NOTE — ED Provider Notes (Signed)
CC: vomiting and diarrhea    67 year old recently s/p robotic diverticulectomy 04/10/17 with dysfunction catheter, intraperitoneal bladder rupture, peritoneal absorption of Cr admitted 2/16-2/17 in with nausea vomiting and diarrhea.  Prior to discharge had a suppository placed and had a small bowel movement prior to discharge. When he got home he started having nausea vomiting and diarrhea.  He reports about 25 episodes of both vomiting and diarrhea since discharge. His diarrhea and nausea and vomiting started to improve last night knee was able to take a small amount on a fluid food however reports increased abdominal distension, nausea, persistent vomiting and hiccups.  He is also reporting clots in his catheter and feels not draining as much as usual.  He reports about half of the urine is coming out as prior.  He is not having any fevers.  He is not having any chest pain or shortness of breath.  His only other surgery is repair of her hernia.      Past Medical History:   Diagnosis Date    Prostate cancer (CMS-HCC)      Past Surgical History:   Procedure Laterality Date    NASAL SEPTOPLASTY      R inguinal hernia      SUPRAPUBIC TUBE PLACEMENT       Current Facility-Administered Medications   Medication    sodium chloride 0.9 % 1,000 mL IV bolus     Current Outpatient Medications   Medication Sig    acetaminophen (TYLENOL) 325 MG tablet Take 2 tablets (650 mg) by mouth every 6 hours as needed for Mild Pain (Pain Score 1-3).    docusate sodium (COLACE) 100 MG capsule Take 1 capsule (100 mg) by mouth 2 times daily.    HYDROcodone-acetaminophen (NORCO) 5-325 MG tablet Take 0.5 tablets by mouth every 8 hours as needed for Severe Pain (Pain Score 7-10) or Breakthrough Pain.    [START ON 04/17/2017] sulfamethoxazole-trimethoprim (BACTRIM DS, SEPTRA DS) 800-160 MG tablet Take 1 tablet by mouth 2 times daily for 2 doses. Start next week on morning of catheter removal day.     Social History     Tobacco Use     Smoking status: Never Smoker    Smokeless tobacco: Never Used   Substance Use Topics    Alcohol use: No    Drug use: No     Family History   Problem Relation Name Age of Onset    Lung Cancer Mother      Melanoma Cancer Mother      Other Father          esophageal cancer     ROS- aside from above reviewed items, all systems reviewed and are negative.    Exam  BP 109/67    Pulse 97    Temp 98.2 F (36.8 C)    Resp 20    Ht 6' (1.829 m)    Wt 90.3 kg (199 lb 1.2 oz)    SpO2 98%    BMI 27.00 kg/m   Vital signs interpretation: nl vitals  Head: nontoxic appearing male in nad, eomi, o/p dry  Neck: not stiff  CV: rrr -m  Lungs: cta  ABD: soft, distended, incisions clean, no e/o infection, suprapubic catheter with dark yellow urine  Back: no cva ttp  Ext: no asymmetry  Neuro: nonfocal     Labs/Studies:     Labs Reviewed   GLUCOSE (POCT) - Abnormal; Notable for the following components:  Result Value    Glucose (POCT) 137 (*)     All other components within normal limits   BASIC METABOLIC PANEL, BLOOD   LIVER PANEL, BLOOD   MAGNESIUM, BLOOD   LIPASE, BLOOD   CBC WITH DIFF, BLOOD   URINALYSIS         Impression:  67 yo male with history as above presenting with vomiting/diarrhea, now with vomiting and abdominal distension, recent history as above, symptoms concerning for ileus vs obstruction  Plan for ivf, labs, antiemetics prn, pain control, CT abd/pelvis       Alphia Behanna, Kyung Rudd, MD  36/14/43 2005

## 2017-04-16 NOTE — ED MD Progress Note (Signed)
x

## 2017-04-16 NOTE — Plan of Care (Signed)
Call placed to on call HO regarding patient requesting pain medication and parameter for NGT and still waiting for returning call.

## 2017-04-16 NOTE — ED Notes (Signed)
Spoke w ann on [place] her that floor report is in.   Will bring pt up in 1636 hrs. Ts.

## 2017-04-17 ENCOUNTER — Encounter (INDEPENDENT_AMBULATORY_CARE_PROVIDER_SITE_OTHER): Payer: Self-pay | Admitting: Physician Assistant

## 2017-04-17 ENCOUNTER — Telehealth (HOSPITAL_BASED_OUTPATIENT_CLINIC_OR_DEPARTMENT_OTHER): Payer: Self-pay

## 2017-04-17 ENCOUNTER — Ambulatory Visit (HOSPITAL_BASED_OUTPATIENT_CLINIC_OR_DEPARTMENT_OTHER): Payer: Medicare Other

## 2017-04-17 DIAGNOSIS — K567 Ileus, unspecified: Secondary | ICD-10-CM

## 2017-04-17 DIAGNOSIS — N323 Diverticulum of bladder: Principal | ICD-10-CM

## 2017-04-17 LAB — CBC WITH DIFF, BLOOD
ANC-Manual Mode: 6.3 10*3/uL (ref 1.6–7.0)
Abs Basophils: 0 10*3/uL (ref <0.1)
Abs Eosinophils: 0.1 10*3/uL (ref 0.1–0.5)
Abs Lymphs: 0.9 10*3/uL (ref 0.8–3.1)
Abs Monos: 0.8 10*3/uL (ref 0.2–0.8)
Basophils: 0 %
Eosinophils: 1 %
Hct: 38.8 % — ABNORMAL LOW (ref 40.0–50.0)
Hgb: 12.9 gm/dL — ABNORMAL LOW (ref 13.7–17.5)
Lymphocytes: 11 %
MCH: 32.6 pg — ABNORMAL HIGH (ref 26.0–32.0)
MCHC: 33.2 g/dL (ref 32.0–36.0)
MCV: 98 um3 — ABNORMAL HIGH (ref 79.0–95.0)
MPV: 9.8 fL (ref 9.4–12.4)
Monocytes: 10 %
Plt Count: 277 10*3/uL (ref 140–370)
RBC: 3.96 10*6/uL — ABNORMAL LOW (ref 4.60–6.10)
RDW: 14 % (ref 12.0–14.0)
Segs: 41 %
WBC: 8.2 10*3/uL (ref 4.0–10.0)

## 2017-04-17 LAB — MDIFF
Bands: 36 % — ABNORMAL HIGH (ref 0–15)
Immature Granulocytes Absolute Manual: 0.1 10*3/uL (ref 0.0–0.1)
Myelocytes: 1 %
Number of Cells Counted: 115
Plt Est: ADEQUATE
RBC Comment: NORMAL

## 2017-04-17 LAB — BASIC METABOLIC PANEL, BLOOD
Anion Gap: 10 mmol/L (ref 7–15)
BUN: 23 mg/dL (ref 8–23)
Bicarbonate: 31 mmol/L — ABNORMAL HIGH (ref 22–29)
Calcium: 8 mg/dL — ABNORMAL LOW (ref 8.5–10.6)
Chloride: 104 mmol/L (ref 98–107)
Creatinine: 0.84 mg/dL (ref 0.67–1.17)
GFR: >60 mL/min
Glucose: 104 mg/dL — ABNORMAL HIGH (ref 70–99)
Potassium: 3.6 mmol/L (ref 3.5–5.1)
Sodium: 145 mmol/L (ref 136–145)

## 2017-04-17 LAB — PHOSPHORUS, BLOOD: Phosphorous: 3.8 mg/dL (ref 2.7–4.5)

## 2017-04-17 LAB — MAGNESIUM, BLOOD: Magnesium: 2 mg/dL (ref 1.6–2.4)

## 2017-04-17 MED ORDER — DEXTROSE-KCL-NACL 5-20-0.45 %-MEQ/L-% IV SOLN
INTRAVENOUS | Status: DC
Start: 2017-04-17 — End: 2017-04-20
  Administered 2017-04-17: 17:00:00 via INTRAVENOUS
  Administered 2017-04-19: 1000 mL via INTRAVENOUS
  Administered 2017-04-19: 16:00:00 via INTRAVENOUS

## 2017-04-17 NOTE — Telephone Encounter (Signed)
Transitional Telephonic Nurse ( TTN)  Patient Unreached via Post-Discharge calls    Per discharge summary pt hospitalized s/p urinary retention; abdominal pain; underwent IR CT pelvic drain for urinary retention  TN called spoke with pt; he has been readmitted to [facility]    Per review of Epic pt was in ED 04/13/17 for urinary retention, was discharged, readmitted 04/16/17 [place] [facility] [place]; TN spoke with pt, he has been readmitted.      Dana Allan, RN MSN, [facility]  Telephonic Nurse    [place]  Transitional Telephonic Nursing  [place] [address]  T: [phone]  C :[phone]  [email or web address]  [email or web address]

## 2017-04-17 NOTE — Plan of Care (Signed)
Problem: Promotion of Health and Safety  Goal: Promotion of Health and Safety  The patient remains safe, receives appropriate treatment and achieves optimal outcomes (physically, psychosocially, and spiritually) within the limitations of the disease process by discharge.    Information below is the current care plan.  Outcome: Progressing

## 2017-04-17 NOTE — Plan of Care (Signed)
Problem: Promotion of Health and Safety  Goal: Promotion of Health and Safety  The patient remains safe, receives appropriate treatment and achieves optimal outcomes (physically, psychosocially, and spiritually) within the limitations of the disease process by discharge.    Information below is the current care plan.   04/16/17 1957 04/17/17 0502   Adult/Peds Plan of Care   Guidelines --  Inpatient Nursing Guidelines   Individualized Interventions/Recommendations #1 --  NGT remains at [facility] as per orders. Pt given sips and chips for comfort.    Individualized Interventions/Recommendations #2 (if applicable) --  Auprapubic foley cath to gravity and draining adequately.    Individualized Interventions/Recommendations #3 (if applicable) --  PRN pain medication given   Individualized Interventions/Recommendations #4 (if applicable) --  Scheduled reglan given to promote comfort.   Outcome Evaluation (rationale for progressing/not progressing) every shift --  Abdomen remains distended, bowel sounds hypoactive. Patient states abdomen feels more comofrtable than it did in day time. Patient sleeping upon reassessments after medications given. Will continue to monitor.    Patient /Family stated Goal   Patient /Family stated Goal sleep --

## 2017-04-17 NOTE — Interdisciplinary (Signed)
04/17/17 1038   Initial Assessment   CM Initial Assessment Completed   Patient Information   Where was the patient admitted from? Home   Prior to Level of Function Ambulatory/Independent with ADL's   Primary Caretaker(s) Spouse/Partner   Primary Contact Name, Number and [place], Spouse, [phone]   Permission to [place] Medicare;Other (Comment)  Tree surgeon)   Discharge Planning   Living Arrangements Spouse /Significant Other   Available Assistance/Support System Spouse / significant other   Type of Residence [place] No   Barriers to Discharge Awaiting clinical improvement   Patient/Family Engaged in Discharge Planning Yes   Patient Has Decision Making Capacity Yes   Patient/Family Are In Agreement With Discharge Plan Home w/ family. Independent   Social Worker Consult   Do you need to see a Education officer, museum?  No   Readmission Risk Assessment   Readmission Within 30 Days of Discharge Yes   Admission Was Unplanned   Patient Explanation  Got sicker   High Risk For Readmission No   MOON   MOON Provided to Patient Not Applicable     Insurance  Payor: MEDICARE / Plan: MEDICARE A & B / Product Type: Medicare /   Secondary: [employer]    Address: [address] [place] [address]  -  [phone] (home)         LOS at time of Initial Assessment: 1 Day 5 Hours  Pt admitted on 04/16/2017  5:17 AM  Inpatient LOS (if applicable): 1    Chief Complaint   Patient presents with    Urinary Catheter Problem     Patient recently admitted for multiple problems. Had super pubic catheter placed. Now having decreased output from catheter. Has been having multiple episodes of vomiting and diarrhea.       The encounter diagnosis was Ileus (CMS-HCC).    Case Management  Case Manager met with the patient and received consent to discuss dc plan.  Confirmed home address and phone number with  pt/family. Pt reports that previous level of functioning is  independent, no mobility related equipment was needed. No previous stay at [facility] or [facility]. Denies use of O2, BiPap/CPAP, and HD/PD.    Discharge planning: dc needs pending clinical course and primary team recommendation. Possible discharge plan to: Home w/ family. Denies any needs at this time. Care Coordination will follow to coordinate post discharge needs as requested.    Expected discharge date: 1-3 days pending ileus to be resolved.    Transportation: Family will provide transportation home.    DC Pharmacy:  [id number] [place] [phone] [place]    PCP:  [facility], [place]  / [place] [address], telephone [phone], fax [phone]    Barriers to DC: Clinical improvement. Currently w/ NGT for his SBO.       Kieth Brightly, RN  Inpatient Care Manager  Pager: [phone]  Cell Phone #: [phone]

## 2017-04-17 NOTE — Plan of Care (Signed)
Problem: Promotion of Health and Safety  Goal: Promotion of Health and Safety  The patient remains safe, receives appropriate treatment and achieves optimal outcomes (physically, psychosocially, and spiritually) within the limitations of the disease process by discharge.    Information below is the current care plan.   04/17/17 0502 04/17/17 0930 04/17/17 1830   Adult/Peds Plan of Care   Guidelines Inpatient Nursing Guidelines --  --    Individualized Interventions/Recommendations #1 NGT remains at [facility] as per orders. Pt given sips and chips for comfort.  --  --    Individualized Interventions/Recommendations #2 (if applicable) --  --  assisted patient to ambulate   Individualized Interventions/Recommendations #3 (if applicable) --  --  administer reglan as per order   Individualized Interventions/Recommendations #4 (if applicable) --  --  pain medication as per order   Individualized Interventions/Recommendations #5 (if applicable) --  --  NGT to wall suction.   Outcome Evaluation (rationale for progressing/not progressing) every shift --  --  patient ambulated in the hall way and took shower this morning, medicated with iv pain medication with good result.NGT clamped as per md order this later afternoon.   Patient /Family stated Goal   Patient /Family stated Goal --  wants to walk and take shower --

## 2017-04-17 NOTE — Progress Notes (Signed)
Vascular  Interventional Radiology Progress Note    Reason For Encounter:   67 year old male recently s/p robotic bladder diverticulectomy 04/10/17 with dysfunction of Foley catheter and bladder rupture. Patient requires suprapubic catheter for drainage.  Successful CT guided suprapubic catheter placement with a 10.2 Fr drainage catheter.    Subjective:  Have had hiccups since SPT placement.    Physical Exam:  Temperature:  [98.2 F (36.8 C)-99.1 F (37.3 C)] 98.9 F (37.2 C) (02/20 0734)  Blood pressure (BP): (127-157)/(65-91) 134/86 (02/20 0734)  Heart Rate:  [99-111] 110 (02/20 0734)  Respirations:  [16-20] 19 (02/20 0734)  Pain Score: Patient Sleeping, Respiratory Assessment Done (02/20 0506)  O2 Device: None (Room air) (02/20 0401)  SpO2:  [90 %-95 %] 92 % (02/20 0734)  BP 134/86 (BP Location: Left arm, BP Patient Position: Semi-Fowlers)    Pulse 110    Temp 98.9 F (37.2 C)    Resp 19    Ht 6' (1.829 m)    Wt 90.3 kg (199 lb 1.2 oz)    SpO2 92%    BMI 27.00 kg/m   02/19 0600 - 02/20 0559  In: 2103.8 [P.O.:225; I.V.:1878.8]  Out: 3950 [Urine:1250]    SPT:  Output: 1000 mls/24 hour   - amber   Dressing: C/D/I      Labs:  Lab Results   Component Value Date    WBC 8.2 04/17/2017    HGB 12.9 (L) 04/17/2017    HCT 38.8 (L) 04/17/2017    PLT 277 04/17/2017     Lab Results   Component Value Date    BUN 23 04/17/2017    CREAT 0.84 04/17/2017    CREAT 1.01 04/16/2017    CREAT 2.05 (H) 04/14/2017       ASSESSMENT AND PLAN   SPT s/p robotic bladder diverticulectomy 04/10/17 with dysfunction of Foley catheter and bladder rupture..  Tube functional. Continue drainage.  Plan per urology.    Plan:  Pigtail catheter can be exchanged for a Foley or an IR placed MIC tube at a future date. Catheter should be exchanged every 8-12 weeks if it is required long term.    Discussed case with VIR Fellow & attending: Dr. Louellen Molder PA-c  Vascular Interventional Radiology

## 2017-04-17 NOTE — Telephone Encounter (Signed)
Pt left msg stating he is still at the hospital and was unsure if he would be able to make appt today 02/20.    Appt was already cancelled, per notes, pt cancelled appt.    Left msg for pt, appt for today has been cancelled for 02/20. He still has appt w/ Dr. Nani Skillern for tomorrow 02/21 that I was advised to schedule for his hospital follow up. I left him a msg yesterday regarding appt for tomorrow 02/21.    Advised pt to call me back to confirm and update.

## 2017-04-17 NOTE — Progress Notes (Signed)
Urology Daily Progress Note    Patient Name: Pedro Shannon  MRN: [id number] Room#: 3361/3361    Service: Urology Attending Provider: Lannette Donath [facility] Day:   1 day - Admitted on: 04/16/2017  Post Operative Day(s):      ID: [name] is a60 year [place] old male with a history ofGl 3+3 PCA on AS, BPH s/p REZUM (06/2016), and bladder diverticula s/p robotic diverticulectomy on 04/10/17 c/b difficult foley and misplaced foley catheter, UR and bladder perforation at the site of his repair s/p IR placed SPT on POD1, now admitted with ileus.    Subjective:  Overnight Events: NAEON. Admitted to Urology. NG tube placed to suction with 1850 output.  No fevers or chills. No CP.  Patient feels better and less bloated.  Minimal ambulation.    Objective:    Vitals:  Temperature:  [98.1 F (36.7 C)-99.1 F (37.3 C)] 98.9 F (37.2 C) (02/20 0734)  Blood pressure (BP): (123-157)/(61-91) 134/86 (02/20 0734)  Heart Rate:  [92-111] 110 (02/20 0734)  Respirations:  [16-20] 19 (02/20 0734)  Pain Score: Patient Sleeping, Respiratory Assessment Done (02/20 0506)  O2 Device: None (Room air) (02/20 0401)  SpO2:  [90 %-95 %] 92 % (02/20 0734)    Weight: 90.3 kg (199 lb 1.2 oz)  Percentage Weight Change (%): 0 %    Intake and Output:  02/19 0600 - 02/20 0559  In: 2103.8 [P.O.:225; I.V.:1878.8]  Out: 3950 [Urine:1250]    Physical Exam:   Gen: NAD, friendly, conversant, NGT in place with bilious output.  CV: Normal rate  L: Nonlabored breathing on RA  Abd: Minimally distended. Incisions are well healed, c/d/i. No erythema or e/o infection  GU: SPT in place, CYU.  Ext: No edema    Labs:  145 (02/20) 104 (02/20) 23 (02/20) 104* (02/20)   3.6 (02/20) 31* (02/20) 0.84 (02/20)        8.2 (02/20) 12.9* (02/20) 277 (02/20)    38.8* (02/20)                        Micro:  No results found for: [place]    Radiology:   CT 04/16/2017  FINDINGS:  LUNG BASES: Right middle and left lower lobe  ground-glass opacification and consolidation. Subsegmental atelectasis in the right lower lobe.  LIVER:Unremarkable  BILIARY:Unremarkable  PANCREAS: Unremarkable  SPLEEN: Unremarkable  ADRENAL GLANDS: Unremarkable  KIDNEYS: Bilateral parapelvic cysts.  STOMACH/DUODENUM: Distended stomach and duodenum.  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 Mild atherosclerotic calcifications.  LYMPHATIC: Unremarkable  SMALL & LARGE BOWEL: Diffuse small bowel distension without transition point. Normal appendix. Descending and sigmoid colon diverticulosis.  BLADDER/PELVIC ORGANS: Bladder is decompressed with a suprapubic catheter. Small amount of fluid along the right aspect of the bladder.  BONES/SOFT TISSUES: Multilevel degenerative spine changes are worst at L4-5. Stable left femoral neck lucent lesion. Trace pneumoperitoneum. Extensive subcutaneous emphysema.  OTHER: Bilateral fat-containing inguinal hernias.    Medications:  Scheduled Meds   metoclopramide  5 mg Q6H    senna  2 tablet HS     PRN Meds   morphine  2 mg Q4H PRN    morphine  2 mg Q4H PRN    morphine  4 mg Q4H PRN    nalOXone  0.1 mg Q2 Min PRN    phenol  3 spray Q2H PRN    prochlorperazine  10 mg Q6H  PRN     IV Meds   sodium chloride 100 mL/hr at 04/17/17 5300       PLANS/RECOMMENDATIONS:  Pedro Shannon is a77 year [place] old male with a history ofGl 3+3 PCA on AS, BPH s/p REZUM (06/2016), and bladder diverticula s/p robotic diverticulectomy on 04/10/17 c/b difficult foley and misplaced foley catheter, UR and bladder perforation at the site of his repair s/p IR placed SPT on POD1, now admitted with ileus.    Neuro: Pain well controlled  CV: HDS  Pulm: On RA  FEN/GI: NPO, NGT to low intermittent suction  Renal/GU: SPT to drainage   ID: No antibiotics right now. Monitor WBC and temp  Heme: Stable, continue to observe  Endocrine: Stable  Ppx: SCDs, OOB  Dispo: Pending resolution of ileus      Seen with team and  discussed with attending Dr. Lucia Bitter.     Tyron Russell, MD  [facility], PGY-3  Pager: [phone]

## 2017-04-18 ENCOUNTER — Encounter (HOSPITAL_BASED_OUTPATIENT_CLINIC_OR_DEPARTMENT_OTHER): Payer: Medicare Other | Admitting: Urology

## 2017-04-18 LAB — BASIC METABOLIC PANEL, BLOOD
Anion Gap: 11 mmol/L (ref 7–15)
BUN: 27 mg/dL — ABNORMAL HIGH (ref 8–23)
Bicarbonate: 35 mmol/L — ABNORMAL HIGH (ref 22–29)
Calcium: 8.4 mg/dL — ABNORMAL LOW (ref 8.5–10.6)
Chloride: 95 mmol/L — ABNORMAL LOW (ref 98–107)
Creatinine: 0.88 mg/dL (ref 0.67–1.17)
GFR: >60 mL/min
Glucose: 182 mg/dL — ABNORMAL HIGH (ref 70–99)
Potassium: 3.3 mmol/L — ABNORMAL LOW (ref 3.5–5.1)
Sodium: 141 mmol/L (ref 136–145)

## 2017-04-18 LAB — PHOSPHORUS, BLOOD: Phosphorous: 2.6 mg/dL — ABNORMAL LOW (ref 2.7–4.5)

## 2017-04-18 LAB — MAGNESIUM, BLOOD: Magnesium: 2.2 mg/dL (ref 1.6–2.4)

## 2017-04-18 MED ORDER — POTASSIUM CHLORIDE 10 MEQ/100ML IV SOLN
10.0000 meq | INTRAVENOUS | Status: AC
Start: 2017-04-18 — End: 2017-04-18
  Administered 2017-04-18 (×3): 10 meq via INTRAVENOUS
  Filled 2017-04-18 (×3): qty 100

## 2017-04-18 MED ORDER — BISACODYL 10 MG RE SUPP
10.0000 mg | Freq: Every day | RECTAL | Status: DC
Start: 2017-04-18 — End: 2017-04-20
  Administered 2017-04-18: 10 mg via RECTAL
  Filled 2017-04-18 (×2): qty 1

## 2017-04-18 MED ORDER — SODIUM PHOSPHATE 10 MEQ/50 ML NS (~~LOC~~)
10.0000 meq | Freq: Once | Status: AC
Start: 2017-04-18 — End: 2017-04-18
  Administered 2017-04-18: 10 meq via INTRAVENOUS
  Filled 2017-04-18: qty 50

## 2017-04-18 NOTE — Plan of Care (Signed)
Problem: Promotion of Health and Safety  Goal: Promotion of Health and Safety  The patient remains safe, receives appropriate treatment and achieves optimal outcomes (physically, psychosocially, and spiritually) within the limitations of the disease process by discharge.    Information below is the current care plan.  Outcome: Progressing   04/17/17 0502 04/17/17 1830 04/18/17 0345   Adult/Peds Plan of Care   Guidelines Inpatient Nursing Guidelines --  --    Individualized Interventions/Recommendations #1 --  --  NGT clamp   Individualized Interventions/Recommendations #2 (if applicable) --  assisted patient to ambulate --    Individualized Interventions/Recommendations #3 (if applicable) --  administer reglan as per order --    Individualized Interventions/Recommendations #4 (if applicable) --  --  assess and monitor GI motility   Individualized Interventions/Recommendations #5 (if applicable) --  --  monitor fluid and electrolyte balance   Outcome Evaluation (rationale for progressing/not progressing) every shift --  --  PT with episode on non compliant   Patient /Family stated Goal   Patient /Family stated Goal --  --  pain control

## 2017-04-18 NOTE — Plan of Care (Signed)
Problem: Promotion of Health and Safety  Goal: Promotion of Health and Safety  The patient remains safe, receives appropriate treatment and achieves optimal outcomes (physically, psychosocially, and spiritually) within the limitations of the disease process by discharge.    Information below is the current care plan.  Outcome: Progressing   04/17/17 0502 04/17/17 1830 04/18/17 0800   Adult/Peds Plan of Care   Guidelines Inpatient Nursing Guidelines --  --    Individualized Interventions/Recommendations #1 --  --  --    Individualized Interventions/Recommendations #2 (if applicable) --  assisted patient to ambulate --    Individualized Interventions/Recommendations #3 (if applicable) --  --  --    Individualized Interventions/Recommendations #4 (if applicable) --  --  --    Outcome Evaluation (rationale for progressing/not progressing) every shift --  --  --    Patient /Family stated Goal   Patient /Family stated Goal --  --  Antiemetic admin.    04/18/17 1144   Adult/Peds Plan of Care   Guidelines --    Individualized Interventions/Recommendations #1 Maintain NGT to low intermittent suction   Individualized Interventions/Recommendations #2 (if applicable) --    Individualized Interventions/Recommendations #3 (if applicable) Provide antiemetics as ordered   Individualized Interventions/Recommendations #4 (if applicable) Assess/monitor bowel fuction   Outcome Evaluation (rationale for progressing/not progressing) every shift Pt passing gas, small BM in morning. Pt aware he has a suppository ordered, requesting to have it in afternoon. Antiemetics given as ordered. NGT to low intermittent suction with moderate Maliik Karner/black output.   Patient /Family stated Goal   Patient /Family stated Goal --

## 2017-04-18 NOTE — Progress Notes (Deleted)
Reason for visit:   No chief complaint on file.    Demographics:        Date: 03/02/16       Patient Name: Pedro Shannon       Medical Record #: [id number]       DOB: 11-14-1950       Age: 67 year old       Sex: male    Dicks, [name], [address] [place], [place] [address]  [place] [employer]    HPI:    Pedro Shannon is a 67 year old male with a h/o BPH w/LUTS and no family hx of prostate cancer presenting for reassessment of lower urinary tract symptoms status post REZUM 07/18/2016, incontinence, bladder diverticula, prostate cancer on active surveillance    The reports his symptoms are improved after Rezum. He reports significantly improved frequency every 1-2 hours in the day, and reduced nocturia to 0-4 a night fr. He does report persistent urgency with some occasional drips of incontinence associated with the urgency  reports a medium to good stream, and no straining or hesitancy. He denies hematuria or dysuria. On Flomax 0.8 mg daily SS: 23/5    He also has 2 posterior bladder diverticula visualized on cysto during Rezum that the pt is interested in treating eventually.    He also has a h/o elevated PSA, prostate nodule in the setting of a suspicious MRI, now SP MRI biopsy 10/28/15 for a small focus of Gleason 6 disease. Most recent PSA 05/08/16 was 2.12.      [facility] IPSS REVIEW FS 10/12/2016 08/30/2016 06/07/2016 11/10/2015   Incomplete Emptying 4 3 4 3    Frequency 5 2 4 3    Intermittencey 2 2 4  0   Urgency 4 3 4 2    Weak Stream 4 1 4 1    Straining 2 1 3  0   Nocturia 4 3 3 3    IPSS Total Score 25 15 26 12    Quality of Life 6 4 5 5        He is sexually active on bi-mix with good results. Having some am erections.    No new onset bone pain, night sweats, fevers, chills or unintended weight loss.    Lab Results   Component Value Date/Time    PSA 2.12 05/08/2016 01:15 PM    PSA 2.92 07/26/2015 03:04 PM        PSA 3.2 01/09/16.    FINAL PATHOLOGIC DIAGNOSIS 10/28/15 reviewed personally:  A: Prostate,  right lateral apex, biopsy  -Benign prostatic tissue.  -See comment.  B: Prostate, right medial apex, biopsy       -Benign prostatic tissue.  C: Prostate, right lateral mid, biopsy       -Benign fibromuscular tissue.  D: Prostate, right medial mid, biopsy       -Benign prostatic tissue.       -See comment.  E: Prostate, right lateral base, biopsy       -Benign prostatic tissue.  F: Prostate, right medial base, biopsy       -Benign prostatic tissue.  G: Prostate, left lateral apex, biopsy       -Benign prostatic tissue.  H: Prostate, left medial apex, biopsy       -Benign prostatic tissue with mild acute inflammation.  I: Prostate, left lateral mid, biopsy  -Adenocarcinoma, Gleason score 3+3=6 ,involving <5% of the length of one  out of one core (Grade group 1; <1  mm tumor length).  -See comment.  J: Prostate, left medial mid, biopsy       -Benign prostatic tissue.  K: Prostate, left lateral base, biopsy       -Benign prostatic tissue.  L: Prostate, left medial base, biopsy       -Benign prostatic tissue.       -Seminal vesicle/ejaculatory duct tissue identified.  M: Prostate, target #1, biopsy       -Benign prostatic tissue.  N: Prostate, target #2, biopsy  -Prostatic tissue with a small focus of atypical glands, cannot exclude  prostatic adenocarcinoma.       -See comment.    MRI PELVIS WO/W CONTRAST 08/25/15 reviewed personally:  1. Two PI-RADS 3 lesions, in the left prostatic transition zone mid-gland to   base, and in the right prostatic mid-gland, both possibly also involving the   peripheral zone.    2. No definite extra-prostatic extension.    3. Multiple incidental findings including bilateral renal hernias, bladder   diverticula, bladder wall thickening, and colonic diverticuli.    Outside Records:  PSA  07/15/15 - 2.4  01/06/15 - 4.3  07/01/14 - 3.0  11/19/13 - 3.7  (% free 9%)  12/19/11 - 3.4  08/03/11 - 3.3  12/18/10 - 3.1  06/17/10 - 3.0    4K Score 02/26/15:    24% - high risk    Pathology 06/16/15:  A.  L  Base  Atypical small acinar proliferation  B.  L Lateral base  Benign prostatic tissue  C.  L Mid  Atypical small acinar proliferation  D.  L Lateral Mid  Benign prostatic tissue  E.  L Apex  Benign prostatic tissue  F.  L Lateral Apex  Benign prostatic tissue  G.  R Base  Benign prostatic tissue  H.  R Lateral base  Benign prostatic tissue  I.  R Mid  Atypical small acinar proliferation  J.  R Lateral Mid  Benign prostatic tissue  K.  R Apex  Benign prostatic tissue  L.  R Lateral Apex  Benign prostatic tissue    MRI 05/30/15:  PIRADS 4 lesion involving left base with possible extracapsular extension     Review of Systems    \    Wellbeing assessment:  Wellbeing screening reviewed.    Pt indicated no needs.    Past Medical History:   Diagnosis Date    Prostate cancer (CMS-HCC)        Past Surgical History:   Procedure Laterality Date    NASAL SEPTOPLASTY      R inguinal hernia      SUPRAPUBIC TUBE PLACEMENT         Allergies   Allergen Reactions    Dutasteride Hives    Rogaine [Minoxidil] Hives       No current facility-administered medications for this visit.      No current outpatient medications on file.     Facility-Administered Medications Ordered in Other Visits   Medication Dose Route Frequency Provider Last Rate Last Dose    dextrose 5%-sodium chloride 0.45%-potassium chloride 20 mEq/L infusion   IntraVENOUS Continuous Cotta, Magdalen Spatz, MD 75 mL/hr at 04/17/17 1656      metoclopramide (REGLAN) injection 5 mg  5 mg IntraVENOUS Q6H Vilinda Blanks, MD   5 mg at 04/18/17 0231    nalOXone (NARCAN) injection 0.1 mg  0.1 mg IntraVENOUS Q2 Min PRN Vilinda Blanks, MD        phenol (  CHLORASEPTIC) 1.4 % throat spray 3 spray  3 spray Mouth/Throat Q2H PRN Vilinda Blanks, MD        prochlorperazine (COMPAZINE) injection 10 mg  10 mg IntraVENOUS Q6H PRN Tyron Russell, MD   10 mg at 04/17/17 2017    senna (SENOKOT) tablet 17.2 mg  2 tablet Oral HS Vilinda Blanks, MD   17.2 mg at  04/17/17 2019       Social History     Socioeconomic History    Marital status: Married     Spouse name: Not on file    Number of children: Not on file    Years of education: Not on file    Highest education level: Not on file   Social Needs    Financial resource strain: Not on file    Food insecurity - worry: Not on file    Food insecurity - inability: Not on file    Transportation needs - medical: Not on file    Transportation needs - non-medical: Not on file   Occupational History    Occupation: Chief Executive Officer   Tobacco Use    Smoking status: Never Smoker    Smokeless tobacco: Never Used   Substance and Sexual Activity    Alcohol use: No    Drug use: No    Sexual activity: Not on file   Other Topics Concern    Not on file   Social History Narrative    Not on file       Physical Exam:   There were no vitals filed for this visit.    GENERAL: The patient is alert, cooperative and in no acute distress.   HEENT: normalcephalic/atraumatic  NECK: midline trachea. No jugular venous distention  PULM: no evidence of labored breathing  GI: soft, NT, ND, no massess.  No hepatosplenomegaly  BACK: No CVAT  EXTREMITES: Joints are normal without redness or swelling.   SKIN: There is no edema or cyanosis.   NEURO: no motor or sensory deficits.  Alert and Oriented x 3    Labs/Diagnostic X-rays:  Lab Results   Component Value Date    WBC 8.2 04/17/2017    RBC 3.96 (L) 04/17/2017    HGB 12.9 (L) 04/17/2017    HCT 38.8 (L) 04/17/2017    MCV 98.0 (H) 04/17/2017    MCHC 33.2 04/17/2017    RDW 14.0 04/17/2017    PLT 277 04/17/2017    MPV 9.8 04/17/2017    LYMPHS 11 04/17/2017    MONOS 10 04/17/2017    EOS 1 04/17/2017    BASOS 0 04/17/2017       Lab Results   Component Value Date    BUN 23 04/17/2017    CREAT 0.84 04/17/2017    CL 104 04/17/2017    NA 145 04/17/2017    K 3.6 04/17/2017    Rosalie 8.0 (L) 04/17/2017    TBILI 1.20 (H) 04/16/2017    ALB 3.8 04/16/2017    TP 7.3 04/16/2017    AST 21 04/16/2017    ALK 59 04/16/2017     BICARB 31 (H) 04/17/2017    ALT 21 04/16/2017    GLU 104 (H) 04/17/2017       Lab Results   Component Value Date    PSA 2.12 05/08/2016       No results found for: TESTOSTERONE    Lab Results   Component Value Date    COLORUA Amber 04/16/2017    APPEARUA Hazy 04/16/2017  GLUCOSEUA Negative 04/16/2017    BILIUA Negative 04/16/2017    KETONEUA 1+ (A) 04/16/2017    SGUA 1.026 04/16/2017    BLOODUA 3+ (A) 04/16/2017    PHUA 6.0 04/16/2017    PROTEINUA 2+ (A) 04/16/2017    UROBILUA Negative 04/16/2017    NITRITEUA Positive (A) 04/16/2017    LEUKESTUA 2+ (A) 04/16/2017    WBCUA >50 (A) 04/16/2017    RBCUA >50 (A) 04/16/2017         Assessment/Plan:   66 year old gentleman status post transurethral needle ablation of the prostate using steam.    He has ongoing urinary tract symptoms in keeping with his bladder diverticula.  He is requesting diverticulectomy.    He has 2 large diverticulae.    He was consented for diverticulectomy with possible ureteral reimplantation.  This is to be done in a robotic assisted laparoscopic, possible open fashion.    Risks outlined to the patient included bleeding, infection, damage to adjacent structures, urine leak, the potential need for ureteral reimplantation, heart attack stroke and death.      Thank you for allowing me to participate in this patient's care.   Sincerely,   Patty Sermons Kader

## 2017-04-18 NOTE — Progress Notes (Signed)
Urology Daily Progress Note    Patient Name: Torrell Krutz  MRN: [id number] Room#: 3361/3361    Service: Urology Attending Provider: Lannette Donath [facility] Day:   2 days - Admitted on: 04/16/2017  Post Operative Day(s):      ID: Mr. Lleyton Byers is a11 year [place] old male with a history ofGl 3+3 PCA on AS, BPH s/p REZUM (06/2016), and bladder diverticula s/p robotic diverticulectomy on 04/10/17 c/b difficult foley and misplaced foley catheter, UR and bladder perforation at the site of his repair s/p IR placed SPT on POD1, now admitted with ileus.    Subjective:  Overnight Events: NAEON.  Began to feel better yesterday evening and clamped NGT and began to take PO intake, but started vomiting early this AM and suction restarted.  Non bilious. No blood.  No fevers or chills.      Objective:    Vitals:  Temperature:  [97.8 F (36.6 C)-98.5 F (36.9 C)] 98.2 F (36.8 C) (02/21 0732)  Blood pressure (BP): (127-136)/(75-84) 127/75 (02/21 0732)  Heart Rate:  [99-112] 99 (02/21 0732)  Respirations:  [16-18] 16 (02/21 0732)  Pain Score: 0 (02/21 0600)  O2 Device: None (Room air) (02/20 2000)  SpO2:  [93 %-97 %] 93 % (02/21 0732)    Weight: 90.3 kg (199 lb 1.2 oz)  Percentage Weight Change (%): 0 %    Intake and Output:  02/20 0600 - 02/21 0559  In: 5885 [P.O.:200; I.V.:1250]  Out: 0277 [Urine:1250]    Physical Exam:   Gen: NAD, friendly, conversant, NGT in place with bilious output.  CV: Normal rate  L: Nonlabored breathing on RA  Abd: Minimally distended. Incisions are well healed, c/d/i. No erythema or e/o infection  GU: SPT in place, CYU.  Ext: No edema    Labs:  141 (02/21) 95* (02/21) 27* (02/21) 182* (02/21)   3.3* (02/21) 35* (02/21) 0.88 (02/21)        8.2 (02/20) 12.9* (02/20) 277 (02/20)    38.8* (02/20)                        Micro:  No results found for: Quintin Alto    Radiology:   CT 04/16/2017  FINDINGS:  LUNG BASES: Right middle and left lower lobe ground-glass  opacification and consolidation. Subsegmental atelectasis in the right lower lobe.  LIVER:Unremarkable  BILIARY:Unremarkable  PANCREAS: Unremarkable  SPLEEN: Unremarkable  ADRENAL GLANDS: Unremarkable  KIDNEYS: Bilateral parapelvic cysts.  STOMACH/DUODENUM: Distended stomach and duodenum.  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 Mild atherosclerotic calcifications.  LYMPHATIC: Unremarkable  SMALL & LARGE BOWEL: Diffuse small bowel distension without transition point. Normal appendix. Descending and sigmoid colon diverticulosis.  BLADDER/PELVIC ORGANS: Bladder is decompressed with a suprapubic catheter. Small amount of fluid along the right aspect of the bladder.  BONES/SOFT TISSUES: Multilevel degenerative spine changes are worst at L4-5. Stable left femoral neck lucent lesion. Trace pneumoperitoneum. Extensive subcutaneous emphysema.  OTHER: Bilateral fat-containing inguinal hernias.    Medications:  Scheduled Meds   bisacodyl  10 mg Daily    metoclopramide  5 mg Q6H    potassium chloride  10 mEq Q1H    senna  2 tablet HS     PRN Meds   nalOXone  0.1 mg Q2 Min PRN    phenol  3 spray Q2H PRN    prochlorperazine  10 mg Q6H PRN  IV Meds   dextrose 5%-sodium chloride 0.45%-potassium chloride 20 mEq/L 100 mL/hr at 04/18/17 1014       PLANS/RECOMMENDATIONS:  Mr. Erven Ramson is a60 year [place] old male with a history ofGl 3+3 PCA on AS, BPH s/p REZUM (06/2016), and bladder diverticula s/p robotic diverticulectomy on 04/10/17 c/b difficult foley and misplaced foley catheter, UR and bladder perforation at the site of his repair s/p IR placed SPT on POD1, now admitted with ileus.    Patient attempted dietary advancement but failed PO intake and now vomiting with restarting of NGT.      Neuro: Pain well controlled  CV: HDS  Pulm: On RA  FEN/GI: NPO, NGT to low intermittent suction  Renal/GU: SPT to drainage   ID: No antibiotics right now. Monitor WBC and temp  Heme: Stable,  continue to observe  Endocrine: Stable  Ppx: SCDs, OOB  Dispo: Pending resolution of ileus      Seen with team and discussed with attending Dr. Nani Skillern.     Tyron Russell, MD  [facility], PGY-3  Pager: [phone]

## 2017-04-18 NOTE — Interdisciplinary (Signed)
PT back to bed. NGT intact and patent to low intermittent suction  As ordered by first call resident. Obtained 1200 blackish gastric output.. PT stated " im feeling better"

## 2017-04-18 NOTE — Telephone Encounter (Signed)
Left msg for pt - advised appt for today 02/21 has been cancelled since he is still in the hospital. I will keep appt on Monday 02/25 to check if he is still at the hospital. Advised to call me if he feels he will not d/c by Tuesday so we can cancel appt. Otherwise, I will check on Monday if he has been d/c.

## 2017-04-18 NOTE — Interdisciplinary (Signed)
PT up and sitting in his chair. Abdomen fully distended, tender and hypoactive bowel sound for all quadrants. Vomitting with blackish gastric contents.. MD notified.

## 2017-04-19 LAB — PHOSPHORUS, BLOOD: Phosphorous: 3.6 mg/dL (ref 2.7–4.5)

## 2017-04-19 LAB — BASIC METABOLIC PANEL, BLOOD
Anion Gap: 11 mmol/L (ref 7–15)
BUN: 24 mg/dL — ABNORMAL HIGH (ref 8–23)
Bicarbonate: 37 mmol/L — ABNORMAL HIGH (ref 22–29)
Calcium: 8.2 mg/dL — ABNORMAL LOW (ref 8.5–10.6)
Chloride: 96 mmol/L — ABNORMAL LOW (ref 98–107)
Creatinine: 1.08 mg/dL (ref 0.67–1.17)
GFR: >60 mL/min
Glucose: 140 mg/dL — ABNORMAL HIGH (ref 70–99)
Potassium: 3.3 mmol/L — ABNORMAL LOW (ref 3.5–5.1)
Sodium: 144 mmol/L (ref 136–145)

## 2017-04-19 LAB — MAGNESIUM, BLOOD: Magnesium: 2.2 mg/dL (ref 1.6–2.4)

## 2017-04-19 NOTE — Plan of Care (Signed)
Problem: Promotion of Health and Safety  Goal: Promotion of Health and Safety  The patient remains safe, receives appropriate treatment and achieves optimal outcomes (physically, psychosocially, and spiritually) within the limitations of the disease process by discharge.    Information below is the current care plan.  Outcome: Progressing   04/19/17 0226 04/19/17 1201   Adult/Peds Plan of Care   Guidelines --  Inpatient Nursing Guidelines   Individualized Interventions/Recommendations #1 --  Patient's NGT removed per MD order.    Individualized Interventions/Recommendations #2 (if applicable) --  Patient urinating in bathroom as well as emptying his urine bag from his supreapublic catheter.    Individualized Interventions/Recommendations #3 (if applicable) --  Patient on antiemetics as ordered.    Individualized Interventions/Recommendations #5 (if applicable) Monotor fluid and elctrolyte balance --    Outcome Evaluation (rationale for progressing/not progressing) every shift --  Patient's VSS. No pain medication needed. NG tube removed and patient tolerated the procedure well. Will continue to monitor.    Patient /Family stated Goal   Patient /Family stated Goal --  start on a clear liquid diet

## 2017-04-19 NOTE — Interdisciplinary (Signed)
04/19/17 1053   Provider Notification   Provider Notified Physician   Provider Name 1st call   Method of Communication Page   Reason for Communication Nathanyal Ashmead ([phone])- after 30 min, patient's output is 20 cc of green fluid.

## 2017-04-19 NOTE — Interdisciplinary (Signed)
Nutrition initial assessment RN trigger: none  A: 67 year old male with h/o Gl 3+3 PCA on AS, BPH s/p REZUM (06/2016), and bladder diverticula s/p robotic diverticulectomy on 04/10/17 c/b difficult foley and misplaced foley catheter, UR and bladder perforation at the site of his repair s/p IR placed SPT on POD1 now admitted with Ileus, HD#3  Past Medical History:   Diagnosis Date    Prostate cancer (CMS-HCC)      Past Surgical History:   Procedure Laterality Date    NASAL SEPTOPLASTY      R inguinal hernia      SUPRAPUBIC TUBE PLACEMENT       Current Diet Rx: just changed to CL 8oz per 8hr shift, pt is very happy to start drinking a tea, feels great, no N/V, expect ADAT, discussed diet progression  Allergies/Intolerances: NKFA   Nutrition Hx/Preferences: no special diet, eating well PTA  GI: distended abdomen hypoactive BS, +flatus, NGT@ LIS (output 15249ml ?), N/V on Reglan, LBM 1x on 2/21    Anthropometrics:  Height: 6' (182.9 cm) Adm Weight: 90.3 kg (199 lb 1.2 oz) on 2/19 via bed scale, Body mass index is 27 kg/m. @111 %IBW of IBW 81kg, UBW 190-200# (86-91kg) per pt, no sig wt loss  Wt Readings from Last 60 Encounters:   04/16/17 90.3 kg (199 lb 1.2 oz)   04/13/17 88.5 kg (195 lb)   04/10/17 91.1 kg (200 lb 12.8 oz)   04/03/17 90 kg (198 lb 6.6 oz)   03/29/17 90 kg (198 lb 8 oz)   02/28/17 90.3 kg (199 lb)   08/30/16 89.3 kg (196 lb 12.8 oz)   07/18/16 88.8 kg (195 lb 11.2 oz)   06/07/16 88.3 kg (194 lb 11.2 oz)   05/10/16 87.1 kg (192 lb)   03/02/16 87.2 kg (192 lb 3.9 oz)   11/10/15 87.4 kg (192 lb 10.9 oz)   10/28/15 85.3 kg (188 lb)   09/16/15 85.3 kg (188 lb 0.8 oz)   07/26/15 87 kg (191 lb 12.8 oz)     Nutrition-Focused Physical Exam: (2/22) no dificit  Skin: incision @abdomen   Hydration status; normal, UOP 1230ml, Edema: none    Pertinent Labs:   Recent Labs     04/17/17  0535 04/18/17  0650 04/19/17  0535   NA 145 141 144   K 3.6 3.3* 3.3*   BUN 23 27* 24*   CREAT 0.84 0.88 1.08   GLU 104* 182* 140*      [place] 8.0* 8.4* 8.2*   PHOS 3.8 2.6* 3.6   MG 2.0 2.2 2.2   HGB 12.9*  --   --    HCT 38.8*  --   --    WBC 8.2  --   --    MCH 32.6*  --   --    MCV 98.0*  --   --       Pertinent meds: reviewed   Est needs: (based on 90kg); 1980-2520kcal (22-28kcal/kg; REE per Mifflin-St Jeor 1728kcal), 90-135g     D(nutritional diagnosis): inadequate oral intake/altered GI fx r/t medical condition AEB NPO/NGT status for 3 days  I(ntervention): Goal 2/22: To receive nutrition provision by HD5-7  PLANS / RECOMMENDATIONS:  - anticipate ADAT as medically feasible to regular diet by HD5-7  M/E(monitoring/evaluation):   Disposition: pending clinical course   Education: if/when appropriate   Discussed plans and recommendations with care team members including 1st call   RD will monitor medical course/nutrition status and RD/DTR follow  up per department policy   Foster Simpson RD, [place], pager [phone]

## 2017-04-19 NOTE — Interdisciplinary (Signed)
Report received from night RN, Joel.

## 2017-04-19 NOTE — Interdisciplinary (Signed)
04/19/17 1705   Provider Notification   Provider Notified Physician   Provider Name 1st call   Method of Communication Page   Reason for Communication Pedro Shannon ([phone])- Patient's K is 3.3 today.. did you want to give replacement? getting some K in his fluids. Thank you

## 2017-04-19 NOTE — Interdisciplinary (Signed)
Urology MD called back and said to removed the patient's NG tube and he will be started on a clear liquid diet.

## 2017-04-19 NOTE — Progress Notes (Signed)
Urology Daily Progress Note    Patient Name: Pedro Shannon  MRN: [id number] Room#: 3361/3361    Service: Urology Attending Provider: Lannette Donath [facility] Day:   3 days - Admitted on: 04/16/2017  Post Operative Day(s):      ID: Pedro Shannon is a72 year [place] old male with a history ofGl 3+3 PCA on AS, BPH s/p REZUM (06/2016), and bladder diverticula s/p robotic diverticulectomy on 04/10/17 c/b difficult foley and misplaced foley catheter, UR and bladder perforation at the site of his repair s/p IR placed SPT on POD1, now admitted with ileus.    Subjective:  Overnight Events: NAEON.  Again feels that he is doing really well and passing flatus, BM x1. Pleading for another PO attempt.  Non bilious. No bloody output. Recorded as high output from NGT, likely recording error. About 1.5L estimated.  No fevers or chills.      Objective:    Vitals:  Temperature:  [97.9 F (36.6 C)-98.9 F (37.2 C)] 98.1 F (36.7 C) (02/22 0400)  Blood pressure (BP): (128-132)/(74-79) 132/74 (02/22 0400)  Heart Rate:  [87-98] 88 (02/22 0400)  Respirations:  [16-17] 16 (02/22 0400)  Pain Score: 0 (02/22 0600)  O2 Device: None (Room air) (02/22 0400)  SpO2:  [96 %-98 %] 96 % (02/22 0400)    Weight: 90.3 kg (199 lb 1.2 oz)  Percentage Weight Change (%): 0 %    Intake and Output:  02/21 0600 - 02/22 0559  In: 40 [P.O.:40]  Out: 38250 [Urine:1200]    Physical Exam:   Gen: NAD, friendly, conversant, NGT in place with bilious output.  CV: Normal rate  L: Nonlabored breathing on RA  Abd: Minimally distended. Incisions are well healed, c/d/i. No erythema or e/o infection  GU: SPT in place, CYU.  Ext: No edema    Labs:  144 (02/22) 96* (02/22) 24* (02/22) 140* (02/22)   3.3* (02/22) 37* (02/22) 1.08 (02/22)        8.2 (02/20) 12.9* (02/20) 277 (02/20)    38.8* (02/20)                        Micro:  No results found for: Quintin Alto    Radiology:   CT 04/16/2017  FINDINGS:  LUNG BASES: Right middle and  left lower lobe ground-glass opacification and consolidation. Subsegmental atelectasis in the right lower lobe.  LIVER:Unremarkable  BILIARY:Unremarkable  PANCREAS: Unremarkable  SPLEEN: Unremarkable  ADRENAL GLANDS: Unremarkable  KIDNEYS: Bilateral parapelvic cysts.  STOMACH/DUODENUM: Distended stomach and duodenum.  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 Mild atherosclerotic calcifications.  LYMPHATIC: Unremarkable  SMALL & LARGE BOWEL: Diffuse small bowel distension without transition point. Normal appendix. Descending and sigmoid colon diverticulosis.  BLADDER/PELVIC ORGANS: Bladder is decompressed with a suprapubic catheter. Small amount of fluid along the right aspect of the bladder.  BONES/SOFT TISSUES: Multilevel degenerative spine changes are worst at L4-5. Stable left femoral neck lucent lesion. Trace pneumoperitoneum. Extensive subcutaneous emphysema.  OTHER: Bilateral fat-containing inguinal hernias.    Medications:  Scheduled Meds   bisacodyl  10 mg Daily    metoclopramide  5 mg Q6H    senna  2 tablet HS     PRN Meds   nalOXone  0.1 mg Q2 Min PRN    phenol  3 spray Q2H PRN    prochlorperazine  10 mg Q6H PRN     IV Meds  dextrose 5%-sodium chloride 0.45%-potassium chloride 20 mEq/L 1,000 mL (04/19/17 0546)       PLANS/RECOMMENDATIONS:  Pedro Shannon is a57 year [place] old male with a history ofGl 3+3 PCA on AS, BPH s/p REZUM (06/2016), and bladder diverticula s/p robotic diverticulectomy on 04/10/17 c/b difficult foley and misplaced foley catheter, UR and bladder perforation at the site of his repair s/p IR placed SPT on POD1, now admitted with ileus.    Feeling improved. Abdomen is soft.  Will attempt to clamp NGT x3 hours and replace to suction to evaluate volume in stomach. Will reassess at that time      Neuro: Pain well controlled  CV: HDS  Pulm: On RA  FEN/GI: NPO, NGT clamp trial as above.  Renal/GU: SPT to drainage   ID: No antibiotics right  now. Monitor WBC and temp  Heme: Stable, continue to observe  Endocrine: Stable  Ppx: SCDs, OOB  Dispo: Pending resolution of ileus      Seen with team and discussed with attending Dr. Nani Skillern.     Tyron Russell, MD  [facility], PGY-3  Pager: [phone]

## 2017-04-19 NOTE — Plan of Care (Signed)
Problem: Promotion of Health and Safety  Goal: Promotion of Health and Safety  The patient remains safe, receives appropriate treatment and achieves optimal outcomes (physically, psychosocially, and spiritually) within the limitations of the disease process by discharge.    Information below is the current care plan.  Outcome: Progressing   04/17/17 1830 04/18/17 1144 04/19/17 0226   Adult/Peds Plan of Care   Individualized Interventions/Recommendations #1 --  Maintain NGT to low intermittent suction --    Individualized Interventions/Recommendations #2 (if applicable) assisted patient to ambulate --  --    Individualized Interventions/Recommendations #3 (if applicable) --  Provide antiemetics as ordered --    Individualized Interventions/Recommendations #4 (if applicable) --  Assess/monitor bowel fuction --    Individualized Interventions/Recommendations #5 (if applicable) --  --  Monotor fluid and elctrolyte balance   Outcome Evaluation (rationale for progressing/not progressing) every shift --  --  Monitor gastric motility   Patient /Family stated Goal   Patient /Family stated Goal --  --  wILL HAVE bm

## 2017-04-20 LAB — BASIC METABOLIC PANEL, BLOOD
Anion Gap: 14 mmol/L (ref 7–15)
BUN: 19 mg/dL (ref 8–23)
Bicarbonate: 30 mmol/L — ABNORMAL HIGH (ref 22–29)
Calcium: 8 mg/dL — ABNORMAL LOW (ref 8.5–10.6)
Chloride: 97 mmol/L — ABNORMAL LOW (ref 98–107)
Creatinine: 0.95 mg/dL (ref 0.67–1.17)
GFR: >60 mL/min
Glucose: 126 mg/dL — ABNORMAL HIGH (ref 70–99)
Potassium: 3.4 mmol/L — ABNORMAL LOW (ref 3.5–5.1)
Sodium: 141 mmol/L (ref 136–145)

## 2017-04-20 LAB — MAGNESIUM, BLOOD: Magnesium: 2 mg/dL (ref 1.6–2.4)

## 2017-04-20 LAB — PHOSPHORUS, BLOOD: Phosphorous: 3 mg/dL (ref 2.7–4.5)

## 2017-04-20 MED ORDER — SULFAMETHOXAZOLE-TRIMETHOPRIM 800-160 MG OR TABS
1.0000 | ORAL_TABLET | Freq: Two times a day (BID) | ORAL | 0 refills | Status: AC
Start: 2017-04-20 — End: 2017-04-21

## 2017-04-20 NOTE — Discharge Summary (Signed)
[facility]   Discharge Summary      Date Today:  04/20/17     Patient Name:  Pedro Shannon  MRN:    [id number]  PCP:   Cleopatra Cedar  Attending:   Cira Servant  Service:   Urology    Principal Diagnosis (required): Bladder perforation    Hospital Problem List (required):  Active Hospital Problems    Diagnosis    Ileus (CMS-HCC) [K56.7]      Resolved Hospital Problems   No resolved problems to display.     Past Medical History:   Diagnosis Date    Prostate cancer (CMS-HCC)      Past Surgical History:   Procedure Laterality Date    NASAL SEPTOPLASTY      R inguinal hernia      SUPRAPUBIC TUBE PLACEMENT         Additional Hospital Diagnoses ("rule out" or "suspected" diagnoses, etc.):  Postoperative ileus    Principal Procedure During This Hospitalization (required):   Suprapubic tube placement.     Other Procedures Performed During This Hospitalization (required):  X-ray Abdomen Single View    Result Date: 04/16/2017  Narrative: EXAM DESCRIPTION: X-RAY ABDOMEN SINGLE VIEW CLINICAL HISTORY: s/p NG placemnet COMPARISON: Abdominal radiograph, 04/16/2017 TECHNIQUE: Single supine radiograph of the abdomen. FINDINGS: See impression. CONCURRENT SUPERVISION: I have reviewed the images and agree with the resident interpretation.    Signed by: Blain Pais 04/16/2017 17:22:25    Impression: IMPRESSION: 1. Suction-type enteric tube, coursing below the diaphragm, with side-port projecting over the gastroesophageal juncture, with tip projecting over the gastric body. Recommend slight advancement. 2. Interval decrease in gaseous distension of multiple previously seen dilated loops of bowel. 3. No other change since prior.     X-ray Abdomen Complete W/chest 1 View    Result Date: 04/16/2017  Narrative: EXAM DESCRIPTION:  X-RAY ABDOMEN COMPLETE W/CHEST 1 VIEW CLINICAL HISTORY: abd pain, vomiting COMPARISON: CT abdomen pelvis, 04/13/2017 TECHNIQUE: Frontal chest radiograph. Supine and upright  radiographs of the abdomen. FINDINGS: Bowel: Multiple gas-filled dilated loops of small bowel, with paucity of colonic bowel gas, suggesting obstruction versus ileus.  Moderate volume of stool throughout the colon. Calcifications: Unremarkable Bones: No acute osseous abnormalities. Multi-level spine degenerative changes. Lung bases: Right midlung linear opacity, likely atelectasis or scarring. Otherwise clear lungs. No pleural effusion or pneumothorax. Unremarkable cardiomediastinal silhouette. Other: No pneumoperitoneum. Extensive subcutaneous emphysema, similar to prior CT of 04/13/2017. Suprapubic catheter. Right inguinal hernia repair clips. CONCURRENT SUPERVISION: I have reviewed the images and agree with the resident interpretation.    Signed by: Blain Pais 04/16/2017 09:04:24    Impression: IMPRESSION: 1. Multiple gas-filled dilated loops of small bowel, with paucity of colonic bowel gas, suggesting obstruction versus ileus. 2. No pneumoperitoneum. 3.  Extensive subcutaneous emphysema, similar to prior CT of 04/13/2017. 4. No acute cardiopulmonary disease.     X-ray Abdomen Complete W/chest 1 View    Result Date: 04/13/2017  Narrative: EXAM DESCRIPTION:  X-RAY ABDOMEN COMPLETE W/CHEST 1 VIEW CLINICAL HISTORY: Abdominal pain COMPARISON: None available. FINDINGS: Fair inspiratory effort. Bibasilar subsegmental atelectasis. Cardiomediastinal silhouette grossly unremarkable. Free intraperitoneal air as well as diffuse subcutaneous emphysema throughout the abdomen and pelvis. Gas is noted in the colon to the region of the sigmoid. Multiple air-filled small bowel loops centrally suggest ileus. Catheter noted in the region of the bladder.    Signed by: Lauralee Evener 04/13/2017 13:22:46    Impression: IMPRESSION: Free intraperitoneal air and extensive subcutaneous emphysema  3 days postoperative. Patient is undergoing a CT concurrently for excluding bladder rupture. Likely postoperative ileus.    Ct Abdomen And Pelvis  W/o Contrast    Result Date: 04/13/2017  Narrative: EXAM DESCRIPTION: CT ABDOMEN AND PELVIS W/O CONTRAST CLINICAL HISTORY: Recent history of robotic assisted laparoscopic bladder diverticulectomy 04/10/2017. Abdominal distension and decreased output. TECHNIQUE: COVERAGE: Abdomen and pelvis IV CONTRAST: None PHASES ACQUIRED: Noncontrast abdomen and pelvis before filling of the urinary bladder with 200 cc of dilute solution of Omnipaque 350 contrast. Subsequent imaging of the pelvis was performed. POSITIVE ORAL CONTRAST GIVEN: None ADVERSE EVENTS: None RECONSTRUCTIONS: Axial 3.65mm and sagittal/coronal 17mm Up-to-date CT equipment and radiation dose reduction techniques were employed. CTDIvol: 17.5 - 18.0 mGy. DLP: 7893 mGy-cm. COMPARISON: MRI pelvis 08/17/2015 FINDINGS: LUNG BASES: Bilateral dependent consolidation/atelectasis. LIVER: Unremarkable. BILIARY: Unremarkable. No intrahepatic or extrahepatic biliary ductal dilatation. SPLEEN: Unremarkable. PANCREAS: Unremarkable. ADRENALS: Unremarkable. KIDNEYS: Unremarkable. STOMACH & DUODENUM: Unremarkable. VASCULATURE: Unremarkable LYMPHATIC: Unremarkable SMALL & LARGE BOWEL: Colonic diverticula without evidence of diverticulitis. BLADDER/PELVIC ORGANS: Foley catheter with Foley balloon within the prostatic urethra. There is extravasation of contrast instilled within the bladder seen along the right lateral aspect of the bladder with communication into the fluid seen within the intraperitoneal space (coronal series 604, image 63), which primarily tracks along the right paracolic gutter and extends to the hepatic dome. Additional area of contrast extravasation seen more posteriorly along the right posterolateral aspect of the bladder (series 5, axial image 67). BONES/SOFT TISSUES: Unremarkable OTHER: Multiple foci of pneumoperitoneum. Extensive subcutaneous emphysema involving the abdominal wall and pelvis, extending superiorly into the chest wall, likely postprocedural.  CONCURRENT SUPERVISION: I have reviewed the images and agree with the resident interpretation. DOSE STATEMENT: "[place] CT scanners employ modern techniques for CT dose reduction, including protocol review, automatic exposure control, and iterative reconstruction techniques. These features assure that radiation dose levels in CT are optimized and are consistent with state-of-the-art, low dose CT practice."    Preliminary created by: Andre Lefort Signed by: Lauralee Evener 04/13/2017 14:55:35    Impression: IMPRESSION: CT Cystogram 1. Focal areas of bladder perforation along the right side of the bladder, characterized by extravasation of contrast along the right lateral and right posterolateral aspects of the bladder, communicating into fluid seen within the intraperitoneal space. Additional scattered foci of pneumoperitoneum. 2. Foley catheter balloon is within the prostatic urethra. Repositioning is recommended. 3. Extensive subcutaneous emphysema involving the chest and abdominal wall, likely postprocedural. Critical Results:  Significant findings delineated above were seen at 13:41 on 04/13/2017 and verbally communicated by Andre Lefort to DR. MOSTAMAND At 13:41 on 04/13/2017.   CTRM:2001:verbal.     Ct Abdomen And Pelvis With Contrast    Result Date: 04/16/2017  Narrative: EXAM DESCRIPTION: CT ABDOMEN AND PELVIS WITH CONTRAST CLINICAL HISTORY: Nausea, vomiting, and diarrhea status post bladder diverticulectomy complicated by bladder perforation TECHNIQUE: COVERAGE: Abdomen and pelvis IV CONTRAST: 100 ml Omnipaque 350 PHASE(S) ACQUIRED: Portal venous POSITIVE ORAL CONTRAST GIVEN: No ADVERSE EVENTS: None RECONSTRUCTIONS: Axial 3.41mm and sagittal/coronal 66mm Up-to-date CT equipment and radiation dose reduction techniques were employed. CTDIvol: 7.2 - 20.2 mGy. DLP: 1200 mGy-cm. COMPARISON: Abdomen and pelvis CT from 04/13/2017 FINDINGS: LUNG BASES: Right middle and left lower lobe ground-glass  opacification and consolidation. Subsegmental atelectasis in the right lower lobe. LIVER:Unremarkable BILIARY:Unremarkable PANCREAS: Unremarkable SPLEEN: Unremarkable ADRENAL GLANDS: Unremarkable KIDNEYS: Bilateral parapelvic cysts. STOMACH/DUODENUM: Distended stomach and duodenum. 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 Mild atherosclerotic calcifications.  LYMPHATIC: Unremarkable SMALL & LARGE BOWEL: Diffuse small bowel distension without transition point. Normal appendix. Descending and sigmoid colon diverticulosis. BLADDER/PELVIC ORGANS: Bladder is decompressed with a suprapubic catheter. Small amount of fluid along the right aspect of the bladder. BONES/SOFT TISSUES: Multilevel degenerative spine changes are worst at L4-5. Stable left femoral neck lucent lesion. Trace pneumoperitoneum. Extensive subcutaneous emphysema. OTHER: Bilateral fat-containing inguinal hernias. CONCURRENT SUPERVISION: I have reviewed the images and agree with the resident interpretation. DOSE STATEMENT: "[place] CT scanners employ modern techniques for CT dose reduction, including protocol review, automatic exposure control, and iterative reconstruction techniques. These features assure that radiation dose levels in CT are optimized and are consistent with state-of-the-art, low dose CT practice."    Preliminary created by: Hilarie Fredrickson Signed by: Blain Pais 04/16/2017 11:22:12    Impression: IMPRESSION: Diffuse small bowel distension likely represents ileus. Distal small bowel obstruction is also possible but less likely. Small amount of fluid along the right aspect of the bladder is again seen where urine leak was previously demonstrated. Extensive subcutaneous emphysema and trace pneumoperitoneum are redemonstrated. 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 Right middle and left lower lobe ground-glass opacification and consolidation may represent aspiration or pneumonia.      US Kidney Complete    Result Date: 04/11/2017  Narrative: EXAM DESCRIPTION: US KIDNEY COMPLETE CLINICAL HISTORY: Elevated creatinine status post robotic diverticulectomy. TECHNIQUE: Per Protocol including inflow and outflow with real time grey scale, color and spectral duplex doppler. COMPARISON: None available FINDINGS: RIGHT KIDNEY: 10.54 cm in long axis. No hydronephrosis or renal calculi. LEFT KIDNEY: 10.43 cm in long axis. Limited visibility of the left kidney. BLADDER: Not visualized. Patient noted to have Foley. Pneumoperitoneum limits evaluation of the abdomen. CONCURRENT SUPERVISION: I have reviewed the images and agree with the resident's interpretation.    Preliminary created by: Maury Dus Signed by: Talbert Forest 04/11/2017 15:37:28    Impression: IMPRESSION: 1. Expected post operative pneumoperitoneum severely limits visualization of left kidney. No hydronephrosis is seen in the right kidney.     Ir Ct Guidance With Catheter    Result Date: 04/14/2017  Narrative: EXAM DESCRIPTION: IR CT GUIDANCE WITH CATHETER CLINICAL HISTORY: 67 year old male recently s/p robotic bladder diverticulectomy 04/10/17 with dysfunction of Foley catheter and bladder rupture. Patient requires suprapubic catheter for drainage. PROCEDURE: 1.  CT guided supra pubic catheter placement 2.  Moderate sedation was provided by an IR nurse under constant supervision of the Interventional Radiologist. Sedation time was 20 minutes. CONSENT: The risks benefits and alternatives to the procedure were discussed with the patient. The patient  verbalized understanding and the desire to proceed. Written informed consent was obtained. Sterile precautions were used. TECHNIQUE: Patient was placed in the supine position. Using intermittent CT guidance, a needle was inserted into the bladder. A short Amplatz wire was advanced and coiled in the collection. Following tract dilation, a 10.2 F MPD drainage catheter was advanced and secured  using sutures. 10 mL of blood tinged urine was removed and sent to the lab for analysis. The catheter was attached to Foley bag drainage. Complications: None OPERATORS: Staff: Octavio Manns, MD Fellow/Resident/PA/NP: Philmore Pali, MD CONTRAST: None MEDICATIONS: Versed 2 mg IV Fentanyl 100 mcg IV Local Lidocaine DOSE STATEMENT: Up-to-date CT equipment and radiation dose reduction techniques were employed. CTDIvol: 6.2 - 6.7 mGy. DLP: 259 mGy-cm. FINDINGS: Distended bladder with air and fluid. Marked subcutaneous emphysema also noted. Post placement CT demonstrates appropriate position of pigtail  catheter. CONCURRENT SUPERVISION: I was present for the procedure and have reviewed the images and report.    Preliminary created by: Linus Salmons Signed by: Danella Deis 04/14/2017 08:30:00    Impression: IMPRESSION: Successful CT guided suprapubic catheter placement with a 10.2 Fr drainage catheter. Plan: Pigtail catheter can be exchanged for a Foley or an IR placed MIC tube at a future date. Catheter should be exchanged every 8-12 weeks if it is required long term.       Consultations Obtained During This Hospitalization:  Interventional Radiology      Reason for Admission to the Hospital / History of Present Illness:  Kashden Deboy is a 67 year old male who has a past medical history of Prostate cancer (CMS-HCC). He was admitted with Ileus (CMS-HCC) [K56.7]. And found to have a malpositioned urethral catheter.     Hospital Course by Problem (required):  He underwent NGT decompression for his ileus. His cystogram showed a malpositioned urethral catheter and bladder perforation. He underwent SPT placement by IR. His ileus gradually improved and he passed his NGT gravity trial. The NGT was removed and he tolerated gentle advancement of his diet. He had return of bowel function with both flatus and bowel movements. His pain and distention improved. His SPT drained appropriately. He was felt ready for discharge on HD4. His SPT  will remain in and for one week and he will have a Xray cystogram prior to removal.     Tests Outstanding at Discharge Requiring Follow Up:  Xray cystogram    Discharge Condition (required):  Stable.  Key Physical Exam Findings at Discharge:  Gen: NAD, friendly, conversant, NGT in place with bilious output.  CV: Normal rate  L: Nonlabored breathing on RA  Abd: Minimally distended. Incisions are well healed, c/d/i. No erythema or e/o infection  GU: SPT in place, CYU.  Ext: No edema        Discharge Diet:    Diet Clear Liquid LIMITED to 8 OZ/8Hr shift    Discharge Medications:     What To Do With Your Medications      CHANGE how you take these medications      Add'l Info   sulfamethoxazole-trimethoprim 800-160 MG tablet  Commonly known as:  BACTRIM DS, SEPTRA DS  Take 1 tablet by mouth 2 times daily for 2 doses. Start next week on the evening prior to your scheduled x-ray.   Quantity:  2 tablet  Refills:  0  What changed:  additional instructions        CONTINUE taking these medications      Add'l Info   acetaminophen 325 MG tablet  Commonly known as:  TYLENOL  Take 2 tablets (650 mg) by mouth every 6 hours as needed for Mild Pain (Pain Score 1-3).   Quantity:  30 tablet  Refills:  0     docusate sodium 100 MG capsule  Commonly known as:  COLACE  Take 1 capsule (100 mg) by mouth 2 times daily.   Quantity:  60 capsule  Refills:  0     HYDROcodone-acetaminophen 5-325 MG tablet  Commonly known as:  NORCO  Take 0.5 tablets by mouth every 8 hours as needed for Severe Pain (Pain Score 7-10) or Breakthrough Pain.   Quantity:  8 tablet  Refills:  0           Where to Get Your Medications      These medications were sent to [facility] [place] - [id number]  [address], [place] [address]    Hours:  Mon-Fri 8am-10pm, Sat 9am-6pm, Sun 10am-6pm Phone:  [phone]    sulfamethoxazole-trimethoprim 800-160 MG tablet         Allergies:  Allergies   Allergen Reactions     Dutasteride Hives    Rogaine [Minoxidil] Hives       Discharge Disposition:  Home.    Discharge Code Status: No orders of the defined types were placed in this encounter.    This code status is not changed from the time of admission.    Follow Up Appointments:  Scheduled appointments:  Future Appointments   Date Time Provider [place]   04/23/2017  3:45 PM Kader, [place]       For appointments requested for after discharge that have not yet been scheduled, refer to the Post Discharge Referrals section of the After Visit Summary.

## 2017-04-20 NOTE — Interdisciplinary (Signed)
Pt is  Wheeled  Down to the lobby by the volunteer  . Pt is  Alert / oriented x 4 .

## 2017-04-20 NOTE — Plan of Care (Signed)
Problem: Promotion of Health and Safety  Goal: Promotion of Health and Safety  The patient remains safe, receives appropriate treatment and achieves optimal outcomes (physically, psychosocially, and spiritually) within the limitations of the disease process by discharge.    Information below is the current care plan.   04/20/17 0740 04/20/17 1035   Adult/Peds Plan of Care   Guidelines --  Inpatient Nursing Guidelines   Individualized Interventions/Recommendations #1 --  Tolerating PO clear liquid.    Individualized Interventions/Recommendations #2 (if applicable) --  Emptying suprapubic catheter by self aswell as urinating in bathroom.   Individualized Interventions/Recommendations #3 (if applicable) --  Bowel sounds present.    Outcome Evaluation (rationale for progressing/not progressing) every shift --  Progressing to goal, D/C Home today   Patient /Family stated Goal   Patient /Family stated Goal go home today --

## 2017-04-20 NOTE — Interdisciplinary (Signed)
discharged instruction given to pt , instruction given  regarding diet ,medication , activity , follow up appointment and what complications to watch for   ,  Peripheral  Line discontinued . All of pt's  Belongings are with her none is  Missing

## 2017-04-20 NOTE — Discharge Instructions (Signed)
Small Bowel Obstruction  A small bowel obstruction is a blockage in the small bowel. The small bowel, which is also called the small intestine, is a long, slender tube that connects the stomach to the colon. When a person eats and drinks, food and fluids go from the stomach to the small bowel. This is where most of the nutrients in the food and fluids are absorbed.  A small bowel obstruction will prevent food and fluids from passing through the small bowel as they normally do during digestion. The small bowel can become partially or completely blocked. This can cause symptoms such as abdominal pain, vomiting, and bloating. If this condition is not treated, it can be dangerous because the small bowel could rupture.  What are the causes?  Common causes of this condition include:   Scar tissue from previous surgery or radiation treatment.   Recent surgery. This may cause the movements of the bowel to slow down and cause food to block the intestine.   Hernias.   Inflammatory bowel disease (colitis).   Twisting of the bowel (volvulus).   Tumors.   A foreign body.   Slipping of a part of the bowel into another part (intussusception).    What are the signs or symptoms?  Symptoms of this condition include:   Abdominal pain. This may be dull cramps or sharp pain. It may occur in one area, or it may be present in the entire abdomen. Pain can range from mild to severe, depending on the degree of obstruction.   Nausea and vomiting. Vomit may be greenish or a yellow bile color.   Abdominal bloating.   Constipation.   Lack of passing gas.   Frequent belching.   Diarrhea. This may occur if the obstruction is partial and runny stool is able to leak around the obstruction.    How is this diagnosed?  This condition may be diagnosed based on a physical exam, medical history, and X-rays of the abdomen. You may also have other tests, such as a CT scan of the abdomen and pelvis.  How is this treated?  Treatment for this  condition depends on the cause and severity of the problem. Treatment options may include:   Bed rest along with fluids and pain medicines that are given through an IV tube inserted into one of your veins. Sometimes, this is all that is needed for the obstruction to improve.   Following a simple diet. In some cases, a clear liquid diet may be required for several days. This allows the bowel to rest.   Placement of a small tube (nasogastric tube) into the stomach. When the bowel is blocked, it usually swells up like a balloon that is filled with air and fluids. The air and fluids may be removed by suction through the nasogastric tube. This can help with pain, discomfort, and nausea. It can also help the obstruction to clear up faster.   Surgery. This may be required if other treatments do not work. Bowel obstruction from a hernia may require early surgery and can be an emergency procedure. Surgery may also be required for scar tissue that causes frequent or severe obstructions.    Follow these instructions at home:   Get plenty of rest.   Follow instructions from your health care provider about eating restrictions. You may need to avoid solid foods and consume only clear liquids until your condition improves.   Take over-the-counter and prescription medicines only as told by your health care provider.  Keep all follow-up visits as told by your health care provider. This is important.  Contact a health care provider if:   You have a fever.   You have chills.  Get help right away if:   You have increased pain or cramping.   You vomit blood.   You have uncontrolled vomiting or nausea.   You cannot drink fluids because of vomiting or pain.   You develop confusion.   You begin feeling very dry or thirsty (dehydrated).   You have severe bloating.   You feel extremely weak or you faint.  This information is not intended to replace advice given to you by your health care provider. Make sure you discuss any  questions you have with your health care provider.    You need to schedule an X-ray Cystogram prior to your follow-up. We are going to change your followup to next Friday instead of next Tuesday. This xray needs to be done prior to that appointment.     You be prescribed an antibiotic to take starting the evening before your scheduled x-ray study.     Document Released: 05/01/2005 Document Revised: 10/10/2015 Document Reviewed: 04/08/2014  [employer]vier Interactive Patient Education  [employer].

## 2017-04-22 ENCOUNTER — Telehealth (HOSPITAL_BASED_OUTPATIENT_CLINIC_OR_DEPARTMENT_OTHER): Payer: Self-pay

## 2017-04-22 NOTE — Progress Notes (Signed)
Reason for visit:   No chief complaint on file.    Demographics:        Date: 03/02/16       Patient Name: Pedro Shannon       Medical Record #: [id number]       DOB: Feb 14, 1951       Age: 67 year old       Sex: male    Pedro Shannon, Pedro Shannon STE Pedro Shannon, Pedro Shannon [id number]  Pedro Shannon    HPI:    Pedro Shannon is a 67 year old male with a h/o BPH w/LUTS and no family hx of prostate cancer presenting for reassessment of lower urinary tract symptoms status post REZUM 07/18/2016, incontinence, bladder diverticula, prostate cancer on active surveillance now status post bladder diverticulectomy x2 from 04/10/2017 for benign disease.  Of note, the patient had a difficult time with the Foley catheter postop and subsequently went on to obstruct thus causing a leak from the bladder with resulting ileus.    Since discharge the patient has been eating and drinking well, he denies any fevers, flank pain, nausea or vomiting.  He has a suprapubic in place and states that occasionally this will obstructed and he will void per urethra.    The reports his symptoms are improved after Rezum. He reports significantly improved frequency every 1-2 hours in the day, and reduced nocturia to 0-4 a night fr. He does report persistent urgency with some occasional drips of incontinence associated with the urgency  reports a medium to good stream, and no straining or hesitancy. He denies hematuria or dysuria. On Flomax 0.8 mg daily SS: 23/5    He also has 2 posterior bladder diverticula visualized on cysto during Rezum that the pt is interested in treating eventually.    He also has a h/o elevated PSA, prostate nodule in the setting of a suspicious MRI, now SP MRI biopsy 10/28/15 for a small focus of Gleason 6 disease. Most recent PSA 05/08/16 was 2.12.    LUTS; has urethral catheter      [place] [facility] IPSS REVIEW FS 10/12/2016 08/30/2016 06/07/2016 11/10/2015   Incomplete Emptying 4 3 4 3    Frequency 5 2 4 3    Intermittencey 2 2 4   0   Urgency 4 3 4 2    Weak Stream 4 1 4 1    Straining 2 1 3  0   Nocturia 4 3 3 3    IPSS Total Score 25 15 26 12    Quality of Life 6 4 5 5        He is sexually active on bi-mix with good results. Having some am erections.    No new onset bone pain, night sweats, fevers, chills or unintended weight loss.    Lab Results   Component Value Date/Time    PSA 2.12 05/08/2016 01:15 PM    PSA 2.92 07/26/2015 03:04 PM        PSA 3.2 01/09/16.    FINAL PATHOLOGIC DIAGNOSIS 10/28/15 reviewed personally:  A: Prostate, right lateral apex, biopsy  -Benign prostatic tissue.  -See comment.  B: Prostate, right medial apex, biopsy       -Benign prostatic tissue.  C: Prostate, right lateral mid, biopsy       -Benign fibromuscular tissue.  D: Prostate, right medial mid, biopsy       -Benign prostatic tissue.       -See comment.  E: Prostate, right lateral base,  biopsy       -Benign prostatic tissue.  F: Prostate, right medial base, biopsy       -Benign prostatic tissue.  G: Prostate, left lateral apex, biopsy       -Benign prostatic tissue.  H: Prostate, left medial apex, biopsy       -Benign prostatic tissue with mild acute inflammation.  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  -See comment.  J: Prostate, left medial mid, biopsy       -Benign prostatic tissue.  K: Prostate, left lateral base, biopsy       -Benign prostatic tissue.  L: Prostate, left medial base, biopsy       -Benign prostatic tissue.       -Seminal vesicle/ejaculatory duct tissue identified.  M: Prostate, target #1, biopsy       -Benign prostatic tissue.  N: Prostate, target #2, biopsy  -Prostatic tissue with a small focus of atypical glands, cannot exclude  prostatic adenocarcinoma.       -See comment.    MRI PELVIS WO/W CONTRAST 08/25/15 reviewed personally:  1. Two PI-RADS 3 lesions, in the left prostatic transition zone mid-gland to   base, and  in the right prostatic mid-gland, both possibly also involving the   peripheral zone.    2. No definite extra-prostatic extension.    3. Multiple incidental findings including bilateral renal hernias, bladder   diverticula, bladder wall thickening, and colonic diverticuli.    Outside Records:  PSA  07/15/15 - 2.4  01/06/15 - 4.3  07/01/14 - 3.0  11/19/13 - 3.7  (% free 9%)  12/19/11 - 3.4  08/03/11 - 3.3  12/18/10 - 3.1  06/17/10 - 3.0    4K Score 02/26/15:    24% - high risk    Pathology 06/16/15:  A.  L Base  Atypical small acinar proliferation  B.  L Lateral base  Benign prostatic tissue  C.  L Mid  Atypical small acinar proliferation  D.  L Lateral Mid  Benign prostatic tissue  E.  L Apex  Benign prostatic tissue  F.  L Lateral Apex  Benign prostatic tissue  G.  R Base  Benign prostatic tissue  H.  R Lateral base  Benign prostatic tissue  I.  R Mid  Atypical small acinar proliferation  J.  R Lateral Mid  Benign prostatic tissue  K.  R Apex  Benign prostatic tissue  L.  R Lateral Apex  Benign prostatic tissue    MRI 05/30/15:  PIRADS 4 lesion involving left base with possible extracapsular extension     Review of Systems    \    Wellbeing assessment:  Wellbeing screening reviewed.    Pt indicated no needs.    Past Medical History:   Diagnosis Date    Prostate cancer (CMS-HCC)        Past Surgical History:   Procedure Laterality Date    NASAL SEPTOPLASTY      R inguinal hernia      SUPRAPUBIC TUBE PLACEMENT         Allergies   Allergen Reactions    Dutasteride Hives    Rogaine [Minoxidil] Hives       Current Outpatient Medications   Medication Sig Dispense Refill    acetaminophen (TYLENOL) 325 MG tablet TAKE TWO TABLETS BY MOUTH EVERY SIX HOURS AS  NEEDED FOR MILD PAIN 30 each 0    docusate sodium (COLACE) 100 MG capsule TAKE ONE CAPSULE BY MOUTH TWICE DAILY 60 each 0    HYDROcodone-acetaminophen (NORCO) 5-325 MG tablet TAKE ONE-HALF TABLET BY MOUTH EVERY EIGHT HOURS AS NEEDED FOR SEVERE PAIN OR BREAKTHROUGH PAIN 8  each 0    phenazopyridine (PYRIDIUM) 200 MG tablet TAKE ONE TABLET BY MOUTH THREE TIMES DAILY AS NEEDED FOR DYSURIA 21 each 0    sulfamethoxazole-trimethoprim (BACTRIM DS, SEPTRA DS) 800-160 MG tablet TAKE ONE TABLET BY MOUTH TWICE DAILY FOR 2 DOSES STARTING NEXT WEEK ON MORNING OF CATHETER REMOVAL DAY 2 each 0     No current facility-administered medications for this visit.        Social History     Socioeconomic History    Marital status: Married     Spouse name: Not on file    Number of children: Not on file    Years of education: Not on file    Highest education level: Not on file   Social Needs    Financial resource strain: Not on file    Food insecurity - worry: Not on file    Food insecurity - inability: Not on file    Transportation needs - medical: Not on file    Transportation needs - non-medical: Not on file   Occupational History    Occupation: Chief Executive Officer   Tobacco Use    Smoking status: Never Smoker    Smokeless tobacco: Never Used   Substance and Sexual Activity    Alcohol use: No    Drug use: No    Sexual activity: Not on file   Other Topics Concern    Not on file   Social History Narrative    Not on file       Physical Exam:   There were no vitals filed for this visit.    GENERAL: The patient is alert, cooperative and in no acute distress.   HEENT: normalcephalic/atraumatic  NECK: midline trachea. No jugular venous distention  PULM: no evidence of labored breathing  GI: soft, NT, ND, no massess.  No hepatosplenomegaly  BACK: No CVAT  EXTREMITES: Joints are normal without redness or swelling.   SKIN: There is no edema or cyanosis.   NEURO: no motor or sensory deficits.  Alert and Oriented x 3    Labs/Diagnostic X-rays:  Lab Results   Component Value Date    WBC 8.2 04/17/2017    RBC 3.96 (L) 04/17/2017    HGB 12.9 (L) 04/17/2017    HCT 38.8 (L) 04/17/2017    MCV 98.0 (H) 04/17/2017    MCHC 33.2 04/17/2017    RDW 14.0 04/17/2017    PLT 277 04/17/2017    MPV 9.8 04/17/2017    LYMPHS 11  04/17/2017    MONOS 10 04/17/2017    EOS 1 04/17/2017    BASOS 0 04/17/2017       Lab Results   Component Value Date    BUN 19 04/20/2017    CREAT 0.95 04/20/2017    CL 97 (L) 04/20/2017    NA 141 04/20/2017    K 3.4 (L) 04/20/2017    [place] 8.0 (L) 04/20/2017    TBILI 1.20 (H) 04/16/2017    ALB 3.8 04/16/2017    TP 7.3 04/16/2017    AST 21 04/16/2017    ALK 59 04/16/2017    BICARB 30 (H) 04/20/2017    ALT 21 04/16/2017    GLU 126 (  H) 04/20/2017       Lab Results   Component Value Date    PSA 2.12 05/08/2016       No results found for: TESTOSTERONE    Lab Results   Component Value Date    COLORUA Amber 04/16/2017    APPEARUA Hazy 04/16/2017    GLUCOSEUA Negative 04/16/2017    BILIUA Negative 04/16/2017    KETONEUA 1+ (A) 04/16/2017    SGUA 1.026 04/16/2017    BLOODUA 3+ (A) 04/16/2017    PHUA 6.0 04/16/2017    PROTEINUA 2+ (A) 04/16/2017    UROBILUA Negative 04/16/2017    NITRITEUA Positive (A) 04/16/2017    LEUKESTUA 2+ (A) 04/16/2017    WBCUA >50 (A) 04/16/2017    RBCUA >50 (A) 04/16/2017         Assessment/Plan:   67 year old gentleman status post robotic assisted laparoscopic diverticulectomy for benign disease.  We will cap his suprapubic tube today and he is to follow up in 1 week for a nursing visit for a trial of void.    I would like to see him back in follow-up in 6 weeks.    Thank you for allowing me to participate in this patient's care.   Sincerely,   Patty Sermons Bebe Moncure

## 2017-04-22 NOTE — Telephone Encounter (Addendum)
Transitional Telephonic Nurse  (TTN) Post-Discharge Follow-Up Phone Call   Problems Identified: Follow-Up    TTN called pt and pt was so happy to hear from TTN. Pt stated that he needs an appt for cystogram.  9:26 am - TTN called Imaging scheduling to get an appt for pt's cystogram before seeing Dr.Kader on 04/23/17. Pt was instructed to have cystogram completed and abt taken evening before the procedure. Phone line said there is a 72 min wait time, TTN opted to have someone from scheduling to call TTN back. Pt said that he is doing fine, the catheter is working properly, no complaints at this time just needed assistance with scheduling cystogram. TTN unable to review AVS. TTN will call pt back.    10:46 am - TTN called Imaging scheduling again, TTN received an automated callback but the phone call became disconnected. Automated machine stated that there is a 10 minute wait time, so TTN opted to wait.    11:02 am - Magda Paganini answered the phone call, Magda Paganini was able to get pt an appt for tomorrow, 04/23/17 at 1 pm for Cystogram    11:10 am - TTN called pt back and updated pt of his appts tomorrow as stated above and confirmed appt time w/ Dr. Nani Skillern is at 3:45 pm tomorrow which is different than what pt knew at 2 pm. Pt stated that he has mild ab discomfort, just started eating solid food, TTN reminded pt that per AVS, he is to have a liquid diet, pt replied that he keeps in contact with Dr. Nani Skillern and Dr. Andrey Spearman him eating solid food. Pt declined review of old meds but reassured TTN that he has his new med - abt and pt knows how and when to take it.     Wetzel Bjornstad, BSN, RN, [facility]  Transitional Telephonic Nursing  [facility]  [email or web address]  [phone]

## 2017-04-22 NOTE — Telephone Encounter (Signed)
Spoke to pt - confirmed appt w/ Dr. Nani Skillern tomorrow 02/26. Pt states he also scheduled xray at 1 PM, should be enough time for results prior to MD appt.

## 2017-04-23 ENCOUNTER — Ambulatory Visit
Admission: RE | Admit: 2017-04-23 | Discharge: 2017-04-23 | Disposition: A | Discharge status: Home / Self Care | Payer: Medicare Other | Attending: Body Imaging | Admitting: Body Imaging

## 2017-04-23 ENCOUNTER — Ambulatory Visit (HOSPITAL_BASED_OUTPATIENT_CLINIC_OR_DEPARTMENT_OTHER): Payer: Medicare Other | Admitting: Urology

## 2017-04-23 ENCOUNTER — Encounter (HOSPITAL_BASED_OUTPATIENT_CLINIC_OR_DEPARTMENT_OTHER): Payer: Self-pay | Admitting: Urology

## 2017-04-23 VITALS — BP 117/72 | HR 96 | Temp 97.5°F | Resp 16 | Ht 72.0 in | Wt 185.0 lb

## 2017-04-23 DIAGNOSIS — Z09 Encounter for follow-up examination after completed treatment for conditions other than malignant neoplasm: Secondary | ICD-10-CM

## 2017-04-23 DIAGNOSIS — N323 Diverticulum of bladder: Secondary | ICD-10-CM

## 2017-04-23 DIAGNOSIS — T80818A Extravasation of other vesicant agent, initial encounter: Principal | ICD-10-CM | POA: Insufficient documentation

## 2017-04-23 MED ORDER — IOTHALAMATE MEGLUMINE 17.2 % UR SOLN
200.0000 mL | Freq: Once | URETHRAL | Status: AC
Start: 2017-04-23 — End: 2017-04-23
  Administered 2017-04-23: 200 mL
  Filled 2017-04-23: qty 200

## 2017-04-23 NOTE — Patient Instructions (Signed)
A Karim Kader, MD, phD, FRCSC  Professor of Surgery  Department of Urology    Nurse  Phone:  [phone]  Please note my phone number is for for non-urgent   clinical questions that can be answered in the next 24 hours.    For Prescription refills please call [phone].        Administrative Assistant  Jessica [phone]  Fax [phone]    To schedule a future appointment call [phone]            [facility] - Urology  Monday-Friday 8:00- 5:00p.m. (Closed Holidays and Weekends)    AFTER HOURS EMERGENCY NUMBER                  [phone]  Ask for the Urologist/Oncologist Physician On-Call.        Infusion Center [phone]    Call to schedule, cancel, or reschedule chemotherapy appointments.  Infusion Center hours are Monday - Friday 07:30 - 9:30 and   Holidays and Weekends are 8am-6pm    Radiation Oncology [phone]    Call to schedule, cancel, or reschedule radiation appointments    To Schedule MRI  [phone] or [phone]     Please contact the [facility] Scheduling line at   [phone] to schedule your US, CT Scans or studies.      Social Worker: Yuko Abbott, LCSW   [phone]     [facility]  [phone]

## 2017-04-29 ENCOUNTER — Telehealth (HOSPITAL_BASED_OUTPATIENT_CLINIC_OR_DEPARTMENT_OTHER): Payer: Self-pay | Admitting: Urology

## 2017-04-29 NOTE — Telephone Encounter (Signed)
Pt is calling in regards to catheter removal. Pt also has questions of the actual procedure of removal. Pt would like to speak with nurse. Please advise .    [phone]

## 2017-04-30 NOTE — Telephone Encounter (Signed)
Patient called back, needs to make an appointment with the nurse to have his catheter taken out. Gave him the number to call for that appointment.

## 2017-05-01 ENCOUNTER — Encounter (HOSPITAL_BASED_OUTPATIENT_CLINIC_OR_DEPARTMENT_OTHER): Payer: Self-pay | Admitting: Adult Health

## 2017-05-01 ENCOUNTER — Ambulatory Visit: Payer: Medicare Other

## 2017-05-01 DIAGNOSIS — N323 Diverticulum of bladder: Secondary | ICD-10-CM

## 2017-05-01 NOTE — Progress Notes (Signed)
Nurse order placed on behalf of Dr. Nani Skillern for removal of SP tube.

## 2017-05-01 NOTE — Interdisciplinary (Signed)
Patient seen in clinic for SPT removal.  Pt states he has been voiding well through urethra without difficulty with SPT plugged most of the time.  Noted SPT is not a foley catheter.  Per nurse manager Marlowe Kays RN, IR consulted for SPT removal and nurse from IR came to see pt and performed removal of SPT without difficulty.  Dressing applied and aftercare and ED precautions reviewed, pt verbalized understanding.

## 2017-05-01 NOTE — Addendum Note (Signed)
Addended by: Bufford Spikes on: 05/01/2017 08:38 AM     Modules accepted: Orders

## 2017-05-10 NOTE — Addendum Note (Signed)
Addended by: Bufford Spikes on: 05/10/2017 01:20 PM     Modules accepted: Orders

## 2017-05-17 ENCOUNTER — Telehealth (HOSPITAL_BASED_OUTPATIENT_CLINIC_OR_DEPARTMENT_OTHER): Payer: Self-pay | Admitting: Urology

## 2017-05-17 ENCOUNTER — Other Ambulatory Visit: Payer: Medicare Other | Attending: Urology

## 2017-05-17 DIAGNOSIS — N390 Urinary tract infection, site not specified: Secondary | ICD-10-CM

## 2017-05-17 DIAGNOSIS — N400 Enlarged prostate without lower urinary tract symptoms: Secondary | ICD-10-CM | POA: Insufficient documentation

## 2017-05-17 DIAGNOSIS — A498 Other bacterial infections of unspecified site: Secondary | ICD-10-CM

## 2017-05-17 LAB — URINALYSIS
Bilirubin: NEGATIVE
Blood: NEGATIVE
Glucose: NEGATIVE
Ketones: NEGATIVE
Nitrite: NEGATIVE
Specific Gravity: 1.017 (ref 1.002–1.030)
Urobilinogen: NEGATIVE
WBC: >50 — AB (ref 0–?)
pH: 6 (ref 5.0–8.0)

## 2017-05-17 NOTE — Telephone Encounter (Signed)
Pt is calling to request a Urine Culture to be ordered and placed in his chart. Pt states that he thinks he has a UTI and would like to do a test as soon as possible. Pt is requesting call back to confirm order was placed. Please advise.     [phone]

## 2017-05-17 NOTE — Telephone Encounter (Signed)
Spoke to pt, he is complaining of dysuria, spraying stream, cloudy urine, cold but no fever/chills. Denies hematuria, flank pain. He took Augmentin that he had around the house for a couple weeks which helped; once med was stopped, pt's symptoms returned. Will route to NP to advise.

## 2017-05-17 NOTE — Telephone Encounter (Signed)
Spoke to pt, NP ordered UA/[place]. Pt will get this done today, he is aware to call the on call urologist this weekend if symptoms get worse or if he develops flu like symptoms. Will watch for [place] next week. No further questions at this time.

## 2017-05-19 LAB — URINE CULTURE

## 2017-05-20 MED ORDER — SULFAMETHOXAZOLE-TRIMETHOPRIM 800-160 MG OR TABS
1.0000 | ORAL_TABLET | Freq: Two times a day (BID) | ORAL | 0 refills | Status: AC
Start: 2017-05-20 — End: 2017-05-27

## 2017-05-20 NOTE — Telephone Encounter (Signed)
Spoke to pt, he is still having dysuria, cloudiness in urine. Will page NP for orders.

## 2017-05-20 NOTE — Telephone Encounter (Signed)
Pt is aware Bactrim was sent to pharmacy.

## 2017-05-20 NOTE — Telephone Encounter (Signed)
Left v/m for pt to call me back for updates regarding symptoms. [place] positive. Once pt calls back I will forward to NP for advisement.

## 2017-05-20 NOTE — Addendum Note (Signed)
Addended by: Lear Ng on: 05/20/2017 10:17 AM     Modules accepted: Orders

## 2017-06-05 ENCOUNTER — Telehealth (HOSPITAL_BASED_OUTPATIENT_CLINIC_OR_DEPARTMENT_OTHER): Payer: Self-pay | Admitting: Urology

## 2017-06-05 NOTE — Telephone Encounter (Signed)
error 

## 2017-06-05 NOTE — Telephone Encounter (Signed)
Per RN, schedule pt for follow up w/ Dr. Nani Skillern to discuss post op sx.    Spoke w/ pt - offered 04/16 at 3 PM w/ Dr. Nani Skillern at [facility]. Pt accepted appt and was appreciative of call.

## 2017-06-06 NOTE — Progress Notes (Signed)
Reason for visit:   No chief complaint on file.    Demographics:               Patient Name: Pedro Shannon       Medical Record #: [id number]       DOB: 06-02-1950       Age: 67 year old       Sex: male    [address], [name], [place] [address]  [address]    HPI:    Pedro Shannon is a 67 year old male with no family hx of prostate cancer presenting for reassessment of lower urinary tract symptoms status post REZUM 07/18/2016, bladder diverticula (SP bladder diverticulectomy x2 from 04/10/2017 for benign disease), recurrent UTI, low risk prostate cancer on active surveillance (5% left lateral mid core involved with Gleason 3 + 3 prostate cancer on biopsy 10/28/15).    UROLOGIC HISTORY:  Prostate cancer:  OSH  4K Score 02/26/15:  24% - high risk    07/15/15 - 2.4  01/06/15 - 4.3  07/01/14 - 3.0  11/19/13 - 3.7  (% free 9%)  12/19/11 - 3.4  08/03/11 - 3.3  12/18/10 - 3.1  06/17/10 - 3.0    MRI 05/30/15 - PIRADS 4 lesion involving left base with possible extracapsular extension    Negative prostate biopsy 06/16/15     [place]:  MRI 08/25/2015 (50 cc prostate):  IMPRESSION:  1. Two PI-RADS 3 lesions, in the left prostatic transition zone mid-gland to   base, and in the right prostatic mid-gland, both possibly also involving the   peripheral zone.    2. No definite extra-prostatic extension.    3. Multiple incidental findings including bilateral renal hernias, bladder   diverticula, bladder wall thickening, and colonic diverticuli.    5% left lateral mid core involved with Gleason 3 + 3 prostate cancer on biopsy 10/28/15  He is on TRT by an outside provider.    LUTS:  Longstanding hx of LUTS with know bladder diverticula. Wanted to address outflow obstruction prior to proceeding with diverticulectomy.    Intitially had marked improvement to luts following Rezum 07/18/16.    Proceeded with diverticulectomy 04/10/17 which was uneventful other than difficulty with catheter placement despite  cystoscopic placement. Subsequently required an SP tube which was eventually removed.    Since diverticulectomy he has had worsening LUTS and recurrent UTIs.    I have offered him further evaluation but he is reluctant and wants nothing more at this time.    His Current LUTS include: frequency Q45 min, nocturia Q45 minutes, dysuria, no hematuria, yes urgency, yes incontinence, weak/spraying urinary stream, no post void sensation. SS 33/6    Review of Systems  Constitutional: Unusual fatigue  Eyes: Negative  ENT: Negative  Cardiac: Negative  Pulmonary: Negative  Gastrointestional: Negative  Musculoskeletal: Negative  Skin: Negative  Neurologic: Numbness  Psychiatric: Difficulty Sleeping  Endocrine: Negative  Blood Disease: Negative  Allergy: Negative      [facility] IPSS REVIEW FS 10/12/2016 08/30/2016 06/07/2016 11/10/2015   Incomplete Emptying _0 Frequency _1 Intermittencey _2 0   Urgency _3 Weak Stream _4 Straining _5 0   Nocturia _6 IPSS Total Score _7 Quality of Life 6 4 5  5           Lab Results   Component Value Date/Time    PSA 2.12 05/08/2016 01:15 PM    PSA 2.92 07/26/2015 03:04 PM        PSA 3.2 01/09/16.    FINAL PATHOLOGIC DIAGNOSIS 10/28/15 reviewed personally:  A: Prostate, right lateral apex, biopsy  -Benign prostatic tissue.  -See comment.  B: Prostate, right medial apex, biopsy       -Benign prostatic tissue.  C: Prostate, right lateral mid, biopsy       -Benign fibromuscular tissue.  D: Prostate, right medial mid, biopsy       -Benign prostatic tissue.       -See comment.  E: Prostate, right lateral base, biopsy       -Benign prostatic tissue.  F: Prostate, right medial base, biopsy       -Benign prostatic tissue.  G: Prostate, left lateral apex, biopsy       -Benign prostatic tissue.  H: Prostate, left medial apex, biopsy       -Benign prostatic tissue with mild acute inflammation.  I: Prostate, left lateral mid, biopsy  -Adenocarcinoma, Gleason score  3+3=6 ,involving <5% of the length of one  out of one core (Grade group 1; <1 mm tumor length).  -See comment.  J: Prostate, left medial mid, biopsy       -Benign prostatic tissue.  K: Prostate, left lateral base, biopsy       -Benign prostatic tissue.  L: Prostate, left medial base, biopsy       -Benign prostatic tissue.       -Seminal vesicle/ejaculatory duct tissue identified.  M: Prostate, target #1, biopsy       -Benign prostatic tissue.  N: Prostate, target #2, biopsy  -Prostatic tissue with a small focus of atypical glands, cannot exclude  prostatic adenocarcinoma.       -See comment.    MRI PELVIS WO/W CONTRAST 08/25/15 reviewed personally:  1. Two PI-RADS 3 lesions, in the left prostatic transition zone mid-gland to   base, and in the right prostatic mid-gland, both possibly also involving the   peripheral zone.    2. No definite extra-prostatic extension.    3. Multiple incidental findings including bilateral renal hernias, bladder   diverticula, bladder wall thickening, and colonic diverticuli.    Outside Records:  PSA  07/15/15 - 2.4  01/06/15 - 4.3  07/01/14 - 3.0  11/19/13 - 3.7  (% free 9%)  12/19/11 - 3.4  08/03/11 - 3.3  12/18/10 - 3.1  06/17/10 - 3.0    4K Score 02/26/15:    24% - high risk    Pathology 06/16/15:  A.  L Base  Atypical small acinar proliferation  B.  L Lateral base  Benign prostatic tissue  C.  L Mid  Atypical small acinar proliferation  D.  L Lateral Mid  Benign prostatic tissue  E.  L Apex  Benign prostatic tissue  F.  L Lateral Apex  Benign prostatic tissue  G.  R Base  Benign prostatic tissue  H.  R Lateral base  Benign prostatic tissue  I.  R Mid  Atypical small acinar proliferation  J.  R Lateral Mid  Benign prostatic tissue  K.  R Apex  Benign prostatic tissue  L.  R Lateral Apex  Benign prostatic tissue    MRI 05/30/15:  PIRADS 4 lesion involving left base with possible extracapsular extension     Review of  Systems    \    Wellbeing assessment:  Wellbeing screening reviewed.    Pt  indicated no needs.    Past Medical History:   Diagnosis Date    Prostate cancer (CMS-HCC)        Past Surgical History:   Procedure Laterality Date    NASAL SEPTOPLASTY      R inguinal hernia      SUPRAPUBIC TUBE PLACEMENT         Allergies   Allergen Reactions    Dutasteride Hives    Rogaine [Minoxidil] Hives       Current Outpatient Medications   Medication Sig Dispense Refill    acetaminophen (TYLENOL) 325 MG tablet TAKE TWO TABLETS BY MOUTH EVERY SIX HOURS AS NEEDED FOR MILD PAIN 30 each 0    docusate sodium (COLACE) 100 MG capsule TAKE ONE CAPSULE BY MOUTH TWICE DAILY 60 each 0    HYDROcodone-acetaminophen (NORCO) 5-325 MG tablet TAKE ONE-HALF TABLET BY MOUTH EVERY EIGHT HOURS AS NEEDED FOR SEVERE PAIN OR BREAKTHROUGH PAIN 8 each 0    phenazopyridine (PYRIDIUM) 200 MG tablet TAKE ONE TABLET BY MOUTH THREE TIMES DAILY AS NEEDED FOR DYSURIA 21 each 0     No current facility-administered medications for this visit.        Social History     Socioeconomic History    Marital status: Married     Spouse name: Not on file    Number of children: Not on file    Years of education: Not on file    Highest education level: Not on file   Occupational History    Occupation: Armed forces operational officer strain: Not on file    Food insecurity:     Worry: Not on file     Inability: Not on file    Transportation needs:     Medical: Not on file     Non-medical: Not on file   Tobacco Use    Smoking status: Never Smoker    Smokeless tobacco: Never Used   Substance and Sexual Activity    Alcohol use: No    Drug use: No    Sexual activity: Not on file   Lifestyle    Physical activity:     Days per week: Not on file     Minutes per session: Not on file    Stress: Not on file   Relationships    Social connections:     Talks on phone: Not on file     Gets together: Not on file     Attends religious service: Not on file     Active member of club or organization: Not on file     Attends meetings of  clubs or organizations: Not on file     Relationship status: Not on file    Intimate partner violence:     Fear of current or ex partner: Not on file     Emotionally abused: Not on file     Physically abused: Not on file     Forced sexual activity: Not on file   Other Topics Concern    Not on file   Social History Narrative    Not on file       Physical Exam:   There were no vitals filed for this visit.    GENERAL: The patient is alert, cooperative and in no acute distress.   HEENT: normalcephalic/atraumatic  NECK: midline trachea. No  jugular venous distention  PULM: no evidence of labored breathing  GI: soft, NT, ND, no massess.  No hepatosplenomegaly  BACK: No CVAT  EXTREMITES: Joints are normal without redness or swelling.   SKIN: There is no edema or cyanosis.   NEURO: no motor or sensory deficits.  Alert and Oriented x 3    Labs/Diagnostic X-rays:  Lab Results   Component Value Date    WBC 8.2 04/17/2017    RBC 3.96 (L) 04/17/2017    HGB 12.9 (L) 04/17/2017    HCT 38.8 (L) 04/17/2017    MCV 98.0 (H) 04/17/2017    MCHC 33.2 04/17/2017    RDW 14.0 04/17/2017    PLT 277 04/17/2017    MPV 9.8 04/17/2017    LYMPHS 11 04/17/2017    MONOS 10 04/17/2017    EOS 1 04/17/2017    BASOS 0 04/17/2017       Lab Results   Component Value Date    BUN 19 04/20/2017    CREAT 0.95 04/20/2017    CL 97 (L) 04/20/2017    NA 141 04/20/2017    K 3.4 (L) 04/20/2017    [place] 8.0 (L) 04/20/2017    TBILI 1.20 (H) 04/16/2017    ALB 3.8 04/16/2017    TP 7.3 04/16/2017    AST 21 04/16/2017    ALK 59 04/16/2017    BICARB 30 (H) 04/20/2017    ALT 21 04/16/2017    GLU 126 (H) 04/20/2017       Lab Results   Component Value Date    PSA 2.12 05/08/2016       No results found for: TESTOSTERONE    Lab Results   Component Value Date    COLORUA Yellow 05/17/2017    APPEARUA Hazy 05/17/2017    GLUCOSEUA Negative 05/17/2017    BILIUA Negative 05/17/2017    KETONEUA Negative 05/17/2017    SGUA 1.017 05/17/2017    BLOODUA Negative 05/17/2017    PHUA 6.0  05/17/2017    PROTEINUA 1+ (A) 05/17/2017    UROBILUA Negative 05/17/2017    NITRITEUA Negative 05/17/2017    LEUKESTUA 3+ (A) 05/17/2017    WBCUA >50 (A) 05/17/2017    RBCUA 0-2 05/17/2017         Assessment/Plan:   67 year old male multiple urologic issues.    Prostate cancer-we will need a serum testosterone and prostate specific antigen test on him.  - I did recommend a repeat biopsy however the patient is reluctant given his low PSAs.  - he is to follow-up with the clinic who is offering him his testosterone.  The should make sure that he is not ever supra therapeutic.    Lower urinary tract symptoms-it is unclear to me what is happening with his prostatic urethra and why it was so difficult to place a foley catheter at the time of diverticulectomy.  -I have offered him an in office cystoscopy now as well as referral to a urethral specialist but again, he would rather wait at this time.  - he is willing to proceed to cystoscopy in mid July which will be arranged    Recurrent urinary tract infections-it is unclear whether not this is recurrent versus persistent infection.  -he is to have a urine culture today and will undergo a prolonged course of Septra therapy.  -we will repeat the culture in 4-6 weeks.    In addition, he had an incidental finding of an iliac aneurysm and will be referred to vascular  surgery for follow-up.    Thank you for allowing me to participate in this patient's care.   Sincerely,   Patty Sermons Susette Seminara

## 2017-06-11 ENCOUNTER — Ambulatory Visit: Payer: Medicare Other | Attending: Urology | Admitting: Urology

## 2017-06-11 VITALS — BP 137/79 | HR 81 | Temp 98.3°F | Resp 15 | Ht 72.0 in | Wt 190.6 lb

## 2017-06-11 DIAGNOSIS — N401 Enlarged prostate with lower urinary tract symptoms: Secondary | ICD-10-CM | POA: Insufficient documentation

## 2017-06-11 DIAGNOSIS — I729 Aneurysm of unspecified site: Secondary | ICD-10-CM | POA: Insufficient documentation

## 2017-06-11 DIAGNOSIS — C61 Malignant neoplasm of prostate: Secondary | ICD-10-CM | POA: Insufficient documentation

## 2017-06-11 DIAGNOSIS — N400 Enlarged prostate without lower urinary tract symptoms: Principal | ICD-10-CM | POA: Insufficient documentation

## 2017-06-11 MED ORDER — SULFAMETHOXAZOLE-TRIMETHOPRIM 800-160 MG OR TABS
1.0000 | ORAL_TABLET | Freq: Two times a day (BID) | ORAL | 0 refills | Status: DC
Start: 2017-06-11 — End: 2019-12-28

## 2017-06-11 MED ORDER — TAMSULOSIN HCL 0.4 MG PO CAPS
0.40 mg | ORAL_CAPSULE | Freq: Every day | ORAL | Status: DC
Start: ? — End: 2019-12-28

## 2017-06-11 NOTE — Patient Instructions (Signed)
A Karim Kader, MD, phD, FRCSC  Professor of Surgery  Department of Urology    Nurse  Phone:  [phone]  Please note my phone number is for for non-urgent   clinical questions that can be answered in the next 24 hours.    For Prescription refills please call [phone].        Administrative Assistant  Jessica [phone]  Fax [phone]    To schedule a future appointment call [phone]            [facility] - Urology  Monday-Friday 8:00- 5:00p.m. (Closed Holidays and Weekends)    AFTER HOURS EMERGENCY NUMBER                  [phone]  Ask for the Urologist/Oncologist Physician On-Call.        Infusion Center [phone]    Call to schedule, cancel, or reschedule chemotherapy appointments.  Infusion Center hours are Monday - Friday 07:30 - 9:30 and   Holidays and Weekends are 8am-6pm    Radiation Oncology [phone]    Call to schedule, cancel, or reschedule radiation appointments    To Schedule MRI  [phone] or [phone]     Please contact the [facility] Scheduling line at   [phone] to schedule your US, CT Scans or studies.      Social Worker: Yuko Abbott, LCSW   [phone]     [facility]  [phone]

## 2017-06-12 ENCOUNTER — Telehealth (HOSPITAL_BASED_OUTPATIENT_CLINIC_OR_DEPARTMENT_OTHER): Payer: Self-pay | Admitting: Urology

## 2017-06-12 NOTE — Telephone Encounter (Signed)
Spoke to pt, he previously asked if he could trial finasteride, Dr. Nani Skillern said no b/c he is allergic to Dutasteride. Pt is aware and ok with this. Gave him number to schedule cysto. No further questions or concerns at this time.

## 2017-06-13 LAB — URINE CULTURE

## 2017-06-14 ENCOUNTER — Telehealth (HOSPITAL_BASED_OUTPATIENT_CLINIC_OR_DEPARTMENT_OTHER): Payer: Self-pay | Admitting: Urology

## 2017-06-14 DIAGNOSIS — C61 Malignant neoplasm of prostate: Principal | ICD-10-CM

## 2017-06-14 MED ORDER — CIPROFLOXACIN HCL 500 MG OR TABS
500.0000 mg | ORAL_TABLET | Freq: Two times a day (BID) | ORAL | 0 refills | Status: AC
Start: 2017-06-14 — End: 2017-06-28

## 2017-06-14 NOTE — Telephone Encounter (Signed)
Spoke to pt, he is going to try and take Cipro even though he has not felt good on it before. Pt had bad side effects on Bactrim yesterday and stopped it. Pt denies all UTI symptoms right now and feels better after stopping Bactrim. Nurse encouraged pt to drink lots of fluids and go to ED if he develops fever, chills, night sweats, flank pain, blood in urine. Pt says he is going to make an appt with Dr. Nani Skillern to discuss his previous notes from Feb b/c it reads pt has "iliac aneurysm" and a finding linked to "lung cancer". He wants to discuss these issues before going further with urology treatments. Nurse gave pt appt. Phone number.

## 2017-06-14 NOTE — Addendum Note (Signed)
Addended by: Bufford Spikes on: 06/14/2017 09:04 AM     Modules accepted: Orders

## 2017-06-14 NOTE — Telephone Encounter (Signed)
Left v/m returning his v/m about SE of Bactrim he is experiencing. Spoke to Dr. Nani Skillern and we will prescribe CIPRO for his positive Urine culture. Detailed v/m was left for pt.

## 2017-06-28 ENCOUNTER — Telehealth (HOSPITAL_BASED_OUTPATIENT_CLINIC_OR_DEPARTMENT_OTHER): Payer: Self-pay | Admitting: Urology

## 2017-06-28 DIAGNOSIS — N400 Enlarged prostate without lower urinary tract symptoms: Secondary | ICD-10-CM

## 2017-06-28 DIAGNOSIS — C61 Malignant neoplasm of prostate: Secondary | ICD-10-CM

## 2017-06-28 NOTE — Telephone Encounter (Signed)
Spoke to pt, he denies all urinary symptoms such as frequency, dysuria, hematuria, flank pain, fever, night sweats, cloudy urine. Pt has taken his last Cipro today. Nurse told pt since he is experiencing no symptoms at this point and his urine is clear, then we should hold off on giving a third week of medication. Pt is aware to call if he develops any symptoms. Pt was asked if he wanted the after hours line and refused he could wait until Monday if symptoms arise. Pt has requested a "full blood work up" including urine culture and blood cultures. Nurse told pt this is not protocol but will ask Dr. Nani Skillern. Pt knows Dr. Nani Skillern is out of town at a conference and will get a call back from the nurse on Monday with orders/plan per Dr. Nani Skillern. Pt has no further questions or concerns at this time.

## 2017-06-28 NOTE — Telephone Encounter (Signed)
Pt is requesting to extend ciprofloxacin (CIPRO) 500 MG tablet (Order# [id number])     Another week. Per pt hi urine has only returned to normal color as of yesterday. No more foul odor, but will like to run medication 1 more week. Please advise pt thank you.     Please advise pt  [id number] [place], [place] [facility] [id number]

## 2017-07-01 NOTE — Telephone Encounter (Signed)
Spoke to pt, he is aware Dr. Nani Skillern has agreed to order CBC, CMP and Gilbert per pt request to make sure infection is gone. No further concerns or questions from pt at this time.

## 2017-07-02 ENCOUNTER — Telehealth (HOSPITAL_BASED_OUTPATIENT_CLINIC_OR_DEPARTMENT_OTHER): Payer: Self-pay

## 2017-07-02 NOTE — Telephone Encounter (Signed)
Patient no longer has suprapubic cath in place - does not need follow up with VIR.

## 2017-07-04 ENCOUNTER — Encounter (HOSPITAL_BASED_OUTPATIENT_CLINIC_OR_DEPARTMENT_OTHER): Payer: Self-pay | Admitting: Urology

## 2017-07-04 DIAGNOSIS — N3000 Acute cystitis without hematuria: Secondary | ICD-10-CM

## 2017-07-11 ENCOUNTER — Other Ambulatory Visit: Payer: Medicare Other | Attending: Urology

## 2017-07-11 DIAGNOSIS — N3000 Acute cystitis without hematuria: Secondary | ICD-10-CM | POA: Insufficient documentation

## 2017-07-11 DIAGNOSIS — N400 Enlarged prostate without lower urinary tract symptoms: Secondary | ICD-10-CM | POA: Insufficient documentation

## 2017-07-11 LAB — BASIC METABOLIC PANEL, BLOOD
Anion Gap: 9 mmol/L (ref 7–15)
BUN: 14 mg/dL (ref 8–23)
Bicarbonate: 29 mmol/L (ref 22–29)
Calcium: 9.8 mg/dL (ref 8.5–10.6)
Chloride: 105 mmol/L (ref 98–107)
Creatinine: 0.91 mg/dL (ref 0.67–1.17)
GFR: >60 mL/min
Glucose: 87 mg/dL (ref 70–99)
Potassium: 4.6 mmol/L (ref 3.5–5.1)
Sodium: 143 mmol/L (ref 136–145)

## 2017-07-11 LAB — CBC WITH DIFF, BLOOD
ANC-Automated: 4 10*3/uL (ref 1.6–7.0)
Abs Basophils: 0.1 10*3/uL (ref <0.1)
Abs Eosinophils: 0.2 10*3/uL (ref 0.1–0.5)
Abs Lymphs: 2.6 10*3/uL (ref 0.8–3.1)
Abs Monos: 0.6 10*3/uL (ref 0.2–0.8)
Basophils: 1 %
Eosinophils: 3 %
Hct: 51.8 % — ABNORMAL HIGH (ref 40.0–50.0)
Hgb: 16.7 gm/dL (ref 13.7–17.5)
Lymphocytes: 35 %
MCH: 31 pg (ref 26.0–32.0)
MCHC: 32.2 g/dL (ref 32.0–36.0)
MCV: 96.1 um3 — ABNORMAL HIGH (ref 79.0–95.0)
MPV: 10.5 fL (ref 9.4–12.4)
Monocytes: 8 %
Plt Count: 221 10*3/uL (ref 140–370)
RBC: 5.39 10*6/uL (ref 4.60–6.10)
RDW: 13.2 % (ref 12.0–14.0)
Segs: 53 %
WBC: 7.6 10*3/uL (ref 4.0–10.0)

## 2017-07-13 LAB — URINE CULTURE

## 2017-07-15 ENCOUNTER — Ambulatory Visit (HOSPITAL_BASED_OUTPATIENT_CLINIC_OR_DEPARTMENT_OTHER): Admit: 2017-07-15 | Payer: Self-pay | Admitting: Diagnostic Radiology

## 2017-07-15 ENCOUNTER — Encounter (HOSPITAL_BASED_OUTPATIENT_CLINIC_OR_DEPARTMENT_OTHER): Payer: Self-pay

## 2017-07-15 SURGERY — IR TUBE CHECK
Anesthesia: Local

## 2017-08-15 ENCOUNTER — Other Ambulatory Visit: Payer: Medicare Other | Attending: Urology

## 2017-08-15 DIAGNOSIS — C61 Malignant neoplasm of prostate: Principal | ICD-10-CM | POA: Insufficient documentation

## 2017-08-15 LAB — PSA ULTRASENSITIVE: PSA: 1.72 ng/mL (ref 0.00–3.99)

## 2017-08-16 ENCOUNTER — Encounter (HOSPITAL_BASED_OUTPATIENT_CLINIC_OR_DEPARTMENT_OTHER): Payer: Self-pay | Admitting: Urology

## 2017-08-16 LAB — TESTOSTERONE (MALE), BLOOD: Testosterone (Male): 12.11 ng/mL — ABNORMAL HIGH (ref 2.80–8.00)

## 2017-08-20 ENCOUNTER — Telehealth (HOSPITAL_COMMUNITY): Payer: Self-pay

## 2017-08-20 NOTE — Telephone Encounter (Signed)
Called pt to schd for their Vascular Surg Consult. I was able to reach pt who says he is sorry he knows he had asked for a call back this week but he is again too busy to schd asked that I reach out to him in a week or 2. I will do so. If pt calls back they are ok to schd, thank you.  2nd ATTEMPT    (prev notes)  Called and spoke to patient ,who would like a call back in a week.  Triage complete, any MD  Assisting Angelica  1st ATTEMPT    (NP Notes ok to be seen - has CT scan. iliac artery aneurysm)

## 2017-09-03 NOTE — Telephone Encounter (Signed)
Following up on prev notes below. I tried to call pt again, I was able to reach him & get him schd for 8/14 @ 2pm w/Dr. Rosezetta Schlatter per his request.  3rd ATTEMPT    (prev notes)  Called pt to schd for their Vascular Surg Consult. I was able to reach pt who says he is sorry he knows he had asked for a call back this week but he is again too busy to schd asked that I reach out to him in a week or 2. I will do so. If pt calls back they are ok to schd, thank you.  2nd ATTEMPT    (prev notes)  Called and spoke to patient ,who would like a call back in a week.  Triage complete, any MD  Assisting Angelica  1st ATTEMPT    (NP Notes ok to be seen - has CT scan. iliac artery aneurysm)

## 2017-09-24 ENCOUNTER — Ambulatory Visit (HOSPITAL_BASED_OUTPATIENT_CLINIC_OR_DEPARTMENT_OTHER): Payer: Medicare Other | Admitting: Urology

## 2017-10-07 ENCOUNTER — Ambulatory Visit (HOSPITAL_COMMUNITY): Payer: Medicare Other | Admitting: Vascular Surgery

## 2017-10-09 ENCOUNTER — Ambulatory Visit (HOSPITAL_COMMUNITY): Payer: Medicare Other | Admitting: Surgery

## 2017-10-21 ENCOUNTER — Ambulatory Visit: Payer: Medicare Other | Attending: Urology | Admitting: Vascular Surgery

## 2017-10-21 ENCOUNTER — Encounter (HOSPITAL_COMMUNITY): Payer: Self-pay | Admitting: Vascular Surgery

## 2017-10-21 VITALS — BP 134/90 | HR 74 | Temp 97.8°F | Resp 18 | Ht 72.0 in | Wt 192.8 lb

## 2017-10-21 DIAGNOSIS — I723 Aneurysm of iliac artery: Secondary | ICD-10-CM | POA: Insufficient documentation

## 2017-10-21 DIAGNOSIS — I729 Aneurysm of unspecified site: Secondary | ICD-10-CM | POA: Insufficient documentation

## 2017-10-21 NOTE — Patient Instructions (Signed)
Please call after CT scan is completed and we can go over results with you.

## 2017-10-21 NOTE — Progress Notes (Signed)
Date: October 21, 2017   Patient Name: Pedro Shannon   Medical Record #: [id number]   DOB: 12-13-1950  Age: 67 year old  Sex: male    Referring MD:  Cira Servant  Department of Urology [address]  [place], [place] [address]    Reason for Visit  Chief Complaint   Patient presents with   . New Patient        History of Present Illness:     Pedro Shannon is a 67 year old male who is here for New Patient      He reports incedntal finding on CT scan for bladder issue.  Denies claudication pain, does have some numbness and ?periphearal neuropathy in right foot.         Past Medical History  Past Medical History:   Diagnosis Date   . Prostate cancer (CMS-HCC)        Past Surgical History  Past Surgical History:   Procedure Laterality Date   . NASAL SEPTOPLASTY     . R inguinal hernia     . SUPRAPUBIC TUBE PLACEMENT         Allergies  Allergies   Allergen Reactions   . Dutasteride Hives   . Rogaine [Minoxidil] Hives     Does patient have allergy to contrast or iodine: no    Medications  Current Outpatient Medications   Medication Sig Dispense Refill   . acetaminophen (TYLENOL) 325 MG tablet TAKE TWO TABLETS BY MOUTH EVERY SIX HOURS AS NEEDED FOR MILD PAIN 30 each 0   . docusate sodium (COLACE) 100 MG capsule TAKE ONE CAPSULE BY MOUTH TWICE DAILY 60 each 0   . sulfamethoxazole-trimethoprim (BACTRIM DS, SEPTRA DS) 800-160 MG tablet Take 1 tablet by mouth 2 times daily. 28 tablet 0   . tamsulosin (FLOMAX) 0.4 MG capsule Take 0.4 mg by mouth daily.       No current facility-administered medications for this visit.        Social History  Social History     Socioeconomic History   . Marital status: Married     Spouse name: Not on file   . Number of children: Not on file   . Years of education: Not on file   . Highest education level: Not on file   Occupational History   . Occupation: Chief Executive Officer   Tobacco Use   . Smoking status: Never Smoker   . Smokeless tobacco: Never Used   Substance and  Sexual Activity   . Alcohol use: No   . Drug use: No   . Sexual activity: Not on file   Social Activities of Daily Living Present   . Not on file   Social History Narrative   . Not on file         Family History  Family History   Problem Relation Name Age of Onset   . Lung Cancer Mother     . Melanoma Cancer Mother     . Other Father          esophageal cancer       Review of Systems    Pertinent items are noted in HPI. denies chest pain, Abdominal pain, SOB. Able to walk without claudication.    Physical Exam  Patient is a 67 year old male who appeared alert, cooperative, no distress  BP 134/90 (BP Location: Left arm, BP Patient Position: Sitting, BP cuff size: Regular)   Pulse 74  Temp 97.8 F (36.6 C) (Oral)   Resp 18   Ht 6' (1.829 m)   Wt 87.5 kg (192 lb 12.8 oz)   SpO2 98%   BMI 26.15 kg/m   Body mass index is 26.15 kg/m.  General Appearance: healthy, alert, no distress, pleasant affect, cooperative.  Heart:  rrr.  Lungs: non-labored on room air.  Abdomen: Abdomen soft, non-tender. No palpable masses  Extremities:  no cyanosis, clubbing, or edema.  Vascular:   Left  right   Popliteal   2+ 2+  DP   2+  2+  PT  2+ 2+    Medical Decision Making  Test Results:    VASCULATURE: Right common iliac aneurysm measuring 21 mm in diameter. Mild atherosclerotic calcifications.    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 Can call with results. If stable will repeat in additional 6 months and if continues to be stable then yearly surveillance.       Patient seen and evaluated with Dr. Belva Bertin, NP  Vascular surgery

## 2017-11-05 NOTE — Progress Notes (Signed)
I have personally seen Pedro Shannon with Pedro Shannon.  I agree with the note written by the nurse practioner/resident/medical student.  The patient presents with a history of incidental 2 cm CIA found on bladder CT 2/19.  Pertinent physical exam findings are pulses palp, abdomen soft.  Radiologic exams show 2cm R CIA.  My assessment is CIA ectasia.  My plan for care is repeat CTA.    Return to clinic in  1 year if no change.    Betsey Amen MD, FACS  Professor of Surgery  [facility]

## 2017-11-06 NOTE — Progress Notes (Signed)
Reason for visit:   No chief complaint on file.    Demographics:               Patient Name: Pedro Shannon       Medical Record #: [id number]       DOB: April 14, 1950       Age: 67 year old       Sex: male    [name] [place], [place] [id number]  [place]    HPI:    Pedro Shannon is a 67 year old male with no family hx of prostate cancer presenting for reassessment of lower urinary tract symptoms status post REZUM 07/18/2016, bladder diverticula (SP bladder diverticulectomy x2 from 04/10/2017 for benign disease), recurrent UTI, low risk prostate cancer on active surveillance (5% left lateral mid core involved with Gleason 3 + 3 prostate cancer on biopsy 10/28/15).    UROLOGIC HISTORY:  Prostate cancer:  OSH  4K Score 02/26/15:  24% - high risk    07/15/15 - 2.4  01/06/15 - 4.3  07/01/14 - 3.0  11/19/13 - 3.7  (% free 9%)  12/19/11 - 3.4  08/03/11 - 3.3  12/18/10 - 3.1  06/17/10 - 3.0    MRI 05/30/15 - PIRADS 4 lesion involving left base with possible extracapsular extension    Negative prostate biopsy 06/16/15     [place]:  MRI 08/25/2015 (50 cc prostate):  IMPRESSION:  1. Two PI-RADS 3 lesions, in the left prostatic transition zone mid-gland to   base, and in the right prostatic mid-gland, both possibly also involving the   peripheral zone.    2. No definite extra-prostatic extension.    3. Multiple incidental findings including bilateral renal hernias, bladder   diverticula, bladder wall thickening, and colonic diverticuli.    5% left lateral mid core involved with Gleason 3 + 3 prostate cancer on biopsy 10/28/15  He is on TRT by an outside provider.    LUTS:  Longstanding hx of LUTS with know bladder diverticula. Wanted to address outflow obstruction prior to proceeding with diverticulectomy.    Intitially had marked improvement to luts following Rezum 07/18/16.    Proceeded with diverticulectomy 04/10/17 which was uneventful other than difficulty with catheter placement despite  cystoscopic placement. Subsequently required an SP tube which was eventually removed.    Since diverticulectomy he has had worsening LUTS and recurrent UTIs.    I have offered him further evaluation but he is reluctant and wants nothing more at this time.    His Current LUTS include: frequency Q45 min, nocturia Q45 minutes, dysuria, no hematuria, yes urgency, yes incontinence, weak/spraying urinary stream, no post void sensation. SS 33/6           [facility] IPSS REVIEW FS 10/12/2016 08/30/2016 06/07/2016 11/10/2015   Incomplete Emptying 4 3 4 3    Frequency 5 2 4 3    Intermittencey 2 2 4  0   Urgency 4 3 4 2    Weak Stream 4 1 4 1    Straining 2 1 3  0   Nocturia 4 3 3 3    IPSS Total Score 25 15 26 12    Quality of Life 6 4 5 5            Lab Results   Component Value Date/Time    PSA 1.72 08/15/2017 11:21 AM    PSA 2.12 05/08/2016 01:15 PM    PSA 2.92 07/26/2015 03:04 PM  PSA 3.2 01/09/16.    FINAL PATHOLOGIC DIAGNOSIS 10/28/15 reviewed personally:  A: Prostate, right lateral apex, biopsy  -Benign prostatic tissue.  -See comment.  B: Prostate, right medial apex, biopsy       -Benign prostatic tissue.  C: Prostate, right lateral mid, biopsy       -Benign fibromuscular tissue.  D: Prostate, right medial mid, biopsy       -Benign prostatic tissue.       -See comment.  E: Prostate, right lateral base, biopsy       -Benign prostatic tissue.  F: Prostate, right medial base, biopsy       -Benign prostatic tissue.  G: Prostate, left lateral apex, biopsy       -Benign prostatic tissue.  H: Prostate, left medial apex, biopsy       -Benign prostatic tissue with mild acute inflammation.  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  -See comment.  J: Prostate, left medial mid, biopsy       -Benign prostatic tissue.  K: Prostate, left lateral base, biopsy       -Benign prostatic tissue.  L: Prostate, left medial base,  biopsy       -Benign prostatic tissue.       -Seminal vesicle/ejaculatory duct tissue identified.  M: Prostate, target #1, biopsy       -Benign prostatic tissue.  N: Prostate, target #2, biopsy  -Prostatic tissue with a small focus of atypical glands, cannot exclude  prostatic adenocarcinoma.       -See comment.    MRI PELVIS WO/W CONTRAST 08/25/15 reviewed personally:  1. Two PI-RADS 3 lesions, in the left prostatic transition zone mid-gland to   base, and in the right prostatic mid-gland, both possibly also involving the   peripheral zone.    2. No definite extra-prostatic extension.    3. Multiple incidental findings including bilateral renal hernias, bladder   diverticula, bladder wall thickening, and colonic diverticuli.    Outside Records:  PSA  07/15/15 - 2.4  01/06/15 - 4.3  07/01/14 - 3.0  11/19/13 - 3.7  (% free 9%)  12/19/11 - 3.4  08/03/11 - 3.3  12/18/10 - 3.1  06/17/10 - 3.0    4K Score 02/26/15:    24% - high risk    Pathology 06/16/15:  A.  L Base  Atypical small acinar proliferation  B.  L Lateral base  Benign prostatic tissue  C.  L Mid  Atypical small acinar proliferation  D.  L Lateral Mid  Benign prostatic tissue  E.  L Apex  Benign prostatic tissue  F.  L Lateral Apex  Benign prostatic tissue  G.  R Base  Benign prostatic tissue  H.  R Lateral base  Benign prostatic tissue  I.  R Mid  Atypical small acinar proliferation  J.  R Lateral Mid  Benign prostatic tissue  K.  R Apex  Benign prostatic tissue  L.  R Lateral Apex  Benign prostatic tissue    MRI 05/30/15:  PIRADS 4 lesion involving left base with possible extracapsular extension     Review of Systems    \    Wellbeing assessment:  Wellbeing screening reviewed.    Pt indicated no needs.    Past Medical History:   Diagnosis Date   . Prostate cancer (CMS-HCC)  Past Surgical History:   Procedure Laterality Date   . NASAL SEPTOPLASTY     . R inguinal hernia     . SUPRAPUBIC TUBE PLACEMENT         Allergies   Allergen Reactions   . Dutasteride Hives     . Rogaine [Minoxidil] Hives       Current Outpatient Medications   Medication Sig Dispense Refill   . acetaminophen (TYLENOL) 325 MG tablet TAKE TWO TABLETS BY MOUTH EVERY SIX HOURS AS NEEDED FOR MILD PAIN 30 each 0   . docusate sodium (COLACE) 100 MG capsule TAKE ONE CAPSULE BY MOUTH TWICE DAILY 60 each 0   . sulfamethoxazole-trimethoprim (BACTRIM DS, SEPTRA DS) 800-160 MG tablet Take 1 tablet by mouth 2 times daily. 28 tablet 0   . tamsulosin (FLOMAX) 0.4 MG capsule Take 0.4 mg by mouth daily.       No current facility-administered medications for this visit.        Social History     Socioeconomic History   . Marital status: Married     Spouse name: Not on file   . Number of children: Not on file   . Years of education: Not on file   . Highest education level: Not on file   Occupational History   . Occupation: Market researcher   . Financial resource strain: Not on file   . Food insecurity:     Worry: Not on file     Inability: Not on file   . Transportation needs:     Medical: Not on file     Non-medical: Not on file   Tobacco Use   . Smoking status: Never Smoker   . Smokeless tobacco: Never Used   Substance and Sexual Activity   . Alcohol use: No   . Drug use: No   . Sexual activity: Not on file   Lifestyle   . Physical activity:     Days per week: Not on file     Minutes per session: Not on file   . Stress: Not on file   Relationships   . Social connections:     Talks on phone: Not on file     Gets together: Not on file     Attends religious service: Not on file     Active member of club or organization: Not on file     Attends meetings of clubs or organizations: Not on file     Relationship status: Not on file   . Intimate partner violence:     Fear of current or ex partner: Not on file     Emotionally abused: Not on file     Physically abused: Not on file     Forced sexual activity: Not on file   Other Topics Concern   . Not on file   Social History Narrative   . Not on file       Physical Exam:    There were no vitals filed for this visit.    GENERAL: The patient is alert, cooperative and in no acute distress.   HEENT: normalcephalic/atraumatic  NECK: midline trachea. No jugular venous distention  PULM: no evidence of labored breathing  GI: soft, NT, ND, no massess.  No hepatosplenomegaly  BACK: No CVAT  EXTREMITES: Joints are normal without redness or swelling.   SKIN: There is no edema or cyanosis.   NEURO: no motor or sensory deficits.  Alert and Oriented x  3    Labs/Diagnostic X-rays:  Lab Results   Component Value Date    WBC 7.6 07/11/2017    RBC 5.39 07/11/2017    HGB 16.7 07/11/2017    HCT 51.8 (H) 07/11/2017    MCV 96.1 (H) 07/11/2017    MCHC 32.2 07/11/2017    RDW 13.2 07/11/2017    PLT 221 07/11/2017    MPV 10.5 07/11/2017    LYMPHS 35 07/11/2017    MONOS 8 07/11/2017    EOS 3 07/11/2017    BASOS 1 07/11/2017       Lab Results   Component Value Date    BUN 14 07/11/2017    CREAT 0.91 07/11/2017    CL 105 07/11/2017    NA 143 07/11/2017    K 4.6 07/11/2017    [place] 9.8 07/11/2017    TBILI 1.20 (H) 04/16/2017    ALB 3.8 04/16/2017    TP 7.3 04/16/2017    AST 21 04/16/2017    ALK 59 04/16/2017    BICARB 29 07/11/2017    ALT 21 04/16/2017    GLU 87 07/11/2017       Lab Results   Component Value Date    PSA 1.72 08/15/2017       No results found for: TESTOSTERONE    Lab Results   Component Value Date    COLORUA Yellow 05/17/2017    APPEARUA Hazy 05/17/2017    GLUCOSEUA Negative 05/17/2017    BILIUA Negative 05/17/2017    KETONEUA Negative 05/17/2017    SGUA 1.017 05/17/2017    BLOODUA Negative 05/17/2017    PHUA 6.0 05/17/2017    PROTEINUA 1+ (A) 05/17/2017    UROBILUA Negative 05/17/2017    NITRITEUA Negative 05/17/2017    LEUKESTUA 3+ (A) 05/17/2017    WBCUA >50 (A) 05/17/2017    RBCUA 0-2 05/17/2017         Assessment/Plan:   67 year old male multiple urologic issues.    Prostate cancer-we will need a serum testosterone and prostate specific antigen test on him.  - I did recommend a repeat biopsy  however the patient is reluctant given his low PSAs.  - he is to follow-up with the clinic who is offering him his testosterone.  The should make sure that he is not ever supra therapeutic.    Lower urinary tract symptoms-it is unclear to me what is happening with his prostatic urethra and why it was so difficult to place a foley catheter at the time of diverticulectomy.  -I have offered him an in office cystoscopy now as well as referral to a urethral specialist but again, he would rather wait at this time.  - he is willing to proceed to cystoscopy in mid July which will be arranged    Recurrent urinary tract infections-it is unclear whether not this is recurrent versus persistent infection.  -he is to have a urine culture today and will undergo a prolonged course of Septra therapy.  -we will repeat the culture in 4-6 weeks.    In addition, he had an incidental finding of an iliac aneurysm and will be referred to vascular surgery for follow-up.    Thank you for allowing me to participate in this patient's care.   Sincerely,   Patty Sermons Kader

## 2017-11-07 ENCOUNTER — Ambulatory Visit: Payer: Medicare Other | Attending: Urology | Admitting: Urology

## 2017-11-07 VITALS — BP 129/69 | HR 76 | Temp 98.5°F | Resp 12 | Ht 72.0 in | Wt 191.0 lb

## 2017-11-07 DIAGNOSIS — N401 Enlarged prostate with lower urinary tract symptoms: Secondary | ICD-10-CM | POA: Insufficient documentation

## 2017-11-07 MED ORDER — LIDOCAINE HCL 2% EX GEL (UROJET)
10.0000 mL | Freq: Once | Status: AC
Start: 2017-11-07 — End: 2017-11-07
  Administered 2017-11-07: 10 mL via URETHRAL

## 2017-11-07 NOTE — Patient Instructions (Signed)
Patient Instructions - Cystoscopy After Care     Post-Procedure Information: For approximately 8-12 hours following the cystoscopy, you might have to urinate more frequently than usual and you may experience burning during urination. These minor discomforts should go away within 12-24 hours. If they continue, contact your doctor.     Your urine may be slightly red following your cystoscopy, this is normal. However, if your urine is bright red or if you are passing clots, call the Urology Clinic. If you are unable to urinate, try sitting in a tub of warm water to help you relax. Urinate into the bath water.      Call the Urology office at # [phone] and ask to speak to the Urology Nurse, if you experience any of the following:   bright red bleeding or blood clots in your urine   burning or frequent urination after 24 hours    inability to urinate   fever and/or chills   severe pain in lower abdomen or low back   you break out in a rash     If you have any questions and it is after business hours of 8am to 4:30pm, please call the page operator at # [phone] and ask to speak to the Urologist on-call.

## 2017-11-07 NOTE — Interdisciplinary (Signed)
1530:Placed @ Tx#2, prep for cystoscopy , 2% Lidocaine UROJET applied  1610:Md arrived, Time out called  1611:START  1620:END

## 2017-11-20 NOTE — Procedures (Signed)
Pedro Shannon presents today for cystourethroscopy.     Indications:  67 year old male presents for evaluation of LUTS.    Procedure: After risks, benefits, alternatives, and indictaions were explained and consent was obtained, the patient was brought into the procedure room. Patient was then placed in the supine position, prepped with betadine and sterilely draped. Timeout performed confirming patient name, MRN, and procedure. Local anesthetic was administered per urethra with 2% lidocaine gel. The urethra was cannulated with the flexible cystoscope. A false passage was noted with the true lumen at the 2 O'Clock position (Dr. Neita Goodnight was called into the room), the bladder was meticulously surveyed; no tumors, stones, diverticulum, or lesions were identified.  The bilateral ureteral orifices were normal.  Upon retroflexion of the cystoscope, the bladder neck was normal.  The urethra appeared normal, no lesions noted.  A bladder wash cytology was sent.  The patient tolerated the procedure well, blood loss was minimal.  I, the attending, was present and scrubbed for the entire procedure.    Findings:  Urethral false passsage    Patient tolerated the procedure and there were no complications.    Assessment/Plan:    He is to f/u in the office in 1 month for assessment of LUTS and a decision regarding management of urethra.

## 2018-09-18 ENCOUNTER — Telehealth (HOSPITAL_BASED_OUTPATIENT_CLINIC_OR_DEPARTMENT_OTHER): Payer: Self-pay | Admitting: Urology

## 2018-09-18 DIAGNOSIS — R399 Unspecified symptoms and signs involving the genitourinary system: Secondary | ICD-10-CM

## 2018-09-18 NOTE — Telephone Encounter (Signed)
Recvd vm from patient asking to coordinate appt with Dr Nani Skillern.     I have called pt back and coordinated his clinica cysto with Dr Nani Skillern for September 2020.     Will send msg to MD team to place new order as current order has expired. Once new order is entered pls let AA know so that AA can attache order to appt.     Pt also coordinated appt for a ret appt as pt is have symptome's of urinary issues. Pt scheduled for 10/14/2018 and is fine with this date and time and is aware to call back if any issues arise. Pt was given direct number of call center of [phone].

## 2018-09-19 NOTE — Telephone Encounter (Signed)
New Cysto order attached to appt on 09/08 with Dr. Nani Skillern

## 2018-10-08 NOTE — Progress Notes (Signed)
Reason for visit:   Chief Complaint   Patient presents with   . Urinary Frequency     Demographics:               Patient Name: Pedro Shannon       Medical Record #: [id number]       DOB: [birth year]       Age: 68 year old       Sex: male    [place], [name], [address], [place] [id number]  [address]    HPI:    Pedro Shannon is a 68 year old male with no family hx of prostate cancer presenting for reassessment of lower urinary tract symptoms status post REZUM 07/18/2016, bladder diverticula (SP bladder diverticulectomy x2 from 04/10/2017 for benign disease), recurrent UTI, low risk prostate cancer on active surveillance (5% left lateral mid core involved with Gleason 3 + 3 prostate cancer on biopsy 10/28/15).    UROLOGIC HISTORY:  Prostate cancer:  OSH  4K Score 02/26/15:  24% - high risk    07/15/15 - 2.4  01/06/15 - 4.3  07/01/14 - 3.0  11/19/13 - 3.7  (% free 9%)  12/19/11 - 3.4  08/03/11 - 3.3  12/18/10 - 3.1  06/17/10 - 3.0    MRI 05/30/15 - PIRADS 4 lesion involving left base with possible extracapsular extension    Negative prostate biopsy 06/16/15     [place]:  MRI 08/25/2015 (50 cc prostate):  IMPRESSION:  1. Two PI-RADS 3 lesions, in the left prostatic transition zone mid-gland to   base, and in the right prostatic mid-gland, both possibly also involving the   peripheral zone.    2. No definite extra-prostatic extension.    3. Multiple incidental findings including bilateral renal hernias, bladder   diverticula, bladder wall thickening, and colonic diverticuli.    5% left lateral mid core involved with Gleason 3 + 3 prostate cancer on biopsy 10/28/15  He is on TRT by an outside provider.    LUTS:  Longstanding hx of LUTS with know bladder diverticula. Wanted to address outflow obstruction prior to proceeding with diverticulectomy.    Intitially had marked improvement to luts following Rezum 07/18/16.    Proceeded with diverticulectomy 04/10/17 which was uneventful other than difficulty with  catheter placement despite cystoscopic placement. Subsequently required an SP tube which was eventually removed.    Since diverticulectomy he has had worsening LUTS and recurrent UTIs.    I have offered him further evaluation but he is reluctant and wants nothing more at this time.    His current LUTS include: frequency 10 times per day, nocturia of 2 times, no dysuria, no hematuria, occasional urgency, yes incontinence, weak/spraying urinary stream, post void sensation.           [facility] IPSS REVIEW FS 10/12/2016 08/30/2016 06/07/2016 11/10/2015   Incomplete Emptying _0 Frequency _1 Intermittencey _2 0   Urgency _3 Weak Stream _4 Straining _5 0   Nocturia _6 IPSS Total Score _7 Quality of Life _8 Lab Results   Component Value Date/Time    PSA 1.72 08/15/2017 11:21 AM    PSA 2.12 05/08/2016 01:15 PM  PSA 2.92 07/26/2015 03:04 PM        PSA 3.2 01/09/16.    FINAL PATHOLOGIC DIAGNOSIS 10/28/15 reviewed personally:  A: Prostate, right lateral apex, biopsy  -Benign prostatic tissue.  -See comment.  B: Prostate, right medial apex, biopsy       -Benign prostatic tissue.  C: Prostate, right lateral mid, biopsy       -Benign fibromuscular tissue.  D: Prostate, right medial mid, biopsy       -Benign prostatic tissue.       -See comment.  E: Prostate, right lateral base, biopsy       -Benign prostatic tissue.  F: Prostate, right medial base, biopsy       -Benign prostatic tissue.  G: Prostate, left lateral apex, biopsy       -Benign prostatic tissue.  H: Prostate, left medial apex, biopsy       -Benign prostatic tissue with mild acute inflammation.  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  -See comment.  J: Prostate, left medial mid, biopsy       -Benign prostatic tissue.  K: Prostate, left lateral base, biopsy       -Benign prostatic  tissue.  L: Prostate, left medial base, biopsy       -Benign prostatic tissue.       -Seminal vesicle/ejaculatory duct tissue identified.  M: Prostate, target #1, biopsy       -Benign prostatic tissue.  N: Prostate, target #2, biopsy  -Prostatic tissue with a small focus of atypical glands, cannot exclude  prostatic adenocarcinoma.       -See comment.    MRI PELVIS WO/W CONTRAST 08/25/15 reviewed personally:  1. Two PI-RADS 3 lesions, in the left prostatic transition zone mid-gland to   base, and in the right prostatic mid-gland, both possibly also involving the   peripheral zone.    2. No definite extra-prostatic extension.    3. Multiple incidental findings including bilateral renal hernias, bladder   diverticula, bladder wall thickening, and colonic diverticuli.    Outside Records:  PSA  07/15/15 - 2.4  01/06/15 - 4.3  07/01/14 - 3.0  11/19/13 - 3.7  (% free 9%)  12/19/11 - 3.4  08/03/11 - 3.3  12/18/10 - 3.1  06/17/10 - 3.0    4K Score 02/26/15:    24% - high risk    Pathology 06/16/15:  A.  L Base  Atypical small acinar proliferation  B.  L Lateral base  Benign prostatic tissue  C.  L Mid  Atypical small acinar proliferation  D.  L Lateral Mid  Benign prostatic tissue  E.  L Apex  Benign prostatic tissue  F.  L Lateral Apex  Benign prostatic tissue  G.  R Base  Benign prostatic tissue  H.  R Lateral base  Benign prostatic tissue  I.  R Mid  Atypical small acinar proliferation  J.  R Lateral Mid  Benign prostatic tissue  K.  R Apex  Benign prostatic tissue  L.  R Lateral Apex  Benign prostatic tissue    MRI 05/30/15:  PIRADS 4 lesion involving left base with possible extracapsular extension            Wellbeing assessment:  Wellbeing screening reviewed.    Pt indicated no needs.    Past Medical History:   Diagnosis Date   .  Prostate cancer (CMS-HCC)        Past Surgical History:   Procedure Laterality Date   . NASAL SEPTOPLASTY     . R inguinal hernia     . SUPRAPUBIC TUBE PLACEMENT         Allergies   Allergen Reactions    . Dutasteride Hives   . Rogaine [Minoxidil] Hives       Current Outpatient Medications   Medication Sig Dispense Refill   . docusate sodium (COLACE) 100 MG capsule TAKE ONE CAPSULE BY MOUTH TWICE DAILY 60 each 0   . sulfamethoxazole-trimethoprim (BACTRIM DS, SEPTRA DS) 800-160 MG tablet Take 1 tablet by mouth 2 times daily. 28 tablet 0   . tamsulosin (FLOMAX) 0.4 MG capsule Take 0.4 mg by mouth daily.       No current facility-administered medications for this visit.        Social History     Socioeconomic History   . Marital status: Married     Spouse name: Not on file   . Number of children: Not on file   . Years of education: Not on file   . Highest education level: Not on file   Occupational History   . Occupation: Lawyer   Social Needs   . Financial resource strain: Not on file   . Food insecurity     Worry: Not on file     Inability: Not on file   . Transportation needs     Medical: Not on file     Non-medical: Not on file   Tobacco Use   . Smoking status: Never Smoker   . Smokeless tobacco: Never Used   Substance and Sexual Activity   . Alcohol use: No   . Drug use: No   . Sexual activity: Not on file   Lifestyle   . Physical activity     Days per week: Not on file     Minutes per session: Not on file   . Stress: Not on file   Relationships   . Social connections     Talks on phone: Not on file     Gets together: Not on file     Attends religious service: Not on file     Active member of club or organization: Not on file     Attends meetings of clubs or organizations: Not on file     Relationship status: Not on file   . Intimate partner violence     Fear of current or ex partner: Not on file     Emotionally abused: Not on file     Physically abused: Not on file     Forced sexual activity: Not on file   Other Topics Concern   . Not on file   Social History Narrative   . Not on file       Physical Exam:   Vitals:    10/14/18 1456   BP: 133/80   BP Location: Left arm   BP Patient Position: Sitting   BP cuff  size: Regular   Pulse: 74   Resp: 17   Temp: 98 F (36.7 C)   TempSrc: Temporal   SpO2: 95%   Weight: 87.5 kg (192 lb 12.8 oz)   Height: 6' (1.829 m)       GENERAL: The patient is alert, cooperative and in no acute distress.   HEENT: normalcephalic/atraumatic  NECK: midline trachea. No jugular venous distention  PULM: no evidence of labored   breathing  GI: soft, NT, ND, no massess.  No hepatosplenomegaly  BACK: No CVAT  EXTREMITES: Joints are normal without redness or swelling.   SKIN: There is no edema or cyanosis.   NEURO: no motor or sensory deficits.  Alert and Oriented x 3    Labs/Diagnostic X-rays:  Lab Results   Component Value Date    WBC 7.6 07/11/2017    RBC 5.39 07/11/2017    HGB 16.7 07/11/2017    HCT 51.8 (H) 07/11/2017    MCV 96.1 (H) 07/11/2017    MCHC 32.2 07/11/2017    RDW 13.2 07/11/2017    PLT 221 07/11/2017    MPV 10.5 07/11/2017    LYMPHS 35 07/11/2017    MONOS 8 07/11/2017    EOS 3 07/11/2017    BASOS 1 07/11/2017       Lab Results   Component Value Date    BUN 14 07/11/2017    CREAT 0.91 07/11/2017    CL 105 07/11/2017    NA 143 07/11/2017    K 4.6 07/11/2017    [place] 9.8 07/11/2017    TBILI 1.20 (H) 04/16/2017    ALB 3.8 04/16/2017    TP 7.3 04/16/2017    AST 21 04/16/2017    ALK 59 04/16/2017    BICARB 29 07/11/2017    ALT 21 04/16/2017    GLU 87 07/11/2017       Lab Results   Component Value Date    PSA 1.72 08/15/2017       No results found for: TESTOSTERONE    Lab Results   Component Value Date    COLORUA Yellow 05/17/2017    APPEARUA Hazy 05/17/2017    GLUCOSEUA Negative 05/17/2017    BILIUA Negative 05/17/2017    KETONEUA Negative 05/17/2017    SGUA 1.017 05/17/2017    BLOODUA Negative 05/17/2017    PHUA 6.0 05/17/2017    PROTEINUA 1+ (A) 05/17/2017    UROBILUA Negative 05/17/2017    NITRITEUA Negative 05/17/2017    LEUKESTUA 3+ (A) 05/17/2017    WBCUA >50 (A) 05/17/2017    RBCUA 0-2 05/17/2017         Assessment/Plan:   68 year old gentleman being followed for multiple urologic  concerns.    Lower urinary tract symptoms-he is status post needle ablation of the prostate and is notice worsening urinary tract symptoms with spraying of urination  -he has an upcoming cystoscopy in September.     Prostate cancer-he states that his PSAs have consistently been in the 1.3 range and will be bringing these lab results to a cystoscopy in September.  -we will continue to watch him closely of for this.    Recurrent UTI-this is not been a problem for hi this is likely reflection m  -this is likely a reflection of his diverticulectomy    Thank you for allowing me to participate in this patient's care.   Sincerely,   Patty Sermons Aalani Aikens

## 2018-10-14 ENCOUNTER — Ambulatory Visit: Payer: Medicare Other | Attending: Urology | Admitting: Urology

## 2018-10-14 VITALS — BP 133/80 | HR 74 | Temp 98.0°F | Resp 17 | Ht 72.0 in | Wt 192.8 lb

## 2018-10-14 DIAGNOSIS — N390 Urinary tract infection, site not specified: Secondary | ICD-10-CM | POA: Insufficient documentation

## 2018-10-14 DIAGNOSIS — N401 Enlarged prostate with lower urinary tract symptoms: Secondary | ICD-10-CM | POA: Insufficient documentation

## 2018-10-14 DIAGNOSIS — C61 Malignant neoplasm of prostate: Secondary | ICD-10-CM | POA: Insufficient documentation

## 2018-11-04 ENCOUNTER — Ambulatory Visit: Payer: Medicare Other | Attending: Urology | Admitting: Urology

## 2018-11-04 ENCOUNTER — Encounter (HOSPITAL_BASED_OUTPATIENT_CLINIC_OR_DEPARTMENT_OTHER): Payer: Self-pay | Admitting: Urology

## 2018-11-04 VITALS — BP 122/61 | HR 82 | Temp 97.5°F | Resp 16 | Ht 72.0 in | Wt 193.0 lb

## 2018-11-04 DIAGNOSIS — N401 Enlarged prostate with lower urinary tract symptoms: Secondary | ICD-10-CM | POA: Insufficient documentation

## 2018-11-04 DIAGNOSIS — R399 Unspecified symptoms and signs involving the genitourinary system: Secondary | ICD-10-CM | POA: Insufficient documentation

## 2018-11-04 MED ORDER — LIDOCAINE HCL 2% EX GEL (UROJET)
Status: AC
Start: 2018-11-04 — End: 2018-11-04
  Filled 2018-11-04: qty 10

## 2018-11-04 MED ORDER — LIDOCAINE HCL 2% EX GEL (UROJET)
10.00 mL | Freq: Once | Status: AC
Start: 2018-11-04 — End: 2018-11-05

## 2018-11-04 NOTE — Interdisciplinary (Signed)
A Time Out was conducted immediately prior to the procedure in agreement with the patient/team confirming: patient name, MRN and date of birth, consent, procedure, site, side, marking, and correct equipment/implant, if applicable.

## 2018-11-04 NOTE — Patient Instructions (Signed)
Patient Instructions - Cystoscopy After Care     Post-Procedure Information: For approximately 8-12 hours following the cystoscopy, you might have to urinate more frequently than usual and you may experience burning during urination. These minor discomforts should go away within 12-24 hours. If they continue, contact your doctor.     Your urine may be slightly red following your cystoscopy, this is normal. However, if your urine is bright red or if you are passing clots, call the Urology Clinic. If you are unable to urinate, try sitting in a tub of warm water to help you relax. Urinate into the bath water.      If you are still unable to urinate, call the Urology Clinic and ask to speak to the Urology Nurse.     If you develop a fever and/or chills, you may have an infection. Continue to take any antibiotics your doctor may have prescribed. If you were discharged without a prescription for antibiotics, call your doctor immediately. Do not use an old prescription.     If you break out in a rash, you may be having an allergic reaction to a drug. Call your doctor immediately. Discontinue taking your medication until you have been advised by your doctor.    Severe pain in the lower abdomen or low back may be an indication of bladder spasms or an infection. Contact the doctor if you experience continued discomfort.    Call the clinic and ask to speak to the nurse if you experience any of the following:  · bright red bleeding or blood clots in your urine  · burning or frequent urination after 24 hours   · inability to urinate  · fever and/or chills  · severe pain in lower abdomen or low back  · you break out in a rash     If you have any questions and it is after business hours of 8am to 4:30pm, please call the page operator at # [phone] and ask to speak to the provider on-call.

## 2018-11-04 NOTE — Procedures (Signed)
Pedro Shannon presents today for cystourethroscopy.     Indications:  68 year old male presents for evaluation of LUTS.    Procedure: After risks, benefits, alternatives, and indictaions were explained and consent was obtained, the patient was brought into the procedure room. Patient was then placed in the supine position, prepped with betadine and sterilely draped. Timeout performed confirming patient name, MRN, and procedure. Local anesthetic was administered per urethra with 2% lidocaine gel. The urethral meatus was extremely tight and was dilated to 22 french.The urethra was cannulated with the flexible cystoscope. A false passage was again noted with the true lumen at the 2 O'Clock position, the bladder was meticulously surveyed; no tumors, stones, diverticulum, or lesions were identified.  The bilateral ureteral orifices were normal.  Upon retroflexion of the cystoscope, the bladder neck was normal other than a intravesical prostate which was scarring down. The urethra appeared normal, no lesions noted.  A bladder wash cytology was sent.  The patient tolerated the procedure well, blood loss was minimal.  I, the attending, was present and scrubbed for the entire procedure.    Findings:  Urethral false passsage   Meatal stricture    Patient tolerated the procedure and there were no complications.    Assessment/Plan:    LUTS likely related to meatal stricture; Follow up for reassessment in 3 months

## 2019-02-03 ENCOUNTER — Ambulatory Visit (HOSPITAL_BASED_OUTPATIENT_CLINIC_OR_DEPARTMENT_OTHER): Payer: Medicare Other | Admitting: Urology

## 2019-02-11 NOTE — Patient Instructions (Addendum)
Plan:  . Please follow-up with me in 6 months.  Please get a PSA before your visit with me      An important part of providing quality healthcare is to understand your values and what is meaningful in your life. We believe this helps [place] to provide you more personalized care. We also believe that each patient should have a person identified who understands your wishes and can speak for you in the case of emergency.     Please visit [email or web address] to find more information and resources regarding Advanced Care Planning.       Morrison Old, MD, phD, [facility]  Professor of Surgery  Department of Urology    [facility] - Urology  Monday-Friday 8:00- 5:00p.m. (Closed Holidays and Weekends)    For any question or to schedule any appointment call [phone]    AFTER HOURS EMERGENCY NUMBER [phone]  Ask for the Urologist/Oncologist Physician On-Call.    Thank you for allowing [place] to participate in your care today! We value your feedback as we strive to provide every patient with an exceptional care experience.   You may receive a patient satisfaction survey in the mail in the next few weeks. Please fill out the survey upon receipt and return it in the self-addressed envelope. Your feedback will be used to help improve the experience for all our patients at [facility].   If you want to share a positive experience you had at our facility please call [facility] at [phone]. If you have any suggestions on how we can improve our services please call the [facility] leadership team at [phone].    For medical questions or emergencies, please call your physician's office or 911. Thank you!

## 2019-02-11 NOTE — Progress Notes (Signed)
Reason for visit:   No chief complaint on file.    Demographics:               Patient Name: Pedro Shannon       Medical Record #: [id number]       DOB: 11/12/1950       Age: 68 year old       Sex: male    [address], [name] [place], [place] [id number]  [address]    HPI:    Pedro Shannon is a 68 year old male with no family hx of prostate cancer presenting for reassessment of lower urinary tract symptoms status post REZUM 07/18/2016, bladder diverticula (SP bladder diverticulectomy x2 from 04/10/2017 for benign disease), recurrent UTI, low risk prostate cancer on active surveillance (5% left lateral mid core involved with Gleason 3 + 3 prostate cancer on biopsy 10/28/15).    UROLOGIC HISTORY:  Prostate cancer:  OSH  4K Score 02/26/15:  24% - high risk    07/15/15 - 2.4  01/06/15 - 4.3  07/01/14 - 3.0  11/19/13 - 3.7  (% free 9%)  12/19/11 - 3.4  08/03/11 - 3.3  12/18/10 - 3.1  06/17/10 - 3.0    MRI 05/30/15 - PIRADS 4 lesion involving left base with possible extracapsular extension    Negative prostate biopsy 06/16/15     [place]:  MRI 08/25/2015 (50 cc prostate):  IMPRESSION:  1. Two PI-RADS 3 lesions, in the left prostatic transition zone mid-gland to   base, and in the right prostatic mid-gland, both possibly also involving the   peripheral zone.    2. No definite extra-prostatic extension.    3. Multiple incidental findings including bilateral renal hernias, bladder   diverticula, bladder wall thickening, and colonic diverticuli.    5% left lateral mid core involved with Gleason 3 + 3 prostate cancer on biopsy 10/28/15    His current LUTS include: frequency q2-4h day, nocturia of 2-3 times, no dysuria, no hematuria, no urgency, no incontinence, weak/spraying urinary stream, no sensation of post void residual.           [facility] IPSS REVIEW FS 10/12/2016 08/30/2016 06/07/2016 11/10/2015   Incomplete Emptying 4 3 4 3    Frequency 5 2 4 3    Intermittencey 2 2 4  0   Urgency 4 3 4 2    Weak Stream 4 1 4  1    Straining 2 1 3  0   Nocturia 4 3 3 3    IPSS Total Score 25 15 26 12    Quality of Life 6 4 5 5            Lab Results   Component Value Date/Time    PSA 1.72 08/15/2017 11:21 AM    PSA 2.12 05/08/2016 01:15 PM    PSA 2.92 07/26/2015 03:04 PM        PSA 3.2 01/09/16.    FINAL PATHOLOGIC DIAGNOSIS 10/28/15 reviewed personally:  A: Prostate, right lateral apex, biopsy  -Benign prostatic tissue.  -See comment.  B: Prostate, right medial apex, biopsy       -Benign prostatic tissue.  C: Prostate, right lateral mid, biopsy       -Benign fibromuscular tissue.  D: Prostate, right medial mid, biopsy       -Benign prostatic tissue.       -See comment.  E: Prostate, right lateral base, biopsy       -Benign prostatic  tissue.  F: Prostate, right medial base, biopsy       -Benign prostatic tissue.  G: Prostate, left lateral apex, biopsy       -Benign prostatic tissue.  H: Prostate, left medial apex, biopsy       -Benign prostatic tissue with mild acute inflammation.  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  -See comment.  J: Prostate, left medial mid, biopsy       -Benign prostatic tissue.  K: Prostate, left lateral base, biopsy       -Benign prostatic tissue.  L: Prostate, left medial base, biopsy       -Benign prostatic tissue.       -Seminal vesicle/ejaculatory duct tissue identified.  M: Prostate, target #1, biopsy       -Benign prostatic tissue.  N: Prostate, target #2, biopsy  -Prostatic tissue with a small focus of atypical glands, cannot exclude  prostatic adenocarcinoma.       -See comment.    MRI PELVIS WO/W CONTRAST 08/25/15 reviewed personally:  1. Two PI-RADS 3 lesions, in the left prostatic transition zone mid-gland to   base, and in the right prostatic mid-gland, both possibly also involving the   peripheral zone.    2. No definite extra-prostatic extension.    3. Multiple incidental findings  including bilateral renal hernias, bladder   diverticula, bladder wall thickening, and colonic diverticuli.    Outside Records:  PSA  07/15/15 - 2.4  01/06/15 - 4.3  07/01/14 - 3.0  11/19/13 - 3.7  (% free 9%)  12/19/11 - 3.4  08/03/11 - 3.3  12/18/10 - 3.1  06/17/10 - 3.0    4K Score 02/26/15:    24% - high risk    Pathology 06/16/15:  A.  L Base  Atypical small acinar proliferation  B.  L Lateral base  Benign prostatic tissue  C.  L Mid  Atypical small acinar proliferation  D.  L Lateral Mid  Benign prostatic tissue  E.  L Apex  Benign prostatic tissue  F.  L Lateral Apex  Benign prostatic tissue  G.  R Base  Benign prostatic tissue  H.  R Lateral base  Benign prostatic tissue  I.  R Mid  Atypical small acinar proliferation  J.  R Lateral Mid  Benign prostatic tissue  K.  R Apex  Benign prostatic tissue  L.  R Lateral Apex  Benign prostatic tissue    MRI 05/30/15:  PIRADS 4 lesion involving left base with possible extracapsular extension            Wellbeing assessment:  Wellbeing screening reviewed.    Pt indicated no needs.    Past Medical History:   Diagnosis Date   . Prostate cancer (CMS-HCC)        Past Surgical History:   Procedure Laterality Date   . NASAL SEPTOPLASTY     . R inguinal hernia     . SUPRAPUBIC TUBE PLACEMENT         Allergies   Allergen Reactions   . Dutasteride Hives   . Rogaine [Minoxidil] Hives       Current Outpatient Medications   Medication Sig Dispense Refill   . docusate sodium (COLACE) 100 MG capsule TAKE ONE CAPSULE BY MOUTH TWICE DAILY 60 each 0   . sulfamethoxazole-trimethoprim (BACTRIM DS, SEPTRA DS) 800-160 MG  tablet Take 1 tablet by mouth 2 times daily. 28 tablet 0   . tamsulosin (FLOMAX) 0.4 MG capsule Take 0.4 mg by mouth daily.       No current facility-administered medications for this visit.        Social History     Socioeconomic History   . Marital status: Married     Spouse name: Not on file   . Number of children: Not on file   . Years of education: Not on file   . Highest  education level: Not on file   Occupational History   . Occupation: Market researcher   . Financial resource strain: Not on file   . Food insecurity     Worry: Not on file     Inability: Not on file   . Transportation needs     Medical: Not on file     Non-medical: Not on file   Tobacco Use   . Smoking status: Never Smoker   . Smokeless tobacco: Never Used   Substance and Sexual Activity   . Alcohol use: No   . Drug use: No   . Sexual activity: Not on file   Lifestyle   . Physical activity     Days per week: Not on file     Minutes per session: Not on file   . Stress: Not on file   Relationships   . Social Product manager on phone: Not on file     Gets together: Not on file     Attends religious service: Not on file     Active member of club or organization: Not on file     Attends meetings of clubs or organizations: Not on file     Relationship status: Not on file   . Intimate partner violence     Fear of current or ex partner: Not on file     Emotionally abused: Not on file     Physically abused: Not on file     Forced sexual activity: Not on file   Other Topics Concern   . Not on file   Social History Narrative   . Not on file       Physical Exam:   There were no vitals filed for this visit.    GENERAL: The patient is alert, cooperative and in no acute distress.   HEENT: normalcephalic/atraumatic  NECK: midline trachea. No jugular venous distention  PULM: no evidence of labored breathing  GI: soft, NT, ND, no massess.  No hepatosplenomegaly  BACK: No CVAT  EXTREMITES: Joints are normal without redness or swelling.   SKIN: There is no edema or cyanosis.   NEURO: no motor or sensory deficits.  Alert and Oriented x 3    Labs/Diagnostic X-rays:  Lab Results   Component Value Date    WBC 7.6 07/11/2017    RBC 5.39 07/11/2017    HGB 16.7 07/11/2017    HCT 51.8 (H) 07/11/2017    MCV 96.1 (H) 07/11/2017    MCHC 32.2 07/11/2017    RDW 13.2 07/11/2017    PLT 221 07/11/2017    MPV 10.5 07/11/2017    LYMPHS 35  07/11/2017    MONOS 8 07/11/2017    EOS 3 07/11/2017    BASOS 1 07/11/2017       Lab Results   Component Value Date    BUN 14 07/11/2017    CREAT 0.91 07/11/2017  CL 105 07/11/2017    NA 143 07/11/2017    K 4.6 07/11/2017    [place] 9.8 07/11/2017    TBILI 1.20 (H) 04/16/2017    ALB 3.8 04/16/2017    TP 7.3 04/16/2017    AST 21 04/16/2017    ALK 59 04/16/2017    BICARB 29 07/11/2017    ALT 21 04/16/2017    GLU 87 07/11/2017       Lab Results   Component Value Date    PSA 1.72 08/15/2017       No results found for: TESTOSTERONE    Lab Results   Component Value Date    COLORUA Yellow 05/17/2017    APPEARUA Hazy 05/17/2017    GLUCOSEUA Negative 05/17/2017    BILIUA Negative 05/17/2017    KETONEUA Negative 05/17/2017    SGUA 1.017 05/17/2017    BLOODUA Negative 05/17/2017    PHUA 6.0 05/17/2017    PROTEINUA 1+ (A) 05/17/2017    UROBILUA Negative 05/17/2017    NITRITEUA Negative 05/17/2017    LEUKESTUA 3+ (A) 05/17/2017    WBCUA >50 (A) 05/17/2017    RBCUA 0-2 05/17/2017         Assessment/Plan:   68 year old gentleman well known to me for low-grade low risk prostate cancer on active surveillance and lower urinary tract symptoms status post diverticulectomy and steam based needle ablation of the prostate.    Prostate cancer-he has been reluctant to undergo repeat biopsy.  -his PSAs have been stable and under 1.7.  -he is to forward [place] his PSA results that he is recently had.    Lower urinary tract symptoms-these are somewhat improved  -he has had problems with irritative symptoms which are somewhat better and obstructive symptoms which may be somewhat worse.    He is to follow up with me in 6 months for reassessment of his symptoms. He is to get a PSA at that time.    Thank you for allowing me to participate in this patient's care.   Sincerely,   Pedro Shannon    ---------------------(data below generated by A. Morrison Old MD, PhD, [facility])--------------------     Patient Verification & Telemedicine Consent & Financial  Waiver:    1.   Identity: I have verified this patient's identity to be accurate.  2.   Consent: I verify consent has been secured in one of the following methods: (a) obtained written/ online attestation consent (via MyChartVideoVisit pathway), (b) the spoke-side provider has obtained verbal or written consent from patient/surrogate (if this is a "provider to provider" evaluation), or (c) in all other cases, I have personally obtained verbal consent from the patient/ surrogate (noting all elements below) to perform this voluntary telemedicine evaluation (including obtaining history, performing examination and reviewing data provided by the patient).   The patient/ surrogate has the right to refuse this evaluation.  I have explained risks (including potential loss of confidentiality), benefits, alternatives, and the potential need for subsequent face to face care. Patient/ surrogate understands that there is a risk of medical inaccuracies given that our recommendations will be made based on reported data (and we must therefore assume this information is accurate).  Knowing that there is a risk that this information is not reported accurately, and that the telemedicine video, audio, or data feed may be incomplete, the patient agrees to proceed with evaluation and holds [place] harmless knowing these risks.  3.   Healthcare Team: The patient/ surrogate has been notified that other healthcare professionals (  including students, residents and Metallurgist) may be involved in this audio-video evaluation.   All laws concerning confidentiality and patient access to medical records and copies of medical records apply to telemedicine.  4.   Privacy: If this is a Radiographer, therapeutic Visit, the patient/ surrogate has received the [employer] Notice of Privacy Practices via E-Checkin process.  For all other video visit techniques, I have verbally provided the patient/ surrogate with the [place] in [place]  (https://health.PodcastRanking.se.aspx) or Spanish (https://health.https://www.matthews.info/.aspx).  The patient/ surrogate acknowledges both being provided the NPP link, and has been offered to have the NPP mailed to the patient/ surrogate by [place] mail.  The patient/ surrogate has voiced understanding an acknowledgement of receipt of this NPP web address.  If the patient/surrogate has elected to receive the NPP via [place] mail, I verify that the NPP will be sent promptly to the patient/surrogate via [place] mail.  5.   Capacity: I have reviewed this above verification and consent paragraph with the patient/ surrogate and the patient is capacitated or has a surrogate. If the patient is not capacitated to understand the above, and no surrogate is available, since this is not an emergency evaluation, the visit will be rescheduled until such time that the patient can consent, or the surrogate is available to consent. If this is an emergency evaluation and the patient is not capacitated to understand the above, and no surrogate is available, I am proceeding with this evaluation as this is felt to be an emergency setting and no appropriate specialist is available at the bedside to perform these evaluations.  6.   Financial Waiver: If this is a Radiographer, therapeutic Visit, the patient has been made aware of the financial waiver via E-Checkin process.  For all other video visit techniques, an E-Checkin process is not performed.  As such, I have personally verbally informed the patient/ surrogate that this evaluation will be a billable encounter similar to an in-person clinic visit, and the patient/ surrogate has agreed to pay the fee for services rendered.  If we are billing insurance for the patient's telehealth visit, his out-of-pocket cost will be determined based on his plan and will be billed to him.  The patient/ surrogate has also been informed that if the patient does not have insurance or does not wish to use insurance,  [place] [employer] price for a primary care telehealth visit is $59.00 and specialist telehealth visit is $88.00.  I have further informed the patient/ surrogate that in the event the patient has additional services provided in conjunction with the specialty visit (Ex. Psychotherapy services), those services will be billed at the current rate less a 45% discount.  7.   Intra-State Location: The patient/ surrogate attests to understanding that if the patient accesses these services from a location outside of [place], that the patient does so at the patient's own risk and initiative and that the patient is ultimately responsible for compliance with any laws or regulations associated with the patient's use.  8.   Specific Use:The patient/ surrogate understands that [facility] makes no representation that materials or servicesdelivered via telecommunication services, or listed on telemedicine websites, are appropriate or available for use in any other location.           Demographics:  Medical Record #: [id number]  Date: February 11, 2019  Patient Name: Pedro Shannon  DOB: [birth year]  Age: 68 year old  Sex: male  Location: Home address on file  Evaluator(s):  Pedro Shannon was evaluated by me today.    Clinic Location: [facility]   [facility]  [address]  [place] [address]

## 2019-02-12 ENCOUNTER — Ambulatory Visit: Payer: Medicare Other | Attending: Urology | Admitting: Urology

## 2019-02-12 DIAGNOSIS — C61 Malignant neoplasm of prostate: Secondary | ICD-10-CM | POA: Insufficient documentation

## 2019-02-12 DIAGNOSIS — R399 Unspecified symptoms and signs involving the genitourinary system: Secondary | ICD-10-CM | POA: Insufficient documentation

## 2019-02-16 ENCOUNTER — Telehealth (HOSPITAL_BASED_OUTPATIENT_CLINIC_OR_DEPARTMENT_OTHER): Payer: Self-pay | Admitting: Urology

## 2019-02-16 NOTE — Telephone Encounter (Signed)
Called left a message to contact Urology, schedule a 6 months follow up with Dr Nani Skillern

## 2019-03-13 ENCOUNTER — Encounter: Payer: Self-pay | Admitting: Hospital

## 2019-04-06 ENCOUNTER — Encounter (INDEPENDENT_AMBULATORY_CARE_PROVIDER_SITE_OTHER): Payer: Self-pay | Admitting: Hospital

## 2019-04-06 ENCOUNTER — Encounter (INDEPENDENT_AMBULATORY_CARE_PROVIDER_SITE_OTHER): Payer: Self-pay

## 2019-04-14 ENCOUNTER — Encounter (INDEPENDENT_AMBULATORY_CARE_PROVIDER_SITE_OTHER): Payer: Self-pay | Admitting: Hospital

## 2019-04-24 ENCOUNTER — Ambulatory Visit (INDEPENDENT_AMBULATORY_CARE_PROVIDER_SITE_OTHER): Payer: Medicare Other

## 2019-04-24 ENCOUNTER — Encounter: Payer: Self-pay | Admitting: Hospital

## 2019-05-04 ENCOUNTER — Ambulatory Visit: Payer: Medicare Other

## 2019-05-08 ENCOUNTER — Ambulatory Visit: Payer: Medicare Other

## 2019-07-21 ENCOUNTER — Telehealth (INDEPENDENT_AMBULATORY_CARE_PROVIDER_SITE_OTHER): Payer: Self-pay | Admitting: General Surgery

## 2019-07-21 NOTE — Telephone Encounter (Signed)
Patient is scheduled for in-clinic appointment.    Appointment scheduled for 06/22/19    Do you have or have` you had in the last 24 hours: :  Fever: N  New cough (not chronic) :  N  Shortness of breath:  N  If yes to any of these, we will not schedule and route an encounter to department specific triage for both NEW and RET patients to assess  If patient has an appointment, we will cancel the appointment & route a message to the triage pool.    Have you knowingly been exposed to anyone having any, some or all of the symptoms listed above?   N    Have you traveled outside of the [place] in the last 14 days? Mar[place] Kitchen   N    If scheduling appointment: schedule according to specific clinic guidelines.    If no to all questions, document and close encounter.  Do not route.      Did you inform patient of no visitor policy Y

## 2019-07-31 ENCOUNTER — Ambulatory Visit (INDEPENDENT_AMBULATORY_CARE_PROVIDER_SITE_OTHER): Payer: Medicare Other | Admitting: General Surgery

## 2019-07-31 ENCOUNTER — Other Ambulatory Visit: Payer: Self-pay

## 2019-07-31 VITALS — BP 148/96 | HR 96 | Temp 96.9°F | Ht 71.65 in | Wt 194.3 lb

## 2019-07-31 DIAGNOSIS — R1900 Intra-abdominal and pelvic swelling, mass and lump, unspecified site: Secondary | ICD-10-CM

## 2019-07-31 NOTE — Progress Notes (Signed)
Minimally Invasive Surgery New Consultation  Date: July 31, 2019     Patient Name: Pedro Shannon   Medical Record #: [id number]   DOB: [birth year]  Age: 69 year old  Sex: male    Referring MD: Self, Referred  No address on file    PCP: Pedro Shannon    Reason for Visit: No chief complaint on file.    History of Present Illness:     Pedro Shannon is a 69 year old male, BMI 26.6, who is here for evaluation of right abdominal bulge    H/O bladder diverticula s/p robotic bladder diverticulectomy x2 on 04/10/2017 for benign disease who developed an abdominal bulge shortly after mentioned surgery. Bulge has increased in size over this time. He had a severe painful episode a few weeks ago that was debilitating. Bulge is typically reducible. Denies n/v, obstructive symptoms. No imaging since bulge developed.     PMH includes: prostate CA (under surveillance), Bladder diverticula s/p bladder diverticulectomy x2 (04/10/2017 for benign disease), LUTS s/p REZUM (2018), outlet obstruction requiring suprapubic catheter (now removed)    Previous Abdominal Surgeries Include:   Robotic bladder diverticula  SP catheter, removed  REZUM  Lap RIHR x 2    Past Medical History  Past Medical History:   Diagnosis Date    Prostate cancer (CMS-HCC)        Past Surgical History  Past Surgical History:   Procedure Laterality Date    NASAL SEPTOPLASTY      R inguinal hernia      SUPRAPUBIC TUBE PLACEMENT         Allergies  Allergies   Allergen Reactions    Dutasteride Hives    Rogaine [Minoxidil] Hives       Medications  Current Outpatient Medications   Medication Sig Dispense Refill    docusate sodium (COLACE) 100 MG capsule TAKE ONE CAPSULE BY MOUTH TWICE DAILY 60 each 0    sulfamethoxazole-trimethoprim (BACTRIM DS, SEPTRA DS) 800-160 MG tablet Take 1 tablet by mouth 2 times daily. 28 tablet 0    tamsulosin (FLOMAX) 0.4 MG capsule Take 0.4 mg by mouth daily.       No current facility-administered medications for this  visit.       Social History  Social History     Socioeconomic History    Marital status: Married     Spouse name: Not on file    Number of children: Not on file    Years of education: Not on file    Highest education level: Not on file   Occupational History    Occupation: Chief Executive Officer   Tobacco Use    Smoking status: Never Smoker    Smokeless tobacco: Never Used   Substance and Sexual Activity    Alcohol use: No    Drug use: No    Sexual activity: Not on file   Social Activities of Daily Living Present    Not on file   Social History Narrative    Not on file       Family History  Family History   Problem Relation Name Age of Onset    Lung Cancer Mother      Melanoma Cancer Mother      Other Father          esophageal cancer       Review of Systems    As described in HPI.    Physical Examination  BP (!) 148/96 (BP Location: Left arm, BP  Patient Position: Sitting, BP cuff size: Regular)    Pulse 96    Temp 96.9 F (36.1 C) (Temporal)    Ht 5' 11.65" (1.82 m)    Wt 88.1 kg (194 lb 4.8 oz)    SpO2 93%    BMI 26.61 kg/m  Body mass index is 26.61 kg/m.  GENERAL: alert, oriented x 3, no acute distress   HEENT: Normocephalic, atraumatic  CHEST: unlabored  ABDOMEN: soft, non tender; + small bulge associated near a previous trocar site that is soft, semi reduced with firm pressure but was TTP.  EXTREMITIES: no cyanosis  NEURO: normal speech    Diagnostic Testing/Labs:  Lab Results   Component Value Date    WBC 7.6 07/11/2017    RBC 5.39 07/11/2017    HGB 16.7 07/11/2017    HCT 51.8 (H) 07/11/2017    MCV 96.1 (H) 07/11/2017    MCHC 32.2 07/11/2017    RDW 13.2 07/11/2017    PLT 221 07/11/2017    MPV 10.5 07/11/2017    SEG 53 07/11/2017    LYMPHS 35 07/11/2017    MONOS 8 07/11/2017    EOS 3 07/11/2017    BASOS 1 07/11/2017     Lab Results   Component Value Date    BUN 14 07/11/2017    CREAT 0.91 07/11/2017    CL 105 07/11/2017    NA 143 07/11/2017    K 4.6 07/11/2017    CA 9.8 07/11/2017    TBILI 1.20 (H) 04/16/2017     ALB 3.8 04/16/2017    TP 7.3 04/16/2017    AST 21 04/16/2017    ALK 59 04/16/2017    BICARB 29 07/11/2017    ALT 21 04/16/2017    GLU 87 07/11/2017     Lab Results   Component Value Date    NA 143 07/11/2017    K 4.6 07/11/2017    CL 105 07/11/2017    BICARB 29 07/11/2017    BUN 14 07/11/2017    CREAT 0.91 07/11/2017    GLU 87 07/11/2017    CA 9.8 07/11/2017     No results found for: A1C  No results found for: CHOL, HDL, LDLCALC, TRIG, LDLDIRECT  No results found for: TSH    Assessment/Plan:  **Pt was seen/examined in conjunction with Dr. Hillery Hunter.**    Pedro Shannon is a 69 year old male, BMI 26.6, with h/o prostate CA (under surveillance), LUTS s/p REZUM (2018), outlet obstruction requiring suprapubic catheter (now removed), and bladder diverticula s/p robotic bladder diverticulectomy x2 on 04/10/2017 for benign disease with symptoms c/f ventral, possible incisional, hernia vs lipoma.    Discussed further imaging needed. Discussed if imaging does identify a hernia a robotic approach would likely be best approach to repair.     - Check CTAP w/o contrast eval hernia defect  - FU to review resutls (video appt ok)    Orders:   No orders of the defined types were placed in this encounter.      Note Author: Christianne Borrow, Nurse Practitioner

## 2019-07-31 NOTE — Patient Instructions (Signed)
Call [phone] to make CT appt    Make a follow up video appt to review CT results    [place] number is [phone] or send [place] a MyChart message.

## 2019-08-15 ENCOUNTER — Ambulatory Visit
Admission: RE | Admit: 2019-08-15 | Discharge: 2019-08-15 | Disposition: A | Discharge status: Home / Self Care | Payer: Medicare Other | Attending: Nurse Practitioner | Admitting: Nurse Practitioner

## 2019-08-15 ENCOUNTER — Other Ambulatory Visit: Payer: Self-pay

## 2019-08-15 DIAGNOSIS — K439 Ventral hernia without obstruction or gangrene: Secondary | ICD-10-CM

## 2019-08-15 DIAGNOSIS — R1900 Intra-abdominal and pelvic swelling, mass and lump, unspecified site: Secondary | ICD-10-CM

## 2019-08-15 DIAGNOSIS — K4020 Bilateral inguinal hernia, without obstruction or gangrene, not specified as recurrent: Secondary | ICD-10-CM

## 2019-08-28 ENCOUNTER — Encounter (INDEPENDENT_AMBULATORY_CARE_PROVIDER_SITE_OTHER): Payer: Self-pay | Admitting: General Surgery

## 2019-08-28 ENCOUNTER — Other Ambulatory Visit: Payer: Self-pay

## 2019-08-28 ENCOUNTER — Ambulatory Visit (INDEPENDENT_AMBULATORY_CARE_PROVIDER_SITE_OTHER): Payer: Medicare Other | Admitting: General Surgery

## 2019-08-28 VITALS — BP 137/78 | HR 75 | Temp 97.7°F | Ht 72.0 in | Wt 194.3 lb

## 2019-08-28 DIAGNOSIS — K439 Ventral hernia without obstruction or gangrene: Secondary | ICD-10-CM

## 2019-08-28 NOTE — Progress Notes (Signed)
Minimally Invasive Surgery New Consultation  Date: August 28, 2019     Patient Name: Pedro Shannon   Medical Record #: [id number]   DOB: December 28, 1950  Age: 69 year old  Sex: male    Referring MD: Self, Referred  No address on file    PCP: Cleopatra Cedar    Reason for Visit:   Chief Complaint   Patient presents with    Recheck     RET-Abd Buldge f/u appt to review CT scan      History of Present Illness:     Pedro Shannon is a 69 year old male, BMI 26.6, who is here for evaluation of right abdominal bulge    H/O bladder diverticula s/p robotic bladder diverticulectomy x2 on 04/10/2017 for benign disease who developed an abdominal bulge shortly after mentioned surgery. Bulge has increased in size over this time. He had a severe painful episode a few weeks ago that was debilitating. Bulge is typically reducible. Denies n/v, obstructive symptoms.     Returns today to review CT scan results and sign consents for surgery.    PMH includes: prostate [place] (under surveillance), Bladder diverticula s/p bladder diverticulectomy x2 (04/10/2017 for benign disease), LUTS s/p REZUM (2018), outlet obstruction requiring suprapubic catheter (now removed)    Previous Abdominal Surgeries Include:   Robotic bladder diverticula  SP catheter, removed  REZUM  Lap RIHR x 2    Past Medical History  Past Medical History:   Diagnosis Date    Prostate cancer (CMS-HCC)        Past Surgical History  Past Surgical History:   Procedure Laterality Date    NASAL SEPTOPLASTY      R inguinal hernia      SUPRAPUBIC TUBE PLACEMENT         Allergies  Allergies   Allergen Reactions    Dutasteride Hives    Rogaine [Minoxidil] Hives       Medications  Current Outpatient Medications   Medication Sig Dispense Refill    docusate sodium (COLACE) 100 MG capsule TAKE ONE CAPSULE BY MOUTH TWICE DAILY 60 each 0    sulfamethoxazole-trimethoprim (BACTRIM DS, SEPTRA DS) 800-160 MG tablet Take 1 tablet by mouth 2 times daily. 28 tablet 0     tamsulosin (FLOMAX) 0.4 MG capsule Take 0.4 mg by mouth daily.       No current facility-administered medications for this visit.       Social History  Social History     Socioeconomic History    Marital status: Married     Spouse name: Not on file    Number of children: Not on file    Years of education: Not on file    Highest education level: Not on file   Occupational History    Occupation: Chief Executive Officer   Tobacco Use    Smoking status: Never Smoker    Smokeless tobacco: Never Used   Substance and Sexual Activity    Alcohol use: No    Drug use: No    Sexual activity: Not on file   Social Activities of Daily Living Present    Not on file   Social History Narrative    Not on file       Family History  Family History   Problem Relation Name Age of Onset    Lung Cancer Mother      Melanoma Cancer Mother      Other Father  esophageal cancer       Review of Systems    As described in HPI.    Physical Examination  BP 137/78 (BP Location: Left arm, BP Patient Position: Sitting, BP cuff size: Regular)    Pulse 75    Temp 97.7 F (36.5 C) (Temporal Artery)    Ht 6' (1.829 m)    Wt 88.1 kg (194 lb 4.8 oz)    SpO2 96%    BMI 26.35 kg/m  Body mass index is 26.35 kg/m.  GENERAL: alert, oriented x 3, no acute distress   HEENT: Normocephalic, atraumatic  CHEST: unlabored  ABDOMEN: soft, non tender; +small bulge associated near a previous trocar site that is soft, semi reduced with firm pressure but was TTP.  GROIN: moderate cord lipoma on left, small cord lipoma on right; no evidence of reducible inguinal hernias on exam  EXTREMITIES: no cyanosis  NEURO: normal speech    Diagnostic Testing/Labs:  Lab Results   Component Value Date    WBC 7.6 07/11/2017    RBC 5.39 07/11/2017    HGB 16.7 07/11/2017    HCT 51.8 (H) 07/11/2017    MCV 96.1 (H) 07/11/2017    MCHC 32.2 07/11/2017    RDW 13.2 07/11/2017    PLT 221 07/11/2017    MPV 10.5 07/11/2017    SEG 53 07/11/2017    LYMPHS 35 07/11/2017    MONOS 8 07/11/2017     EOS 3 07/11/2017    BASOS 1 07/11/2017     Lab Results   Component Value Date    BUN 14 07/11/2017    CREAT 0.91 07/11/2017    CL 105 07/11/2017    NA 143 07/11/2017    K 4.6 07/11/2017    [place] 9.8 07/11/2017    TBILI 1.20 (H) 04/16/2017    ALB 3.8 04/16/2017    TP 7.3 04/16/2017    AST 21 04/16/2017    ALK 59 04/16/2017    BICARB 29 07/11/2017    ALT 21 04/16/2017    GLU 87 07/11/2017     Lab Results   Component Value Date    NA 143 07/11/2017    K 4.6 07/11/2017    CL 105 07/11/2017    BICARB 29 07/11/2017    BUN 14 07/11/2017    CREAT 0.91 07/11/2017    GLU 87 07/11/2017    [place] 9.8 07/11/2017     No results found for: A1C  No results found for: CHOL, HDL, LDLCALC, TRIG, LDLDIRECT  No results found for: TSH    CT abd/pelvis: right spigelian hernia with inflamed fat. Reported b/l inguinal hernias containing fat    Assessment/Plan:    Pedro Shannon is a 69 year old male, BMI 26.6, with h/o prostate [place] (under surveillance), LUTS s/p REZUM (2018), outlet obstruction requiring suprapubic catheter (now removed), and bladder diverticula s/p robotic bladder diverticulectomy x2 on 04/10/2017 for benign disease with symptoms c/f ventral, possible incisional, hernia vs lipoma.    CT reviewed in detail and he has confirmed right spigelian hernia. Discussed risks/benefits/alternatives to surgery and he wishes to proceed. Plan for robotic assisted lap spigelian hernia repair with mesh. Discussed that will attempt to look in pelvis to see if he has inguinal hernia defects at the time of this surgery.    Orders:   No orders of the defined types were placed in this encounter.

## 2019-09-01 ENCOUNTER — Telehealth (INDEPENDENT_AMBULATORY_CARE_PROVIDER_SITE_OTHER): Payer: Self-pay | Admitting: General Surgery

## 2019-09-01 NOTE — Telephone Encounter (Signed)
Left a VM message calling to schedule surgery.  Claudia

## 2019-09-04 ENCOUNTER — Telehealth (INDEPENDENT_AMBULATORY_CARE_PROVIDER_SITE_OTHER): Payer: Self-pay | Admitting: General Surgery

## 2019-09-04 NOTE — Telephone Encounter (Signed)
Caller: patient  Phone # [phone]  Relationship to patient: self  Notes:     Patient calling back Claudia to schedule surgery    Caller has been advised of 24 hr turnaround time.

## 2019-09-15 ENCOUNTER — Telehealth (INDEPENDENT_AMBULATORY_CARE_PROVIDER_SITE_OTHER): Payer: Self-pay | Admitting: General Surgery

## 2019-09-15 NOTE — Telephone Encounter (Signed)
Left a VM message for patient to return my call to schedule surgery.  Pedro Shannon

## 2019-10-09 ENCOUNTER — Telehealth (INDEPENDENT_AMBULATORY_CARE_PROVIDER_SITE_OTHER): Payer: Self-pay | Admitting: General Surgery

## 2019-12-23 ENCOUNTER — Telehealth (INDEPENDENT_AMBULATORY_CARE_PROVIDER_SITE_OTHER): Payer: Self-pay | Admitting: General Surgery

## 2019-12-23 NOTE — Telephone Encounter (Signed)
E-mailed surgery letter  MRN: [id number]    Mr. Connaughton,         Thank you for choosing [facility] for your healthcare. The following arrangements have been made for your upcoming surgery:      If you use a C-Pap Machine, please bring your machine, mask and tubing with you on the day of surgery.    Please remember not to eat or drink anything after midnight the night before surgery and to have someone with you who can drive you home after your procedure.    Post-op instructions are attached.      Monday, December 28, 2019    9:30 am     Pre-Op Anesthesia Evaluation- Telephone consult     Full pre-operative instructions will be given to you at the time of your Pre-Op Anesthesia appointment.  However, if you should have any further questions or concerns, please feel free to contact us directly at [phone].              Monday, January 11, 2020    TBD  Surgery Check-In    [address] [facility]  [place], [place]  [address]        Robotic assisted laparoscopic right Spigelian hernia repair with mesh, possible open By Dr. Hillery Hunter    **OR time subject to change, I will call you in the afternoon the day before surgery with check in time**             Friday, January 29, 2020    11:10 am   Post-Operative Appointment      My chart video     Our office still requires the use of a mask when inside our office.  If you do not have a mask at the time of your visit, one will be provided at our front desk.  When coming into our office, please limit who accompanies you to only 1 other person.

## 2019-12-28 ENCOUNTER — Encounter (HOSPITAL_BASED_OUTPATIENT_CLINIC_OR_DEPARTMENT_OTHER): Payer: Self-pay | Admitting: Family

## 2019-12-28 ENCOUNTER — Ambulatory Visit: Payer: Medicare Other | Admitting: Family

## 2019-12-28 DIAGNOSIS — Z01818 Encounter for other preprocedural examination: Secondary | ICD-10-CM

## 2019-12-28 MED ORDER — HYDROCHLOROTHIAZIDE 25 MG OR TABS: 25.00 mg | ORAL_TABLET | Freq: Every day | ORAL | Status: AC

## 2019-12-28 NOTE — Patient Instructions (Addendum)
PREOPERATIVE SURGICAL INFORMATION     Your surgery is currently scheduled at [facility] on 01/11/2020  The scheduler will be contacting you with the check in time                                                                         [facility], [place], [place], [place], [place] [address]     Please check in at [employer], [address], 1st floor.     PARKING    For [employer]:   Arbor Tour manager (at the end of [address]) or in [phone] (at [employer] and [place]: [place] [employer] parking is available between 8 a.m. and 4:30 p.m. weekdays at the main entrance to the [employer] on [address]. The cost is the same as self parking. With a handicapped placard, [place] parking is free.   [email or web address]       QUESTIONS    If you have any questions between now and the day of your surgery, please do not hesitate to call:      [facility]: [phone]     DAY OF SURGERY ARRIVAL TIME:    On the day of your Surgery/Procedure, please arrive at the time provided by the surgery/preop team. If you have any questions regarding your arrival time, please call:      Preoperative Surgical Admissions at [facility]: [phone]        MEDICATION INSTRUCTIONS:       MEDICATION INSTRUCTIONS BEFORE SURGERY/PROCEDURE:       OK to take the following medications as scheduled with a small sip of water on the morning of surgery: HCTZ     PLEASE HOLD ALL NSAIDS (non-steroidal anti-inflammatory drugs) SUCH AS advil, aleve, motrin, ibuprofen, relafen, lodine, feldene, Diclofenac, voltaren, indomethacin, naproxen, celebrex, Mobic 7 days before surgery.        Please hold vitamins, supplements, herbs & fish oil 7 days before surgery.      It is OK to take acetaminophen (Tylenol) for pain around the time of surgery unless you have liver disease.       AFTER YOUR VISIT  WITH [place], IF YOU START TAKING A NEW MEDICATION BEFORE SURGERY, PLEASE CALL us TO MAKE SURE IT IS SAFE TO TAKE & WILL NOT AFFECT YOUR SURGERY.         EATING/DRINKING      DO NOT EAT OR DRINK ANYTHING AFTER MIDNIGHT ON THE DAY OF SURGERY    Preparing for your Surgery:      Please wear clean loose-fitting clothes and leave valuables at home    Do not shave or remove body hair. Facial shaving is permitted. If you are having head surgery, ask your doctor whether you can shave.   Bring a picture ID and your insurance card, and be prepared to pay your deductible or co-insurance by cash, check, or credit card when you arrive.    If you are going home after your surgery, please make sure to arrange for an adult to drive you home. You CANNOT use UBER or LYFT. If you do not have a ride, your surgery  may be cancelled.       On The Day of Your Surgery:       Check in at the location mentioned above    If you are a woman of child bearing age, please note that you may be asked to give a urine sample upon check-in   You will meet your anesthesia and surgery teams in the preoperative holding area before surgery.    Once surgery is over, you will wake up in the recovery room.   If you go home, an adult chaperone will need to stay with you for the first 24 hours after surgery.    Visitor policy during the YHNPM-72 pandemic is subject to change. Current visitor policy can be found at [email or web address]      A video about what to expect for the day of surgery can be found here:    [email or web address]  Or by searching You-tube for [place] before surgery and [place] after surgery     You medical records are available to you at [email or web address] click sign up now.

## 2019-12-28 NOTE — Anesthesia Preprocedure Evaluation (Addendum)
ANESTHESIA PRE-OPERATIVE EVALUATION    Patient Information    Name: Pedro Shannon    MRN: [id number]    DOB: April 13, 1950    Age: 69 year old    Sex: male  Procedure(s):  DA VINCI XI Robotic assisted laparoscopic right spigelian hernia repair with mesh, possible open      Pre-op Vitals:   There were no vitals taken for this visit.        Primary language spoken:  English    ROS/Medical History:      History of Present Illness: 69 year old male, BMI 26.6, with a right abdominal bulge    H/O bladder diverticula s/probotic bladder diverticulectomy x2 on 04/10/2017 for benign disease who developed an abdominal bulge shortly after mentioned surgery s/f DA VINCI XI Robotic assisted laparoscopic right spigelian hernia repair with mesh, possible open on 01/11/2020    General:  negative for General ROS  able to climb flight of stairs/Exercise tolaerance >4 mets,  able to dress/bathe self,   Cardiovascular:  hypertension,  States bikes, swims, weights, active-Denies SOB or C/P   Anesthesia History:  negative anesthesia history ROS  no PONV,  Phone Eval      Denies any jaw, airway, or neck motion issues   Pulmonary:   negative pulmonary ROS  no sleep apnea,     Neuro/Psych:   negative neuro/psych ROS  negative for TIA/CVA,  no seizures,   Hematology/Oncology:   history of cancer ( Prostate-surveillance),  no chemotherapy,  no radiation treatment,      GI/Hepatic:  negative GI/hepatic ROS Infectious Disease:  negative for infectious disease  no hepatitis,  no HIV,     Renal:  negative renal ROS   Endocrine/Other:  back pain ( Ongoing-no meds),     Pregnancy History:   Pediatrics:         Pre Anesthesia Testing (PCC/CPC) notes/comments:                 Physical Exam    Airway:    Inter-inciser distance > 4 cm  Prognanth Able    Mallampati: I  Neck ROM: full  TM distance: > 6 cm  Short thick neck: No          Cardiovascular:  - cardiovascular exam normal         Pulmonary:  - pulmonary exam normal            Neuro/Neck/Skeletal/Skin:  - Neck/Neuro/Skeletal/Skin exam normal          Dental:    Comment: Caps on front top teeth- normal exam    Abdominal:   - normal exam         Additional Clinical Notes:               Last  OSA (STOP BANG) Score:  No data recorded    Last OSA  (STOP) Score for   No data recorded                 Past Medical History:   Diagnosis Date    Prostate cancer (CMS-HCC)      Past Surgical History:   Procedure Laterality Date    NASAL SEPTOPLASTY      R inguinal hernia      SUPRAPUBIC TUBE PLACEMENT       Social History     Tobacco Use    Smoking status: Never Smoker    Smokeless tobacco: Never Used   Substance Use Topics  Alcohol use: No    Drug use: No        Alcohol Use:     Frequency of Alcohol Consumption: Not on file    Average Number of Drinks: Not on file    Frequency of Binge Drinking: Not on file       Current Outpatient Medications   Medication Sig Dispense Refill    docusate sodium (COLACE) 100 MG capsule TAKE ONE CAPSULE BY MOUTH TWICE DAILY 60 each 0    sulfamethoxazole-trimethoprim (BACTRIM DS, SEPTRA DS) 800-160 MG tablet Take 1 tablet by mouth 2 times daily. 28 tablet 0    tamsulosin (FLOMAX) 0.4 MG capsule Take 0.4 mg by mouth daily.       No current facility-administered medications for this visit.     Allergies   Allergen Reactions    Dutasteride Hives    Rogaine [Minoxidil] Hives       Labs and Other Data  Lab Results   Component Value Date    NA 143 07/11/2017    K 4.6 07/11/2017    CL 105 07/11/2017    BICARB 29 07/11/2017    BUN 14 07/11/2017    CREAT 0.91 07/11/2017    GLU 87 07/11/2017    [place] 9.8 07/11/2017     Lab Results   Component Value Date    AST 21 04/16/2017    ALT 21 04/16/2017    ALK 59 04/16/2017    TP 7.3 04/16/2017    ALB 3.8 04/16/2017    TBILI 1.20 (H) 04/16/2017    DBILI 0.3 (H) 04/16/2017     Lab Results   Component Value Date    WBC 7.6 07/11/2017    RBC 5.39 07/11/2017    HGB 16.7 07/11/2017    HCT 51.8 (H) 07/11/2017    MCV  96.1 (H) 07/11/2017    MCHC 32.2 07/11/2017    RDW 13.2 07/11/2017    PLT 221 07/11/2017    MPV 10.5 07/11/2017    SEG 53 07/11/2017    LYMPHS 35 07/11/2017    MONOS 8 07/11/2017    EOS 3 07/11/2017    BASOS 1 07/11/2017     Lab Results   Component Value Date    INR 1.1 04/13/2017    PTT 26 04/13/2017       Anesthesia Plan:  Risks and Benefits of Anesthesia  I have personally performed an appropriate pre-anesthesia physical exam of the patient (including heart, lungs, and airway) prior to the anesthetic and reviewed the pertinent medical history, drug and allergy history, laboratory and imaging studies and consultations.   I have determined that the patient has had adequate assessment and testing.  I have validated the documentation of these elements of the patient exam and/or have made necessary changes to reflect my own observations during my pre-anesthesia exam.  Anesthetic techniques, invasive monitors, anesthetic drugs for induction, maintenance and post-operative analgesia, risks and alternatives have been explained to the patient and/or patient's representatives.    I have prescribed the anesthetic plan:         Planned anesthesia method: General         ASA 2 (Mild systemic disease)     Potential anesthesia problems identified and risks including but not limited to the following were discussed with patient and/or patient's representative: Adverse or allergic drug reaction, Ocular injury, Dental injury or sore throat, Nerve injury and Injury to brain, heart and other organs    No Beta Blocker Indicated: Patient  not on beta blockers    Planned monitoring method: Routine monitoring    Informed Consent:  Anesthetic plan and risks discussed with Patient.  Use of blood products discussed with patient who consented to blood products.         Plan discussed with Attending and Resident.

## 2020-01-11 ENCOUNTER — Other Ambulatory Visit: Payer: Self-pay

## 2020-01-11 ENCOUNTER — Ambulatory Visit (HOSPITAL_BASED_OUTPATIENT_CLINIC_OR_DEPARTMENT_OTHER): Payer: Medicare Other | Admitting: Anesthesiology

## 2020-01-11 ENCOUNTER — Ambulatory Visit (HOSPITAL_COMMUNITY): Payer: Medicare Other | Admitting: Family

## 2020-01-11 ENCOUNTER — Encounter (HOSPITAL_COMMUNITY): Admission: RE | Disposition: A | Discharge status: Home Health | Payer: Self-pay | Attending: General Surgery

## 2020-01-11 ENCOUNTER — Ambulatory Visit
Admission: RE | Admit: 2020-01-11 | Discharge: 2020-01-11 | Disposition: A | Discharge status: Home / Self Care | Payer: Medicare Other | Attending: General Surgery | Admitting: General Surgery

## 2020-01-11 DIAGNOSIS — Z801 Family history of malignant neoplasm of trachea, bronchus and lung: Secondary | ICD-10-CM | POA: Insufficient documentation

## 2020-01-11 DIAGNOSIS — K430 Incisional hernia with obstruction, without gangrene: Secondary | ICD-10-CM | POA: Insufficient documentation

## 2020-01-11 DIAGNOSIS — K436 Other and unspecified ventral hernia with obstruction, without gangrene: Secondary | ICD-10-CM

## 2020-01-11 DIAGNOSIS — Z8546 Personal history of malignant neoplasm of prostate: Secondary | ICD-10-CM | POA: Insufficient documentation

## 2020-01-11 DIAGNOSIS — K439 Ventral hernia without obstruction or gangrene: Secondary | ICD-10-CM

## 2020-01-11 DIAGNOSIS — I10 Essential (primary) hypertension: Secondary | ICD-10-CM

## 2020-01-11 SURGERY — DA VINCI XI- REPAIR, VENTRAL HERNIA, ROBOTIC ASSISTED
Anesthesia: General | Site: Abdomen | Laterality: Right | Wound class: Class I (Clean)

## 2020-01-11 MED ORDER — FENTANYL CITRATE (PF) 50 MCG/ML IJ SOLN (WRAPPED RECORD) ~~LOC~~
50.0000 ug | INTRAMUSCULAR | Status: DC | PRN
Start: 2020-01-11 — End: 2020-01-11
  Administered 2020-01-11: 50 ug via INTRAVENOUS
  Filled 2020-01-11: qty 1

## 2020-01-11 MED ORDER — SUGAMMADEX SODIUM 200 MG/2ML IV SOLN
INTRAVENOUS | Status: AC
Start: 2020-01-11 — End: ?
  Filled 2020-01-11: qty 2

## 2020-01-11 MED ORDER — BUPIVACAINE HCL (PF) 0.25 % IJ SOLN
INTRAMUSCULAR | Status: DC | PRN
Start: 2020-01-11 — End: 2020-01-11
  Administered 2020-01-11: 20 mL via INTRAMUSCULAR

## 2020-01-11 MED ORDER — MIDAZOLAM HCL 2 MG/2ML IJ SOLN
INTRAMUSCULAR | Status: DC | PRN
Start: 2020-01-11 — End: 2020-01-11
  Administered 2020-01-11: 2 mg via INTRAVENOUS

## 2020-01-11 MED ORDER — LABETALOL HCL 5 MG/ML IV SOLN
INTRAVENOUS | Status: DC | PRN
Start: 2020-01-11 — End: 2020-01-11
  Administered 2020-01-11 (×3): 5 mg via INTRAVENOUS

## 2020-01-11 MED ORDER — DOCUSATE SODIUM 250 MG OR CAPS
250.0000 mg | ORAL_CAPSULE | Freq: Two times a day (BID) | ORAL | 0 refills | Status: AC
Start: 2020-01-11 — End: ?
  Filled 2020-01-11: qty 60, 30d supply, fill #0

## 2020-01-11 MED ORDER — LIDOCAINE HCL (PF) 1 % IJ SOLN
0.1000 mL | Freq: Once | INTRAMUSCULAR | Status: DC | PRN
Start: 2020-01-11 — End: 2020-01-11

## 2020-01-11 MED ORDER — DEXMEDETOMIDINE HCL 200 MCG/2ML IV SOLN
INTRAVENOUS | Status: DC | PRN
Start: 2020-01-11 — End: 2020-01-11
  Administered 2020-01-11 (×5): 2 ug via INTRAVENOUS

## 2020-01-11 MED ORDER — FENTANYL CITRATE (PF) 250 MCG/5ML IJ SOLN
INTRAMUSCULAR | Status: AC
Start: 2020-01-11 — End: 2020-01-11
  Filled 2020-01-11: qty 5

## 2020-01-11 MED ORDER — ACETAMINOPHEN 500 MG OR TABS
500.0000 mg | ORAL_TABLET | Freq: Four times a day (QID) | ORAL | 0 refills | Status: AC | PRN
Start: 2020-01-11 — End: ?
  Filled 2020-01-11: qty 60, 15d supply, fill #0

## 2020-01-11 MED ORDER — ONDANSETRON HCL 4 MG/2ML IV SOLN
INTRAMUSCULAR | Status: DC | PRN
Start: 2020-01-11 — End: 2020-01-11
  Administered 2020-01-11: 4 mg via INTRAVENOUS

## 2020-01-11 MED ORDER — FENTANYL CITRATE (PF) 250 MCG/5ML IJ SOLN
INTRAMUSCULAR | Status: DC | PRN
Start: 2020-01-11 — End: 2020-01-11
  Administered 2020-01-11: 50 ug via INTRAVENOUS
  Administered 2020-01-11: 25 ug via INTRAVENOUS
  Administered 2020-01-11: 75 ug via INTRAVENOUS
  Administered 2020-01-11: 25 ug via INTRAVENOUS
  Administered 2020-01-11 (×2): 50 ug via INTRAVENOUS
  Administered 2020-01-11: 25 ug via INTRAVENOUS
  Administered 2020-01-11: 50 ug via INTRAVENOUS

## 2020-01-11 MED ORDER — LACTATED RINGERS IV SOLN
INTRAVENOUS | Status: DC
Start: 2020-01-11 — End: 2020-01-11

## 2020-01-11 MED ORDER — LIDOCAINE HCL 2 % IJ SOLN WRAPPED RECORD
INTRAMUSCULAR | Status: DC | PRN
Start: 2020-01-11 — End: 2020-01-11
  Administered 2020-01-11: 40 mg via INTRAVENOUS

## 2020-01-11 MED ORDER — OXYCODONE HCL 5 MG OR TABS
5.0000 mg | ORAL_TABLET | Freq: Once | ORAL | Status: DC | PRN
Start: 2020-01-11 — End: 2020-01-11

## 2020-01-11 MED ORDER — MEPERIDINE HCL 25 MG/ML IJ SOLN
12.5000 mg | INTRAMUSCULAR | Status: DC | PRN
Start: 2020-01-11 — End: 2020-01-11

## 2020-01-11 MED ORDER — METOPROLOL TARTRATE 5 MG/5ML IV SOLN
INTRAVENOUS | Status: DC | PRN
Start: 2020-01-11 — End: 2020-01-11
  Administered 2020-01-11 (×3): 2.5 mg via INTRAVENOUS

## 2020-01-11 MED ORDER — MIDAZOLAM HCL 2 MG/2ML IJ SOLN
INTRAMUSCULAR | Status: AC
Start: 2020-01-11 — End: 2020-01-11
  Filled 2020-01-11: qty 2

## 2020-01-11 MED ORDER — ROCURONIUM BROMIDE 100 MG/10ML IV SOLN
INTRAVENOUS | Status: DC | PRN
Start: 2020-01-11 — End: 2020-01-11
  Administered 2020-01-11: 100 mg via INTRAVENOUS
  Administered 2020-01-11 (×3): 10 mg via INTRAVENOUS

## 2020-01-11 MED ORDER — FENTANYL CITRATE (PF) 50 MCG/ML IJ SOLN (WRAPPED RECORD) ~~LOC~~
25.0000 ug | INTRAMUSCULAR | Status: DC | PRN
Start: 2020-01-11 — End: 2020-01-11

## 2020-01-11 MED ORDER — BUPIVACAINE HCL 0.25 % IJ SOLN
INTRAMUSCULAR | Status: AC
Start: 2020-01-11 — End: ?
  Filled 2020-01-11: qty 50

## 2020-01-11 MED ORDER — ACETAMINOPHEN 10 MG/ML IV SOLN
INTRAVENOUS | Status: AC
Start: 2020-01-11 — End: ?
  Filled 2020-01-11: qty 200

## 2020-01-11 MED ORDER — CEFAZOLIN SODIUM 1 GM IJ SOLR
INTRAMUSCULAR | Status: DC | PRN
Start: 2020-01-11 — End: 2020-01-11
  Administered 2020-01-11: 2000 mg via INTRAVENOUS

## 2020-01-11 MED ORDER — DIPHENHYDRAMINE HCL 50 MG/ML IJ SOLN
12.5000 mg | Freq: Once | INTRAMUSCULAR | Status: DC | PRN
Start: 2020-01-11 — End: 2020-01-11

## 2020-01-11 MED ORDER — IBUPROFEN 600 MG OR TABS
600.0000 mg | ORAL_TABLET | Freq: Four times a day (QID) | ORAL | 0 refills | Status: AC | PRN
Start: 2020-01-11 — End: ?
  Filled 2020-01-11: qty 30, 8d supply, fill #0

## 2020-01-11 MED ORDER — NALOXONE HCL 0.4 MG/ML IJ SOLN
0.1000 mg | INTRAMUSCULAR | Status: DC | PRN
Start: 2020-01-11 — End: 2020-01-11

## 2020-01-11 MED ORDER — SUGAMMADEX SODIUM 500 MG/5ML IV SOLN
INTRAVENOUS | Status: DC | PRN
Start: 2020-01-11 — End: 2020-01-11
  Administered 2020-01-11: 400 mg via INTRAVENOUS

## 2020-01-11 MED ORDER — MULTIVITAMINS OR CAPS: 1.00 | ORAL_CAPSULE | Freq: Every day | ORAL | Status: AC

## 2020-01-11 MED ORDER — DEXAMETHASONE SODIUM PHOSPHATE 4 MG/ML IJ SOLN (CUSTOM)
INTRAMUSCULAR | Status: DC | PRN
Start: 2020-01-11 — End: 2020-01-11
  Administered 2020-01-11 (×2): 6 mg via INTRAVENOUS

## 2020-01-11 MED ORDER — PROPOFOL 200 MG/20ML IV EMUL
INTRAVENOUS | Status: DC | PRN
Start: 2020-01-11 — End: 2020-01-11
  Administered 2020-01-11 (×2): 180 mg via INTRAVENOUS

## 2020-01-11 MED ORDER — HYDROMORPHONE HCL 1 MG/ML IJ SOLN
0.5000 mg | INTRAMUSCULAR | Status: DC | PRN
Start: 2020-01-11 — End: 2020-01-11
  Administered 2020-01-11 (×2): 0.5 mg via INTRAVENOUS
  Filled 2020-01-11 (×2): qty 0.5

## 2020-01-11 MED ORDER — FENTANYL CITRATE (PF) 100 MCG/2ML IJ SOLN
INTRAMUSCULAR | Status: AC
Start: 2020-01-11 — End: ?
  Filled 2020-01-11: qty 2

## 2020-01-11 MED ORDER — ONDANSETRON HCL 4 MG/2ML IV SOLN
4.0000 mg | Freq: Once | INTRAMUSCULAR | Status: DC | PRN
Start: 2020-01-11 — End: 2020-01-11

## 2020-01-11 MED ORDER — LACTATED RINGERS IV SOLN
INTRAVENOUS | Status: DC | PRN
Start: 2020-01-11 — End: 2020-01-11

## 2020-01-11 MED ORDER — ACETAMINOPHEN 10 MG/ML IV SOLN
INTRAVENOUS | Status: DC | PRN
Start: 2020-01-11 — End: 2020-01-11
  Administered 2020-01-11: 1000 mg via INTRAVENOUS

## 2020-01-11 MED ORDER — LABETALOL HCL 5 MG/ML IV SOLN
10.0000 mg | INTRAVENOUS | Status: DC | PRN
Start: 2020-01-11 — End: 2020-01-11

## 2020-01-11 SURGICAL SUPPLY — 70 items
APPLICATOR CHLORAPREP 26ML, ~~LOC~~ (Prep Solutions) ×2 IMPLANT
CLEARIFY SYSTEM VISUALIZATION SCOPE WARMER D-HELP (Lap/Endo/Arthroscopy) ×2 IMPLANT
CLIPPER BLADE ASSY FOR 9670 CLIPPER (Patient Care Supply) ×2 IMPLANT
COVER LIGHT HANDLE RIGID (2 PACK) (Misc Surgical Supply) ×2 IMPLANT
COVER MAYO STAND 23 X 54" (Drapes/towels) ×8 IMPLANT
COVER TIP ACCESSORY DA VINCI S/SI/XI 8MM DISPOSABLE (Lap/Endo/Arthroscopy) ×2 IMPLANT
DA VINCI XI ARM DRAPE (Drapes/towels) ×8 IMPLANT
DA VINCI XI COLUMN DRAPE (Drapes/towels) ×2 IMPLANT
DA VINCI XI FORCE BIPOLAR 8MM (Robotic instruments) ×2 IMPLANT
DA VINCI XI MEGA NEEDLE DRIVER (Robotic instruments) ×1
DERMABOND ADVANCE 0.7ML (Suture) ×2 IMPLANT
DEVICE CLOSURE ENDO CLOSE AUTO SUTURE TROCAR (Lap/Endo/Arthroscopy) ×2 IMPLANT
GLOVE BIOGEL INDICATOR UNDERGLOVE SIZE 6.5 (Gloves/Gowns) ×4 IMPLANT
GLOVE BIOGEL PI INDICATOR SIZE 7 (Gloves/Gowns) ×6 IMPLANT
GLOVE BIOGEL PI INDICATOR SIZE 8 (Gloves/Gowns) ×2 IMPLANT
GLOVE BIOGEL PI ULTRATOUCH SIZE 7 (Gloves/Gowns) ×8 IMPLANT
GLOVE BIOGEL PI ULTRATOUCH SIZE 8 (Gloves/Gowns) ×2 IMPLANT
GLOVE SURGEON BIOGEL SIZE 7 (Gloves/Gowns) ×6 IMPLANT
GLOVE SURGICAL BIOGEL SIZE 6.5 (Gloves/Gowns) ×6 IMPLANT
GOWN MICRO COOL LG BLUE, AAMI LVL 4 (Gloves/Gowns) ×4 IMPLANT
GOWN SURGICAL ULTRA XL BLUE, AAMI LVL 3 (Gloves/Gowns) IMPLANT
JMC ONLY- O.R. CAMERA COVER LIGHT, STERILE (Misc Surgical Supply) IMPLANT
KIT POSITIONER PATIENT PINKPAD (Misc Surgical Supply) ×2
MARKER SECURELINE SURG SKIN (Misc Medical Supply) ×2 IMPLANT
MESH PROGRIP LAPROSCOPIC 12 X 16 (Patch/Mesh) ×2 IMPLANT
NEEDLE DRIVER DA VINCI XI MEGA (Robotic instruments) ×1 IMPLANT
OBTURATOR DA VINCI XI BLADELESS OPTICAL 8MM- DISPOSABLE (Lap/Endo/Arthroscopy) ×2 IMPLANT
PAD GROUND VALLEYLAB REM ADULT E7507 (Cautery) ×2 IMPLANT
PINK PAD TRENDELENBURG POSITIONING KIT (Misc Surgical Supply) ×1 IMPLANT
PROTECTOR ULNAR NERVE PAD, YELLOW (Patient Care Supply) ×4 IMPLANT
SCISSORS DA VINCI XI HOT SHEARS MONOPOLAR CURVED 8MM (Robotic instruments) ×2 IMPLANT
SEAL DA VINCI XI UNIVERSAL 5-8MM (Lap/Endo/Arthroscopy) ×6 IMPLANT
SET TUBING PNEUMOCLEAR SMOKE EVACUATION HIGH FLOW (Tubing/Suction) ×2 IMPLANT
SLEEVE SCD KNEE MEDIUM (Patient Care Supply) ×2 IMPLANT
SOLUTION IRR POUR BTL 0.9% NS 1000ML (Non-Pharmacy Meds/Solutions) ×2 IMPLANT
SOLUTION IRR POUR BTL H20 1000ML (Non-Pharmacy Meds/Solutions) ×2 IMPLANT
SOLUTION IV 0.9% NS 1000ML (Non-Pharmacy Meds/Solutions) IMPLANT
SUCTION IRRIGATOR STRYKEFLOW2- SINGLE USE (Lap/Endo/Arthroscopy) IMPLANT
SURGICAL PACK ROBOTIC SURGERY (Procedure Packs/kits) ×2 IMPLANT
SUTURE MONOCRYL PLUS 4-0 27" PS-2 MCP426 (Suture) ×4 IMPLANT
SUTURE PDS PLUS 0 27" CT-1 VIOLET (Suture) ×2 IMPLANT
SUTURE PDS PLUS 0 27" CT-2 (PDP334) (Suture) ×4 IMPLANT
SUTURE PERMA-HAND SILK 0 30" KS (Suture) IMPLANT
SUTURE PROLENE 0 30" CT-1 (Suture)
SUTURE PROLENE 0 30" CT-1 8424 (Suture) IMPLANT
SUTURE PROLENE 2-0 30" CT-2 (Suture) IMPLANT
SUTURE STRATAFIX 0 6" CT-1 SPIRAL PDS PLUS (Suture) ×2 IMPLANT
SUTURE STRATAFIX 2-0 12" SH-1 SPIRAL POLYPROPYLENE SXPL1B400 (Suture) IMPLANT
SUTURE STRATAFIX 2-0 15CM SH SPIRAL PDS PLUS (Suture) IMPLANT
SUTURE STRATAFIX 2-0 15CM SPIRAL PDS PLUS VIOLET CT-2 SXPP1B413 (Suture) IMPLANT
SUTURE STRATAFIX 3-0 15CM SH VIOLET SPIRAL PDS PLUS (Suture) ×2
SUTURE STRATAFIX 3-0 15CM SH VIOLET SPIRAL PDS PLUS BX/12 EACH (Suture) ×2
SUTURE STRATAFIX 3-0 6" RB-1 SPIRAL PDS PLUS (Suture) ×2 IMPLANT
SUTURE STRATAFIX 3-0 6" SH SPIRAL PDS+ SXPP1B420 (Suture) ×2 IMPLANT
SUTURE STRATAFIX 3-0 9" SH-1 SPIRAL PDS+ SXPP1B453 (Suture) ×2 IMPLANT
SUTURE STRATAFIX PDS PLUS 1 CT-1 45CM, 1/2 CIRCLE (Suture) IMPLANT
SUTURE STRATAFIX PDS PLUS 1 CT-1 45CM, ½ CIRCLE (Suture)
SUTURE STRATAFIX SPIRAL 0 9' 22CM PDS CT-1 BX/12 (Suture) IMPLANT
SUTURE STRATAFIX SPIRAL 3-0 9" SH-1 PDS PLUS BX/12 (Suture) ×4
SUTURE STRATAFIX SPIRAL PDS 0 12" CT-1 SXPP1B450 (Suture) ×2 IMPLANT
SUTURE STRATAFIX SPIRAL PPN 2-0 SH-1 30CM UNDYED (Suture)
SUTURE V-LOC 180 3-0 6" V-20 L0604 (Suture) IMPLANT
SUTURE V-LOC 90 3-0 6" CV-23 0804 (Suture) IMPLANT
SUTURE V-LOC PBT 0 6" GS-21 N0306 (Suture) IMPLANT
SUTURE V-LOC PBT 2-0 6" V-20 N0605 (Suture) IMPLANT
SUTURE VICRLY PLUS 0 54"" (Suture) IMPLANT
SUTURE VICRYL PLUS 2-0 27" SH VCP417 (Suture) IMPLANT
TOWELS OR BLUE 4-PACK STERILE, DISPOSABLE (Drapes/towels) ×2 IMPLANT
TRAY FOLEY SURESTEP LUBRI-SIL I.C.16FR URIMETER, LF (Lines/Drains) IMPLANT
TROCAR ENDOPATH XCEL BLADELESS OPTIVIEW STABILITY SLEEVE 5MM X L100 MM (Lap/Endo/Arthroscopy) ×2 IMPLANT

## 2020-01-11 NOTE — Brief Op Note (Signed)
BRIEF OPERATIVE NOTE     DATE: 01/11/2020  TIME: 10:02 AM    CASE ID: [id number]    PREOPERATIVE DIAGNOSIS: Spigelian hernia [K43.9]    POSTOPERATIVE DIAGNOSIS: same    PROCEDURE:   Procedure(s):  DA VINCI XI Robotic assisted laparoscopic right spigelian hernia repair with mesh, possible open    SURGEONS:  Surgeon(s) and Role:     * [name], Ethelle Lyon, MD - Primary     * Nguyen-Ta, Sharyon Medicus, MD - Resident - Assisting    NURSING:  Circulator: Domagas, Cathleen Fears, RN  Scrub: Royden Purl; Florence Canner    ASSISTANT(S):  None    ANESTHESIA:   * No anesthesia type entered *  Anesthesiologist: De Nurse, MD  Anesthesia Resident: Jules Husbands, MD     FINDINGS: Incarcerated Spigelian hernia in RLQ. Progrip 16 x 12 mm utilized for the mesh. Hemostasis confirmed.    WOUND CLASSIFICATION:  Procedure(s):  DA VINCI XI Robotic assisted laparoscopic right spigelian hernia repair with mesh - Wound Class: Class I (Clean) - Incision Closure: Deep and Superficial Layers    WOUND CLOSURE STATUS:  Procedure(s):  DA VINCI XI Robotic assisted laparoscopic right spigelian hernia repair with mesh - Wound Class: Class I (Clean) - Incision Closure: Deep and Superficial Layers    SPECIMENS:  * No specimens in log *    IMPLANTS:     Implant Name Type Inv. Item Serial No. Manufacturer Lot No. LRB No. Used Action   MESH PROGRIP LAPROSCOPIC 12 X 16 - SN/A Patch/Mesh MESH PROGRIP LAPROSCOPIC 12 X 16 N/A COVIDIEN [id number] 1 Implanted       Fluids/Blood Products:      IV Fluids: 1 L LR    Blood Products: none    EBL: <5 mL    Urine Output: not measured    COMPLICATIONS: none immediate    DISPOSITION: extubated, stable to PACU, then discharge to home

## 2020-01-11 NOTE — Op Note (Addendum)
01/11/2020    PREOPERATIVE DIAGNOSIS:  Right spigelian/flank Hernia  POSTOPERATIVE DIAGNOSIS:  Right incarcerated spigelian/flank Hernia  PROCEDURE:  Robotic assisted right spigelian hernia repair with mesh  SURGEON/STAFF:  R. Creed Kail  FELLOW/RESIDENT: K. Nguyen-Ta    MESH USED: 15x12cm pariatex progrip mesh     TACKS USED:  None  ANESTHESIA:  General.   BLOOD LOSS:  Minimal.  COUNTS:  Needle and sponge count correct.  SPECIMENS:  None.      INDICATIONS FOR OPERATION:  Pedro Shannon is a 69 year old male who presented to my clinic for evaluation of a right spigelian/flank hernia.   The risks and benefits of open, conservative and robotic assisted vs  laparoscopic management were discussed at length and in detail with the patient.  He has chosen to undergo robotic assisted Personnel officer.     OPERATION:  The patient was brought to the operating room in stable condition.   Preoperative antibiotics were given and sequential compression devices were applied.  At this time, the patient was laid supine on the operating room table.  General anesthesia was induced by the Anesthesia service without difficulty.  The patient's abdomen was prepped and draped in the usual sterile fashion.      At this time, the patient's abdomen was accessed at the level of the left subcostal region with an optical view trocar. The abdomen was insufflated.  Brief survey of the abdominal cavity revealed no intra-abdominal injury, no herniation other than the 2cm spigelian/flank hernia.   The operation was continued by insertion of 3 additional 8 mm trocars, one suprapubic, one through umbilicus, and left mid-clavicular line. He was noted to have a large quantity of incarcerated omental fat requiring lysis of adhesions and careful traction to completely reduce the hernia.   A peritoneal flap was then raised and the hernia sac plus preperitoneal fat reduced. The defect was then closed with 0 PDS stratifix suture with great care to  take only fascia that could be adequately fisualized and avoidance of trapping of any nerves.      Next, the mesh was inserted into the abdomen. The self affixing side was placed towards the muscle.  The mesh was then reperitonealized by bringing the peritoneum up and over the mesh and ensuring that the mesh lay in good position. There was evidence of prior laparoscpic right inguinal hernia repair with mesh in place; 1cm overlap was achieve between the meshes. The peritoneum was sutured together with multiple 3-0 pds stratifix suture. There was some tension on the very friable and thin peritoneum so there were unavoidable small sub-cm openings in the peritoneum near the lateral border of mesh. The field was completely clean with no evidence of intra-abdominal injury or bleeding.  All trocars were then removed under direct vision.   All skin incisions were closed with 4-0 Monocryl and Indermil glue.      The patient was extubated in the recovery room in stable condition.  I, the attending surgeon, performed the entire operation.

## 2020-01-11 NOTE — Discharge Instructions (Signed)
Hernia Surgery Postoperative Instructions      Hernia Repair: After Your Surgery      A hernia occurs when a weak spot in your belly muscles allows a piece of your intestines or the tissue around them to poke through. This can cause pain, especially when you lift heavy loads. The hernia may occur in your groin, near your belly button, or in a scar from an earlier surgery. Open surgery to repair a hernia will leave a longer scar on your belly than laparoscopic surgery, which leaves several smaller scars. The scars will fade with time.    You are likely to have pain for the next few days after surgery to repair your hernia. You may also feel like you have the flu, and you may have a low fever and feel tired and nauseated. This is common. If you have had laparoscopic surgery, you may feel discomfort in your shoulder area for a few days. After either type of surgery, you will probably feel much better in 7 days. For several weeks you may feel twinges or pulling in the hernia repair.    Men may want to wear a jockstrap or briefs, not boxers, for scrotal support for several days after a groin (inguinal) hernia repair.    This care sheet gives you a general idea about how long it will take for you to recover. But each person recovers at a different pace. Follow the steps below to get better as quickly as possible.    You can expect to feel better and stronger each day. You may get tired easily or have less energy than usual. Rest as you need to, but staying comfortably active will help you heal.    How can you care for yourself at home?    Activity  - Arrange for extra help at home after surgery, especially if you live alone or provide care for another person.  - For your safety, you must not drive until you are no longer taking pain medicines and you can move and react easily.  - Avoid heavy lifting or straining right after surgery.  - Be as active as you comfortably can. Do a little more each day. Moving boosts  blood flow and helps prevent pneumonia, blood clots, and constipation. Simply walking is excellent activity.  - Avoid strenuous activities until your doctor or other health care professional says it is okay. Examples of these include bicycle riding, jogging, weight lifting, or aerobic exercise.  - Most people are able to return to work within 1 to 2 weeks after surgery.  - You will probably need to take 1 to 2 weeks off from work. This depends on the type of work you do and how you feel.  - Ask your doctor when it is okay for you to have sex.      Diet  - You can return to your normal diet when you feel well. If your stomach is upset, try bland, low-fat foods like plain rice, broiled chicken, toast, and yogurt.  - Avoid alcohol while you are taking prescription pain medicine.  - Many people are constipated after surgery. This can be due to the pain medicine and a lack of activity. Be sure you get plenty of fluids, and take a fiber supplement such as methylcellulose (Citrucel) or psyllium (Metamucil) or a stool softener like docusate (Colace).      Medicine  - Take pain medicine as needed, following the directions carefully. Do not wait until you   are in severe pain. You will get better results if you take it sooner.  - Talk to your doctor or other health care professional before starting any new medicine, including an over-the-counter medicine.  - Do NOT take more than one pain medicine that contains acetaminophen (Tylenol) at the same time. Many over-the-counter medicines, as well as the commonly prescribed pain medicines hydrocodone with acetaminophen (Vicodin, Norco) and oxycodone and acetaminophen (Percocet), contain Tylenol. Too much Tylenol is dangerous. Check the labels carefully.  - Taking narcotics (hydrocodone, oxycodone, morphine, dilaudid, etc...) for more than 48 to 72 hrs WILL result in a 'withdrawal' syndrome, manifested by feeling just plain awful, like a bad flu, with everything (including the  surgical site) hurting. The treatment is NOT taking more narcotic medication, but to try to control the symptoms with tylenol or NSAID. These symptoms usually resolves in 48 hrs or less, although w high doses of narcotics or long durations of treatment this could take much longer.   - You may not drive if taking narcotic pain medications  - If using narcotics you should also take a stool softener (such as colace) and/or a laxative (such as senna) to prevent constipation. These are available over the counter or may be prescribed to you by your doctor  - To avoid an upset stomach, take your pain pills with food.  - After surgery, your doctor will tell you when you can take your regular medicines again. Ask your doctor or other health care professional if you do not know when to restart your regular medicines after surgery.  - If you are given antibiotics, be sure to finish them all. Do not stop taking them just because you feel better.      Incision care  - You may remove the outer bandage, if one is present, at 24 hours after surgery.  - It is okay to leave the incision uncovered after the initial bandage is removed, as long as there is no oozing from the incision.    - If you have surgical glue sealing the incision, you may notice this directly covering the incision - leave this in place until it peels off, usually within 1-2 weeks.  - You may shower when the outer bandage is off, after the first 24 hours. Do not scrub your incision. You may clean it with plain warm water, and gently pat it dry. Cover it with a dry gauze bandage if it weeps or rubs against clothing. Keep the area clean and dry.  - Do not soak the incision under water during the first 2 weeks.  - You may have some swelling around your surgery site. This is normal and may take several weeks to go away.      Other instructions  - Ice can help prevent swelling and soreness after surgery and may greatly reduce your need for pain pills. Put ice or a cold  pack on the area of your hernia repair for 10 to 15 minutes at a time. Try to do this every 1 to 2 hours for the first 24 hours (when you are awake) or until the swelling goes down. Put a thin cloth between the ice and your skin.    -Some mild burning-type pain with urination may be experienced the first few times you urinate after surgery.  This is normal as a catheter may have been placed into your bladder after you were asleep.  This should improve slowly after the first 24-48 hours.    -  After laparoscopic inguinal hernia repair, it is very common to experience swelling as well as black-and-blue discoloration of the scrotal skin, testicles, and even the shaft of the penis.  Women may experience the same process in the labia.  THIS IS NORMAL.  It will slowly resolve over a period of 1 or 2 weeks after surgery.  It occurs as the small amount of oozing blood settles in the most dependant area of the body, which, in men, is in the scrotum.  Ice and elevation of the scrotum will help to minimize this immediately after surgery.      Follow-up care is a key part of your treatment and safety. As a partner in your health care, you can do things like keep all scheduled visits, be sure you know the results of all tests and labs ordered as part of your care, and keep an up-to-date personal list of the medicines you are taking. Know how to contact us between visits, and call your doctor or other health care professional if you have signs that you are having problems.    When should you call 911?    If you think you are experiencing a medical emergency, call 911 immediately or seek other emergency services. Examples of symptoms that may be an emergency include:    - You pass out (lose consciousness).  - You have chest pain.  - You have severe pain in your belly.    When should you call your doctor or other health care professional?    - You are sick to your stomach or cannot keep fluids down.  - You have pain that does not get  better after you take pain pills.  - You have a fever over 101 degrees.  - You have signs of infection, such as increasing tenderness, red streaks, or pus from your incision.  - Your swelling is getting worse, and you have bright red bleeding from your incision.  - You have signs of a blood clot, such as unexplained pain or swelling in your leg.  - You have trouble passing urine or stool.  - You are not feeling better day by day.  - You have any problems with your medicine.      If you have any questions or concerns, please call your doctor or the Surgical Clinic where you are receiving your care. After clinic hours, call the Page Operator and ask for the Surgical Resident on call.       [facility] Page Operator [phone]   Ask for the General Surgeon on call

## 2020-01-11 NOTE — Anesthesia Postprocedure Evaluation (Signed)
Anesthesia Post Note    Patient: Pedro Shannon    Procedure(s) Performed: Procedure(s):  DA [place] XI Robotic assisted laparoscopic right spigelian hernia repair with mesh      Final anesthesia type: No value filed.    Patient location: PACU    Post anesthesia pain: adequate analgesia    Mental status: awake, alert  and oriented    Airway Patent: Yes    Last Vitals:   Vitals Value Taken Time   BP 144/87 01/11/20 1115   Temp 36.6 C 01/11/20 1115   Pulse 77 01/11/20 1115   Resp 15 01/11/20 1115   SpO2 96 % 01/11/20 1115        Post vital signs: stable    Hydration: adequate    N/V:no    Anesthetic complications: no    Plan of care per primary team.

## 2020-01-11 NOTE — Plan of Care (Signed)
Problem: Promotion of Perioperative Health and Safety  Goal: Promotion of Health and Safety of the Perioperative Patient  Description: The patient remains safe, receives treatment appropriate to the surgical intervention and patient's physiological needs and is discharged or transferred to the appropriate level of care.    Information below is the current care plan.  Outcome: Resolved  Flowsheets (Taken 01/11/2020 1048)  Patient /Family stated Goal: minimal pain  Guidelines: PACU  Individualized Interventions/Recommendations #1: monitor pain and offer meds as needed  Individualized Interventions/Recommendations #2 (if applicable): monitor vitals  Individualized Interventions/Recommendations #3 (if applicable): review discharge instructions  Outcome Evaluation (rationale for progressing/not progessing) every shift: pt meets pacu discharge criteria.

## 2020-01-11 NOTE — H&P (Signed)
Pre-Operative Surgery History and Physical      Patient Name: Pedro Shannon  MRN: [id number]  Room#: [facility] OR/Main OR      ID: Kell Ferris is a 69 year old presenting for robotic assisted laparoscopic right spigelian hernia repair with mesh, possible open     Patient reports feeling well today. Denies any fevers, chills, nausea, vomiting, chest pain, shortness of breath, difficulty breathing, sore throat, runny nose, loss of taste or smell. Denies any change to their medical history, allergies, or medications. Denies any recent illness or hospitalization. Their last reported PO intake was 8 pm last night. They did not take any medications this morning or last night.    Social: denies any smoking or recreational drug use. He drinks alcohol occasionally.    From last clinic note:  Pedro Shannon is a 69 year old male, BMI 26.6, who is here for evaluation of right abdominal bulge    H/O bladder diverticula s/probotic bladder diverticulectomy x2 on 04/10/2017 for benign disease who developed an abdominal bulge shortly after mentioned surgery. Bulge has increased in size over this time. He had a severe painful episode a few weeks ago that was debilitating. Bulge is typically reducible. Denies n/v, obstructive symptoms.     Returns today to review CT scan results and sign consents for surgery.    PMH includes: prostate [place] (under surveillance), Bladder diverticula s/pbladder diverticulectomy x2 (04/10/2017 for benign disease), LUTS s/p REZUM (2018), outlet obstruction requiring suprapubic catheter (now removed)    Previous Abdominal Surgeries Include:   Robotic bladder diverticula  SP catheter, removed  REZUM  Lap RIHR x 2    Patient Active Problem List    Diagnosis Date Noted    Spigelian hernia 08/28/2019     Added automatically from request for surgery [id number]      Ileus (CMS-HCC) 04/16/2017    Abdominal pain, generalized 04/13/2017    Bladder diverticulum 10/18/2016     Added  automatically from request for surgery [phone]      Benign prostatic hyperplasia, unspecified whether lower urinary tract symptoms present 05/30/2016     Added automatically from request for surgery [id number]      Prostate cancer (CMS-HCC) 07/26/2015       Past Medical History:   Diagnosis Date    Prostate cancer (CMS-HCC)        Past Surgical History:   Procedure Laterality Date    NASAL SEPTOPLASTY      R inguinal hernia      SUPRAPUBIC TUBE PLACEMENT           No current facility-administered medications for this encounter.         Allergies   Allergen Reactions    Dutasteride Hives    Rogaine [Minoxidil] Hives         Family History   Problem Relation Name Age of Onset    Lung Cancer Mother      Melanoma Cancer Mother      Other Father          esophageal cancer       Social History     Socioeconomic History    Marital status: Married     Spouse name: Not on file    Number of children: Not on file    Years of education: Not on file    Highest education level: Not on file   Occupational History    Occupation: Chief Executive Officer   Tobacco Use  Smoking status: Never Smoker    Smokeless tobacco: Never Used   Substance and Sexual Activity    Alcohol use: No    Drug use: No    Sexual activity: Not on file   Other Topics Concern    Not on file   Social History Narrative    Not on file     Social Determinants of Health     Financial Resource Strain:     Difficulty of Paying Living Expenses: Not on file   Food Insecurity:     Worried About [place] in the Last Year: Not on file    Ran Out of Food in the Last Year: Not on file   Transportation Needs:     Lack of Transportation (Medical): Not on file    Lack of Transportation (Non-Medical): Not on file   Physical Activity:     Days of Exercise per Week: Not on file    Minutes of Exercise per Session: Not on file   Stress:     Feeling of Stress : Not on file   Social Connections:     Frequency of Communication with Friends and Family: Not on  file    Frequency of Social Gatherings with Friends and Family: Not on file    Attends Religious Services: Not on file    Active Member of Clubs or Organizations: Not on file    Attends Archivist Meetings: Not on file    Marital Status: Not on file   Intimate Partner Violence:     Fear of Current or Ex-Partner: Not on file    Emotionally Abused: Not on file    Physically Abused: Not on file    Sexually Abused: Not on file   Housing Stability:     Unable to Pay for Housing in the Last Year: Not on file    Number of [place] in the Last Year: Not on file    Unstable Housing in the Last Year: Not on file       Review Of Systems  As noted in HPI/PMH      Physical Exam:   Vitals: There were no vitals taken for this visit.      General Description: Alert and oriented. Affect normal.   Chest and Lungs: No deformity.   Heart: Regular rate and rhythm.   Abdomen: Soft, non-distended, non-tender.  Integument: No active lesion.  Neurologic: No focal deficit  Musculoskeletal: Moves all extremities without apparent deficit  Peripheral Vascular: extremities warm, well perfused    Recent Labs:  Lab Results   Component Value Date    NA 143 07/11/2017    K 4.6 07/11/2017    CL 105 07/11/2017    BICARB 29 07/11/2017    BUN 14 07/11/2017    CREAT 0.91 07/11/2017    GLU 87 07/11/2017    [place] 9.8 07/11/2017    AST 21 04/16/2017    ALT 21 04/16/2017    ALK 59 04/16/2017    TBILI 1.20 (H) 04/16/2017    ALB 3.8 04/16/2017    TP 7.3 04/16/2017       Lab Results   Component Value Date    WBC 7.6 07/11/2017    RBC 5.39 07/11/2017    HGB 16.7 07/11/2017    HCT 51.8 (H) 07/11/2017    MCV 96.1 (H) 07/11/2017    MCHC 32.2 07/11/2017    RDW 13.2 07/11/2017    PLT 221 07/11/2017  MPV 10.5 07/11/2017    LYMPHS 35 07/11/2017    MONOS 8 07/11/2017    EOS 3 07/11/2017    BASOS 1 07/11/2017       Lab Results   Component Value Date    PT 11.6 04/13/2017    INR 1.1 04/13/2017    PTT 26 04/13/2017       Assessment  Preoperative  history and physical completed for Consented Surgical Procedure.    PLAN  -Consent completed  -Proceed to OR for scheduled surgery: Robotic assisted laparoscopic right spigelian hernia repair with mesh, possible open with Dr. Hillery Hunter.    Code Status: Full Code/Full Care    ---------------------  Baltazar Apo, MD, MS, MPH  General Surgery, PGY-2  Service pager: [phone]

## 2020-01-12 ENCOUNTER — Other Ambulatory Visit: Payer: Self-pay

## 2020-01-13 ENCOUNTER — Other Ambulatory Visit: Payer: Self-pay

## 2020-01-26 ENCOUNTER — Ambulatory Visit (HOSPITAL_BASED_OUTPATIENT_CLINIC_OR_DEPARTMENT_OTHER): Payer: Medicare Other | Admitting: General Surgery

## 2020-01-29 ENCOUNTER — Telehealth (INDEPENDENT_AMBULATORY_CARE_PROVIDER_SITE_OTHER): Payer: Medicare Other | Admitting: Nurse Practitioner

## 2020-01-29 ENCOUNTER — Telehealth (INDEPENDENT_AMBULATORY_CARE_PROVIDER_SITE_OTHER): Payer: Self-pay | Admitting: Nurse Practitioner

## 2020-01-29 NOTE — Telephone Encounter (Signed)
Pt was told to upload a picture via mychart then NP will call him to discuss. Pt stated he had already deleted the app and this is causing more issues. He said he is fine and will like to just cancel the appointment. If he has any issue later on he will call to address.     CC agent offered to schedule in person appointment and he stated "He has not time for that' and disconnected the phone.

## 2020-01-29 NOTE — Progress Notes (Signed)
Pt had scheduled Post Op MCVV today at 1040. Pt cancelled. Please see Call Center documentation from today.    #1)  Caller: Alfredo  Relationship to patient:self  Phone # [phone]  Preferred Method of Communication: call back  Notes:     Pt is calling to say he will not allow mychart access his video or microphone and will not use it. He says that it will hack his phone and such and will not use the app. He says he prefers to do a phone call visit.     Please assist.     Pt is currently scheduled today at 10:40am    Caller has been advised of 24 hr turnaround time.      Electronically signed by Alda Berthold at 01/29/2020 10:16 AM      #2)   Pt was told to upload a picture via mychart then NP will call him to discuss. Pt stated he had already deleted the app and this is causing more issues. He said he is fine and will like to just cancel the appointment. If he has any issue later on he will call to address.     CC agent offered to schedule in person appointment and he stated "He has not time for that' and disconnected the phone.      Electronically signed by Alda Berthold at 01/29/2020 10:22 AM      Gwen Pounds, MSN, NP-C  [facility]  Division of Minimally Invasive Surgery  [facility]  [address], [address]; [place], [place] [address]  Dept: [phone]  Fax: [phone]

## 2020-01-29 NOTE — Telephone Encounter (Signed)
Text message was send to NP [facility] with bellow message   NP respond Pt can send Pictures of he's incisions via myChart because she needs to see them  And she will call pt at 10:40   Other option is to come-in to clinic.

## 2020-01-29 NOTE — Telephone Encounter (Signed)
Caller: Pedro Shannon  Relationship to patient:self  Phone # [phone]  Preferred Method of Communication: call back  Notes:     Pt is calling to say he will not allow mychart access his video or microphone and will not use it. He says that it will hack his phone and such and will not use the app. He says he prefers to do a phone call visit.     Please assist.     Pt is currently scheduled today at 10:40am    Caller has been advised of 24 hr turnaround time.

## 2020-03-18 NOTE — Telephone Encounter (Signed)
error 

## 2021-03-06 IMAGING — MR MRI HIP LT WO CONTRAST
5 series · 40 of 40 positions shown · non-contrast
Comparison: None available.

INDICATION: Left hip pain
TECHNIQUE: Multiplanar, multisequence imaging of the left hip was performed without contrast.

[Series 3: t1_axial · axial · 4.0mm · 0.69mm/px · z∈[-93,+77]mm · 11 of 35 slices shown]
[im 1/35]
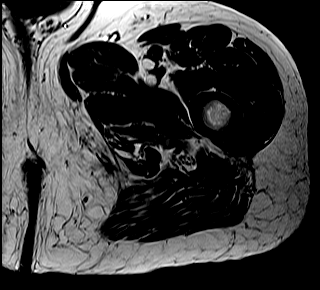
[im 4/35]
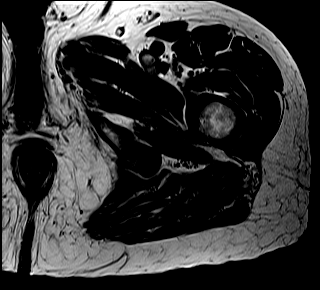
[im 7/35]
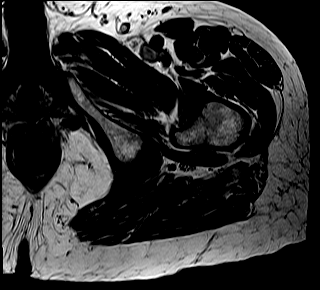
[im 11/35]
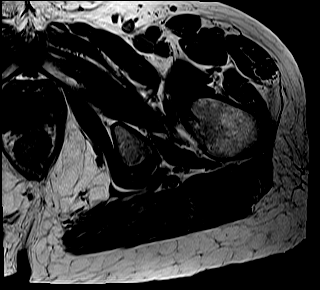
[im 14/35]
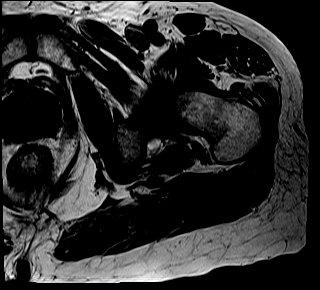
[im 18/35]
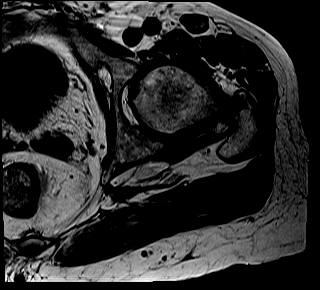
[im 21/35]
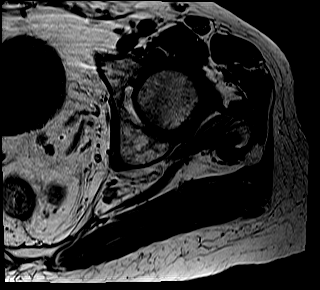
[im 24/35]
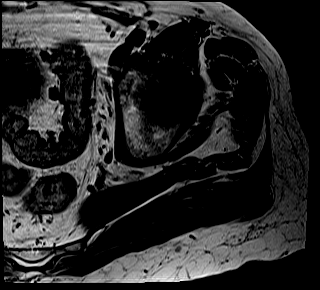
[im 28/35]
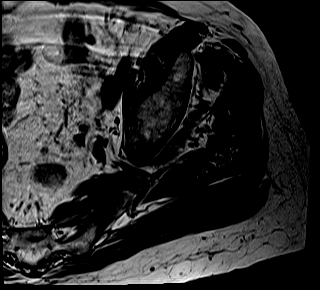
[im 31/35]
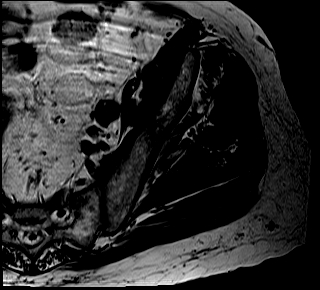
[im 35/35]
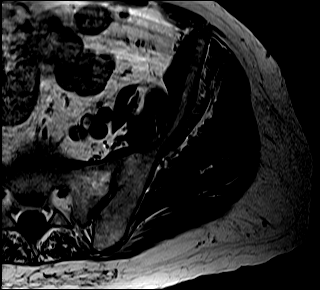

[Series 4: t2_axial_fs · axial · 4.0mm · 0.86mm/px · z∈[-93,+77]mm · 10 of 35 slices shown]
[im 1/35]
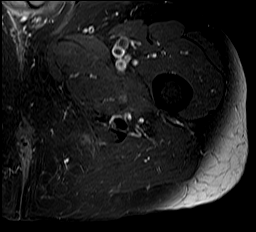
[im 4/35]
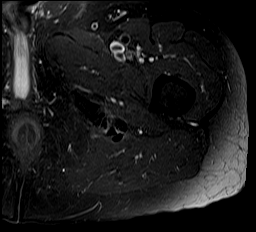
[im 8/35]
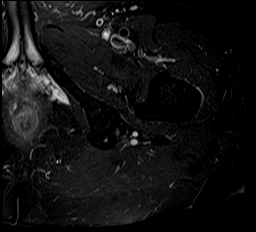
[im 12/35]
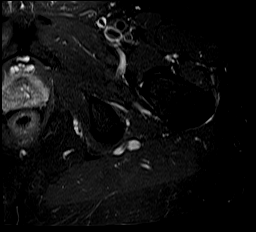
[im 16/35]
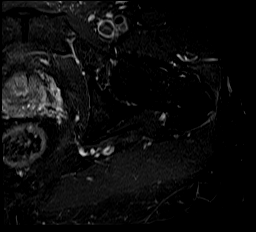
[im 19/35]
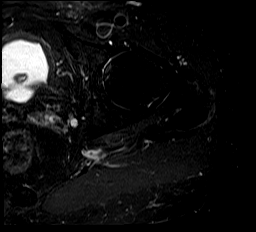
[im 23/35]
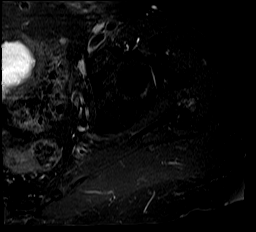
[im 27/35]
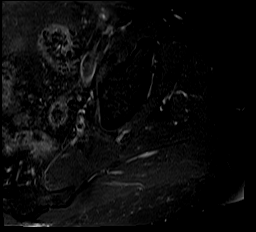
[im 31/35]
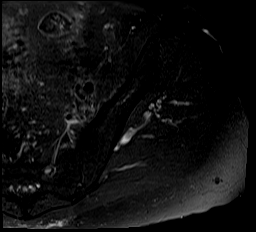
[im 35/35]
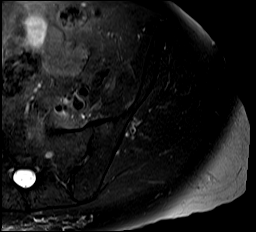

[Series 5: t1_cor · coronal · 4.0mm · 0.75mm/px · 7 of 24 slices shown]
[im 1/24]
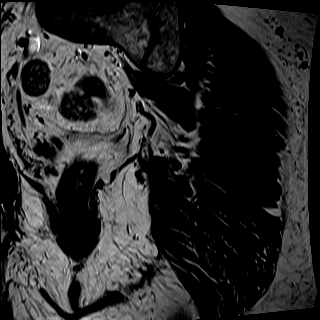
[im 4/24]
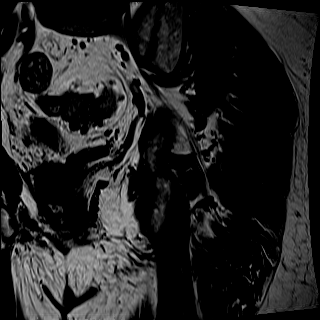
[im 8/24]
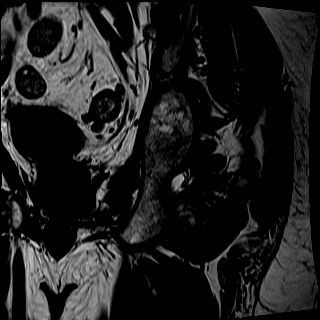
[im 12/24]
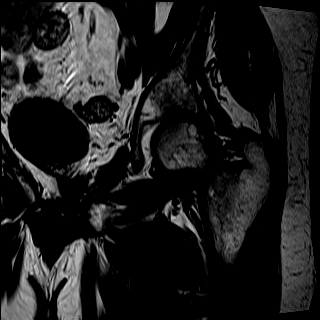
[im 16/24]
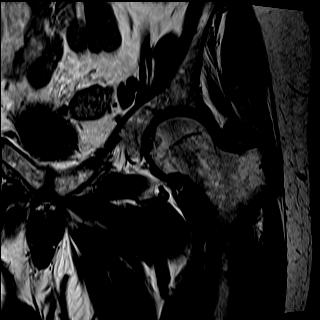
[im 20/24]
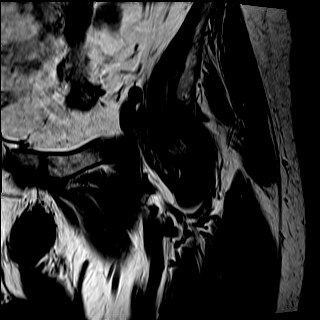
[im 24/24]
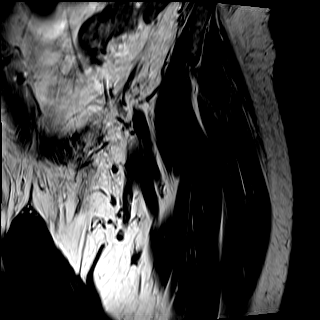

[Series 6: t2_cor_fs · coronal · 4.0mm · 0.75mm/px · 7 of 24 slices shown]
[im 1/24]
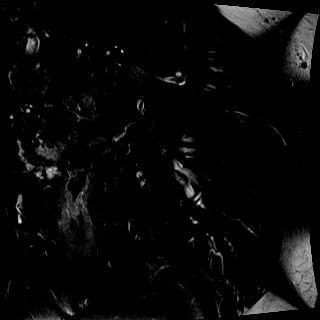
[im 4/24]
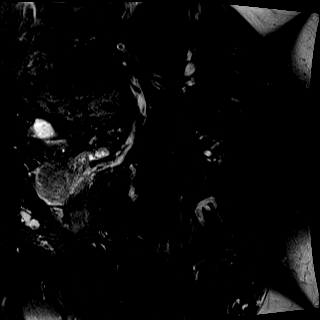
[im 8/24]
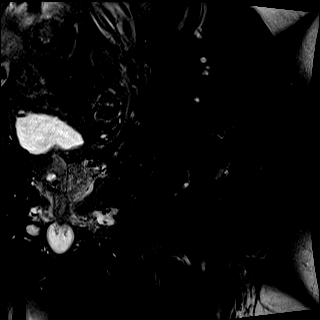
[im 12/24]
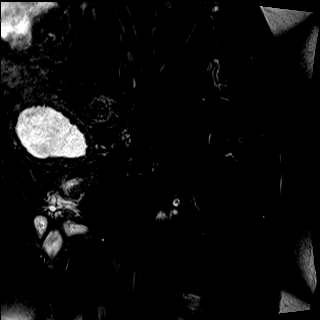
[im 16/24]
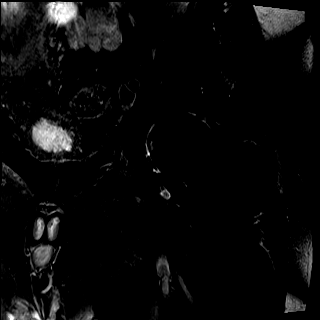
[im 20/24]
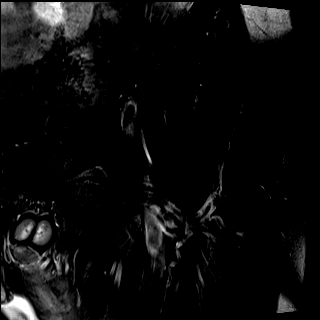
[im 24/24]
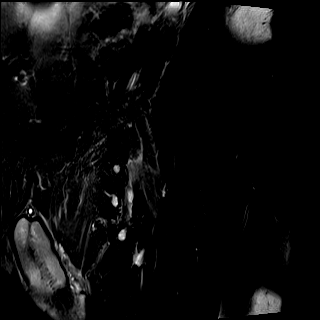

[Series 7: t2_sag_fs · sagittal · 5.0mm · 0.59mm/px · 5 of 18 slices shown]
[im 1/18]
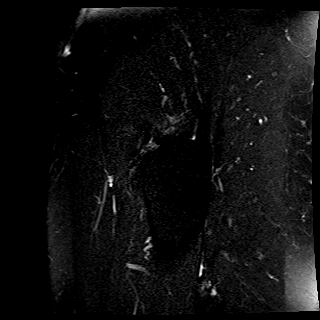
[im 5/18]
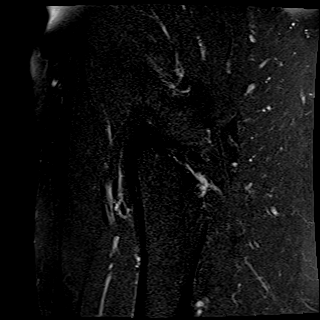
[im 9/18]
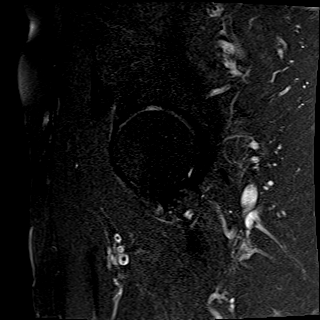
[im 13/18]
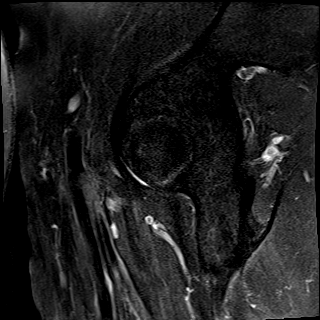
[im 18/18]
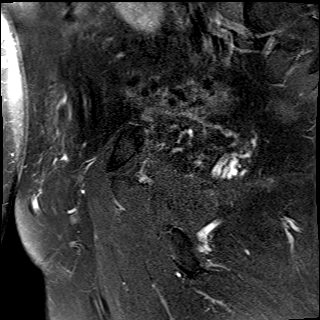

[40 of 40 positions shown; findings below may reference images not displayed]

FINDINGS: OSSEOUS: No acute fracture, avascular necrosis or aggressive osseous lesion. Mild left sacroiliac joint osteoarthritis. The pubic symphysis is unremarkable in appearance.

HIP JOINT: 

Labrum: Intact.

Articular cartilage: A 14 x 8 mm region of deep partial-thickness chondrosis involving the superior and posterior superior femoroacetabular articular cartilage. No underlying reactive osseous change, small marginal osteophytes.

Effusion: None.

GLUTEAL TENDONS AND GREATER TROCHANTERIC BURSA:

Gluteus medius tendon: Mild tendinosis, with superimposed small low-grade partial-thickness tearing of the anterior tendon insertion.

Gluteus minimus tendon: Intact.

Greater trochanteric bursa: Small volume fluid.

OTHER MUSCULOTENDINOUS STRUCTURES:

Rectus femoris tendon: Intact.

Iliopsoas tendon: Intact. Small volume fluid within the iliopsoas bursa.

Proximal hamstring tendon origin: Moderate tendinosis, with superimposed partial-thickness tearing tendon origin.

Intramuscular edema and fatty atrophy: None.

SCIATIC NERVE: The visualized course and caliber of the sciatic nerve is unremarkable.

OTHER: Diverticulosis of the descending and sigmoid colon. The central gland of the prostate is enlarged with mass effect upon the base of the urinary bladder. Thickening of the urinary bladder wall may be secondary to underdistention. The remaining visualized intrapelvic contents are unremarkable in appearance.
IMPRESSION: 1.
Mild to moderate left hip chondrosis. Intact acetabular labrum.

2.
Mild gluteus medius tendinosis, with small low-grade partial-thickness tear of the anterior tendon insertion. Small volume fluid within the greater trochanteric bursa. Correlate for signs of bursitis.

3.
Moderate tendinosis and partial-thickness tearing of the proximal hamstring tendon origin.

## 2021-03-06 IMAGING — MR MRI HIP RT WO CONTRAST
5 series · 40 of 40 positions shown · non-contrast
Comparison: None available.

INDICATION: Right hip pain
TECHNIQUE: Multiplanar, multisequence imaging of the right hip was performed without contrast.

[Series 3: t1_axial · axial · 4.0mm · 0.69mm/px · z∈[-83,+87]mm · 11 of 35 slices shown]
[im 1/35]
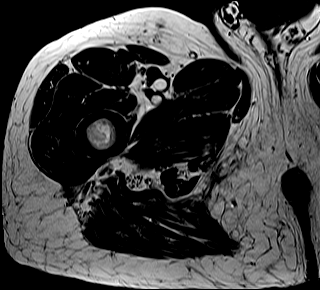
[im 4/35]
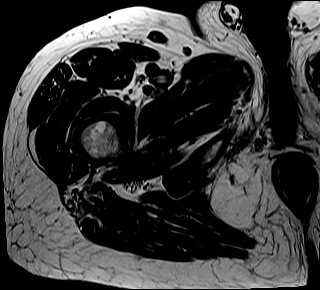
[im 7/35]
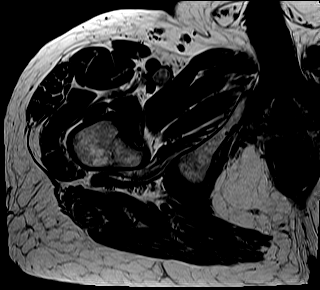
[im 11/35]
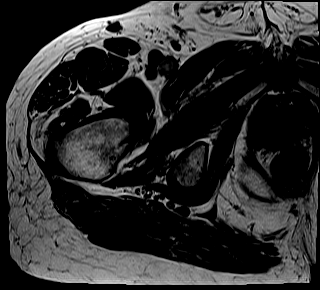
[im 14/35]
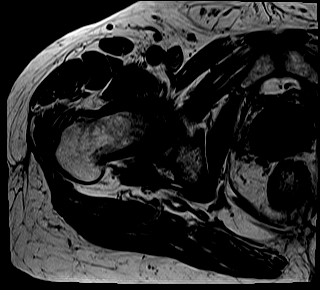
[im 18/35]
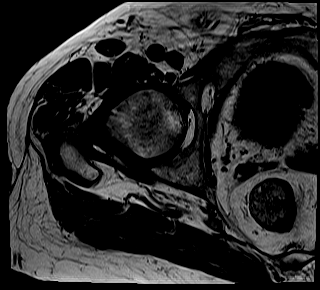
[im 21/35]
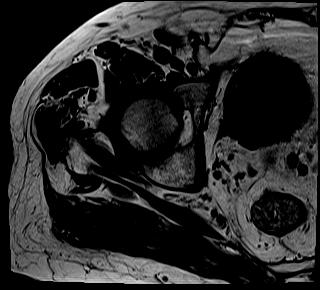
[im 24/35]
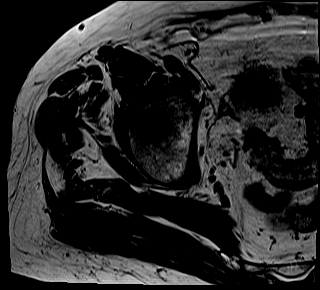
[im 28/35]
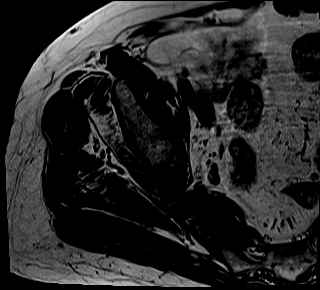
[im 31/35]
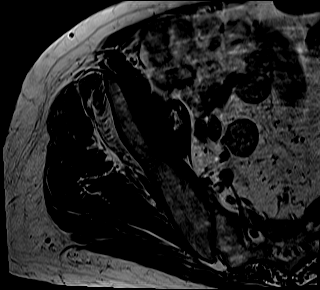
[im 35/35]
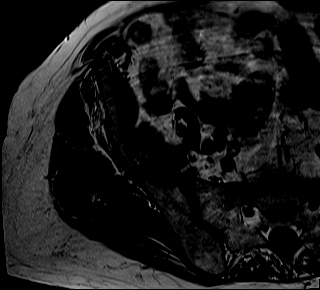

[Series 4: t2_axial_fs · axial · 4.0mm · 0.78mm/px · z∈[-83,+87]mm · 10 of 35 slices shown]
[im 1/35]
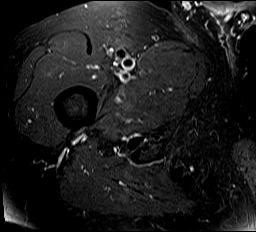
[im 4/35]
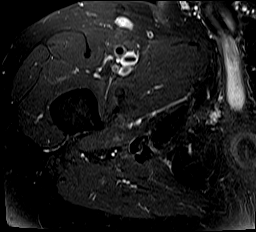
[im 8/35]
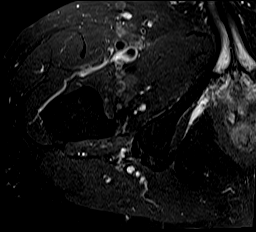
[im 12/35]
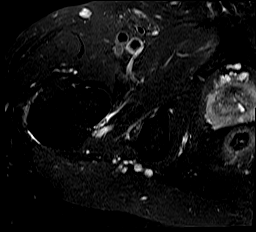
[im 16/35]
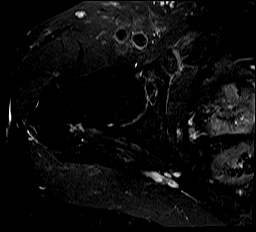
[im 19/35]
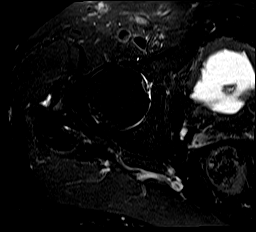
[im 23/35]
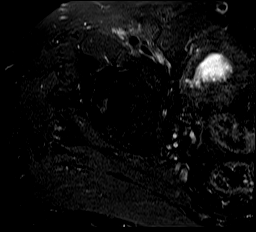
[im 27/35]
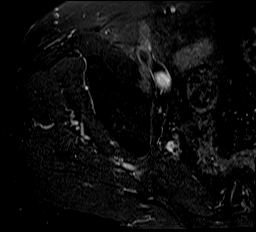
[im 31/35]
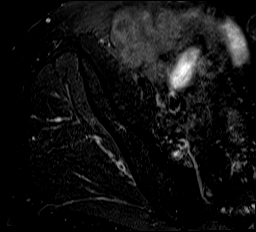
[im 35/35]
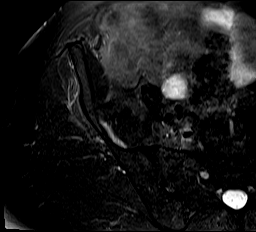

[Series 5: t1_cor · coronal · 4.0mm · 0.75mm/px · 7 of 24 slices shown]
[im 1/24]
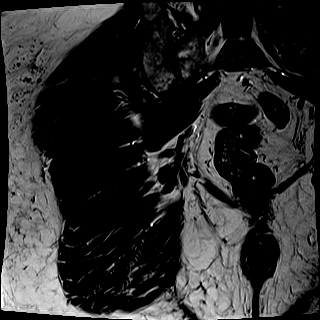
[im 4/24]
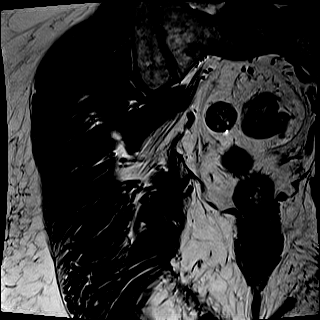
[im 8/24]
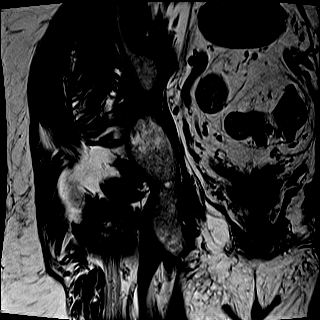
[im 12/24]
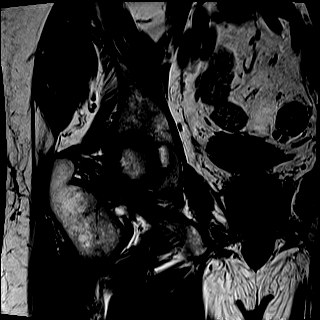
[im 16/24]
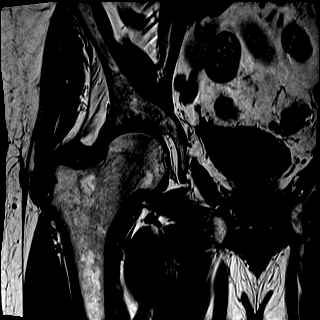
[im 20/24]
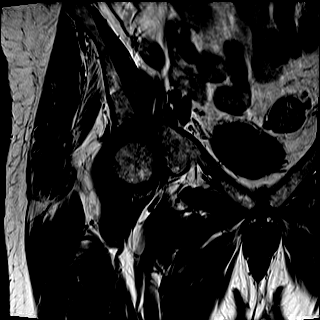
[im 24/24]
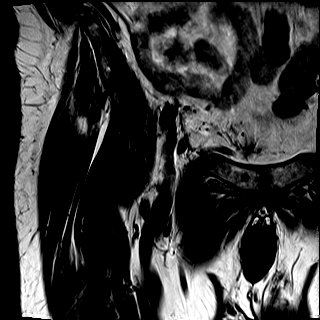

[Series 6: t2_cor_fs · coronal · 4.0mm · 0.75mm/px · 7 of 24 slices shown]
[im 1/24]
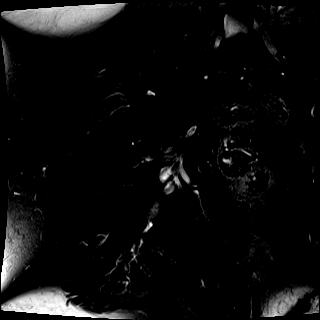
[im 4/24]
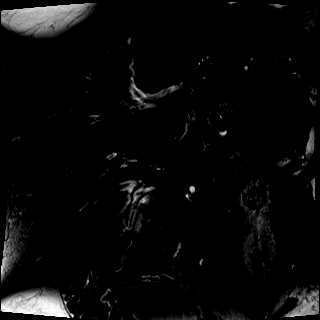
[im 8/24]
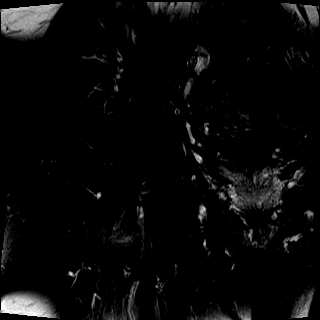
[im 12/24]
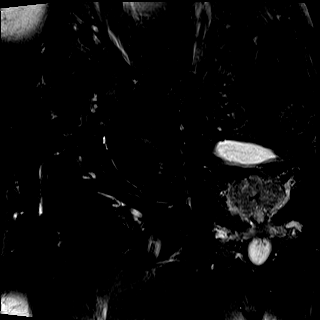
[im 16/24]
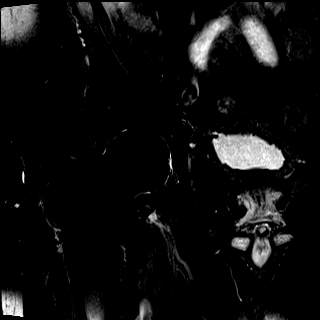
[im 20/24]
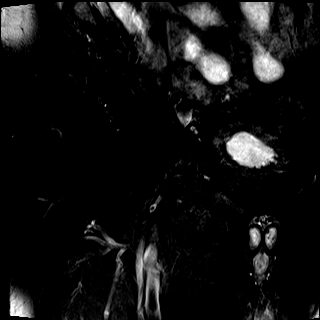
[im 24/24]
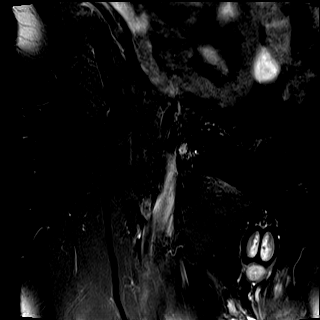

[Series 7: t2_sag_fs · sagittal · 5.0mm · 0.59mm/px · 5 of 18 slices shown]
[im 1/18]
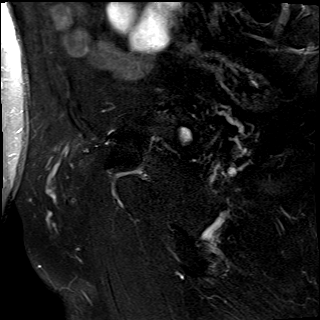
[im 5/18]
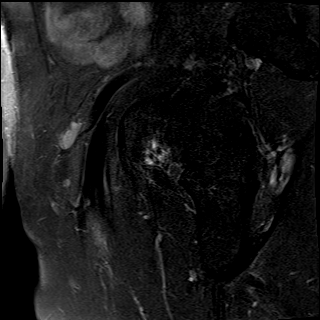
[im 9/18]
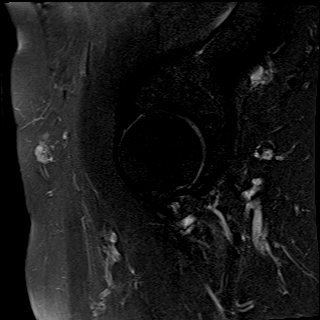
[im 13/18]
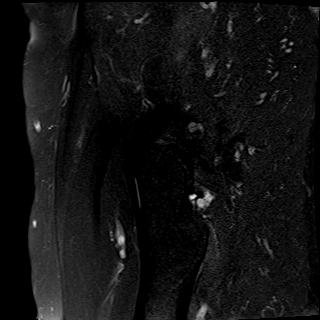
[im 18/18]
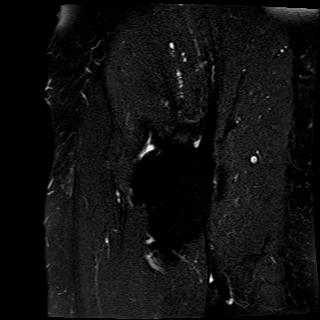

[40 of 40 positions shown; findings below may reference images not displayed]

FINDINGS: OSSEOUS: No acute fracture, avascular necrosis or aggressive osseous lesion. Mild right sacroiliac joint osteoarthritis.

HIP JOINT: 

Labrum: Degenerative intermediate signal in the superior and posterior superior acetabular labrum. No fluid-filled labral cleft or paravertebral cyst.

Articular cartilage: Irregular deep partial-thickness chondrosis of the superior femoroacetabular articular cartilage. Small foci of underlying reactive subchondral marrow edema. Small marginal osteophytes.

Effusion: None.

GLUTEAL TENDONS AND GREATER TROCHANTERIC BURSA:

Gluteus medius tendon: Mild tendinosis.

Gluteus minimus tendon: Mild tendinosis, with partial-thickness tearing at the tendon insertion.

Greater trochanteric bursa: Small volume fluid.

OTHER MUSCULOTENDINOUS STRUCTURES:

Rectus femoris tendon: Intact.

Iliopsoas tendon: Intact.

Proximal hamstring tendon origin: Mild to moderate tendinosis, with small partial-thickness tear of the semimembranosus tendon origin.

Intramuscular edema and fatty atrophy: Moderate fatty atrophy of the gluteus minimus muscle. Narrowing of the ischiofemoral space, with mild edema and fatty atrophy within the quadratus femoris muscle.

SCIATIC NERVE: The visualized course and caliber of the sciatic nerve is unremarkable.

OTHER: Diverticulosis of the sigmoid colon.
IMPRESSION: 1.
Mild to moderate right hip osteoarthritis, with degeneration of the superior and posterior superior acetabular labrum.

2.
Mild gluteus medius and minimus tendinosis, with partial-thickness tearing of the gluteus minimus tendon insertion.

3.
Small volume fluid in the greater trochanteric bursa. Correlate with clinical signs of bursitis.

4.
Narrowing of the ischiofemoral space, with mild edema and fatty atrophy in the quadratus femoris muscle. Correlate with clinical signs of ischiofemoral impingement.
